# Patient Record
Sex: Female | Born: 2000 | Race: Black or African American | Hispanic: No | Marital: Single | Smoking: Never smoker
Health system: Southern US, Community
[De-identification: ages and names within clinical notes are randomized; demographics above are authoritative.]

## PROBLEM LIST (undated history)

## (undated) DIAGNOSIS — D571 Sickle-cell disease without crisis: Secondary | ICD-10-CM

## (undated) DIAGNOSIS — C801 Malignant (primary) neoplasm, unspecified: Secondary | ICD-10-CM

## (undated) DIAGNOSIS — J9859 Other diseases of mediastinum, not elsewhere classified: Secondary | ICD-10-CM

## (undated) DIAGNOSIS — R162 Hepatomegaly with splenomegaly, not elsewhere classified: Secondary | ICD-10-CM

## (undated) DIAGNOSIS — B54 Unspecified malaria: Secondary | ICD-10-CM

## (undated) HISTORY — DX: Other diseases of mediastinum, not elsewhere classified: J98.59

## (undated) HISTORY — PX: HERNIA REPAIR: SHX51

## (undated) HISTORY — PX: CHOLECYSTECTOMY: SHX55

## (undated) HISTORY — DX: Malignant (primary) neoplasm, unspecified: C80.1

## (undated) HISTORY — DX: Unspecified malaria: B54

## (undated) HISTORY — PX: DG GALL BLADDER: HXRAD326

## (undated) HISTORY — PX: SPLENECTOMY, TOTAL: SHX788

## (undated) HISTORY — PX: SPLENECTOMY: SUR1306

## (undated) HISTORY — DX: Hepatomegaly with splenomegaly, not elsewhere classified: R16.2

---
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
## 2021-02-01 DIAGNOSIS — R109 Unspecified abdominal pain: Secondary | ICD-10-CM | POA: Diagnosis not present

## 2021-02-01 DIAGNOSIS — K5900 Constipation, unspecified: Secondary | ICD-10-CM | POA: Diagnosis not present

## 2021-02-01 DIAGNOSIS — G43909 Migraine, unspecified, not intractable, without status migrainosus: Secondary | ICD-10-CM | POA: Diagnosis not present

## 2021-02-01 DIAGNOSIS — G8929 Other chronic pain: Secondary | ICD-10-CM | POA: Diagnosis not present

## 2021-02-01 DIAGNOSIS — D571 Sickle-cell disease without crisis: Secondary | ICD-10-CM | POA: Diagnosis not present

## 2021-02-01 DIAGNOSIS — E559 Vitamin D deficiency, unspecified: Secondary | ICD-10-CM | POA: Diagnosis not present

## 2021-03-08 ENCOUNTER — Inpatient Hospital Stay (HOSPITAL_COMMUNITY)
Admission: EM | Admit: 2021-03-08 | Discharge: 2021-03-13 | DRG: 812 | Disposition: A | Discharge status: Home / Self Care | Payer: Medicaid Other | Attending: Internal Medicine | Admitting: Internal Medicine

## 2021-03-08 ENCOUNTER — Emergency Department (HOSPITAL_COMMUNITY): Payer: Medicaid Other

## 2021-03-08 ENCOUNTER — Encounter (HOSPITAL_COMMUNITY): Payer: Self-pay

## 2021-03-08 DIAGNOSIS — Z9049 Acquired absence of other specified parts of digestive tract: Secondary | ICD-10-CM

## 2021-03-08 DIAGNOSIS — Z20822 Contact with and (suspected) exposure to covid-19: Secondary | ICD-10-CM | POA: Diagnosis present

## 2021-03-08 DIAGNOSIS — Z9081 Acquired absence of spleen: Secondary | ICD-10-CM

## 2021-03-08 DIAGNOSIS — D5700 Hb-SS disease with crisis, unspecified: Principal | ICD-10-CM | POA: Diagnosis present

## 2021-03-08 DIAGNOSIS — R079 Chest pain, unspecified: Secondary | ICD-10-CM | POA: Diagnosis not present

## 2021-03-08 DIAGNOSIS — B9620 Unspecified Escherichia coli [E. coli] as the cause of diseases classified elsewhere: Secondary | ICD-10-CM

## 2021-03-08 DIAGNOSIS — D638 Anemia in other chronic diseases classified elsewhere: Secondary | ICD-10-CM

## 2021-03-08 DIAGNOSIS — I517 Cardiomegaly: Secondary | ICD-10-CM | POA: Diagnosis not present

## 2021-03-08 DIAGNOSIS — Z79899 Other long term (current) drug therapy: Secondary | ICD-10-CM

## 2021-03-08 DIAGNOSIS — D72829 Elevated white blood cell count, unspecified: Secondary | ICD-10-CM

## 2021-03-08 DIAGNOSIS — R0789 Other chest pain: Secondary | ICD-10-CM | POA: Diagnosis not present

## 2021-03-08 DIAGNOSIS — M549 Dorsalgia, unspecified: Secondary | ICD-10-CM | POA: Diagnosis not present

## 2021-03-08 DIAGNOSIS — R000 Tachycardia, unspecified: Secondary | ICD-10-CM | POA: Diagnosis not present

## 2021-03-08 DIAGNOSIS — D571 Sickle-cell disease without crisis: Secondary | ICD-10-CM | POA: Diagnosis present

## 2021-03-08 DIAGNOSIS — R059 Cough, unspecified: Secondary | ICD-10-CM | POA: Diagnosis not present

## 2021-03-08 DIAGNOSIS — M545 Low back pain, unspecified: Secondary | ICD-10-CM | POA: Diagnosis not present

## 2021-03-08 DIAGNOSIS — N390 Urinary tract infection, site not specified: Secondary | ICD-10-CM

## 2021-03-08 LAB — RETICULOCYTES
Immature Retic Fract: 29.9 % — ABNORMAL HIGH (ref 2.3–15.9)
RBC.: 1.95 MIL/uL — ABNORMAL LOW (ref 3.87–5.11)
Retic Count, Absolute: 276.1 10*3/uL — ABNORMAL HIGH (ref 19.0–186.0)
Retic Ct Pct: 14.2 % — ABNORMAL HIGH (ref 0.4–3.1)

## 2021-03-08 LAB — COMPREHENSIVE METABOLIC PANEL
ALT: 13 U/L (ref 0–44)
AST: 43 U/L — ABNORMAL HIGH (ref 15–41)
Albumin: 4.1 g/dL (ref 3.5–5.0)
Alkaline Phosphatase: 82 U/L (ref 38–126)
Anion gap: 7 (ref 5–15)
BUN: <5 mg/dL — ABNORMAL LOW (ref 6–20)
CO2: 19 mmol/L — ABNORMAL LOW (ref 22–32)
Calcium: 8.8 mg/dL — ABNORMAL LOW (ref 8.9–10.3)
Chloride: 112 mmol/L — ABNORMAL HIGH (ref 98–111)
Creatinine, Ser: 0.41 mg/dL — ABNORMAL LOW (ref 0.44–1.00)
GFR, Estimated: >60 mL/min (ref >60)
Glucose, Bld: 112 mg/dL — ABNORMAL HIGH (ref 70–99)
Potassium: 3.8 mmol/L (ref 3.5–5.1)
Sodium: 138 mmol/L (ref 135–145)
Total Bilirubin: 2.1 mg/dL — ABNORMAL HIGH (ref 0.3–1.2)
Total Protein: 7.6 g/dL (ref 6.5–8.1)

## 2021-03-08 LAB — CBC WITH DIFFERENTIAL/PLATELET
Abs Immature Granulocytes: 0.24 10*3/uL — ABNORMAL HIGH (ref 0.00–0.07)
Basophils Absolute: 0.1 10*3/uL (ref 0.0–0.1)
Basophils Relative: 0 %
Eosinophils Absolute: 0 10*3/uL (ref 0.0–0.5)
Eosinophils Relative: 0 %
HCT: 16.8 % — ABNORMAL LOW (ref 36.0–46.0)
Hemoglobin: 6 g/dL — CL (ref 12.0–15.0)
Immature Granulocytes: 1 %
Lymphocytes Relative: 9 %
Lymphs Abs: 1.7 10*3/uL (ref 0.7–4.0)
MCH: 30.8 pg (ref 26.0–34.0)
MCHC: 35.7 g/dL (ref 30.0–36.0)
MCV: 86.2 fL (ref 80.0–100.0)
Monocytes Absolute: 1.2 10*3/uL — ABNORMAL HIGH (ref 0.1–1.0)
Monocytes Relative: 6 %
Neutro Abs: 15.5 10*3/uL — ABNORMAL HIGH (ref 1.7–7.7)
Neutrophils Relative %: 84 %
Platelets: 419 10*3/uL — ABNORMAL HIGH (ref 150–400)
RBC: 1.95 MIL/uL — ABNORMAL LOW (ref 3.87–5.11)
RDW: 27 % — ABNORMAL HIGH (ref 11.5–15.5)
WBC: 18.7 10*3/uL — ABNORMAL HIGH (ref 4.0–10.5)
nRBC: 3.2 % — ABNORMAL HIGH (ref 0.0–0.2)

## 2021-03-08 LAB — RESP PANEL BY RT-PCR (FLU A&B, COVID) ARPGX2
Influenza A by PCR: NEGATIVE
Influenza B by PCR: NEGATIVE
SARS Coronavirus 2 by RT PCR: NEGATIVE

## 2021-03-08 LAB — I-STAT BETA HCG BLOOD, ED (MC, WL, AP ONLY): I-stat hCG, quantitative: <5 m[IU]/mL (ref <5)

## 2021-03-08 MED ORDER — MORPHINE SULFATE (PF) 4 MG/ML IV SOLN
8.0000 mg | Freq: Once | INTRAVENOUS | Status: AC
Start: 2021-03-08 — End: 2021-03-08
  Administered 2021-03-08: 8 mg via INTRAVENOUS
  Filled 2021-03-08: qty 2

## 2021-03-08 MED ORDER — MORPHINE SULFATE (PF) 4 MG/ML IV SOLN
8.0000 mg | Freq: Once | INTRAVENOUS | Status: AC
  Administered 2021-03-08: 8 mg via INTRAVENOUS
  Filled 2021-03-08: qty 2

## 2021-03-08 MED ORDER — MORPHINE SULFATE (PF) 4 MG/ML IV SOLN
6.0000 mg | INTRAVENOUS | Status: AC
  Administered 2021-03-08: 6 mg via INTRAVENOUS
  Filled 2021-03-08: qty 2

## 2021-03-08 MED ORDER — ONDANSETRON HCL 4 MG/2ML IJ SOLN
4.0000 mg | INTRAMUSCULAR | Status: DC | PRN
  Administered 2021-03-08: 4 mg via INTRAVENOUS
  Filled 2021-03-08: qty 2

## 2021-03-08 MED ORDER — MORPHINE SULFATE (PF) 4 MG/ML IV SOLN
8.0000 mg | INTRAVENOUS | Status: AC
  Administered 2021-03-08: 8 mg via INTRAVENOUS
  Filled 2021-03-08: qty 2

## 2021-03-08 MED ORDER — DEXTROSE-NACL 5-0.45 % IV SOLN: INTRAVENOUS | Status: DC

## 2021-03-08 MED ORDER — SODIUM CHLORIDE 0.9 % IV SOLN
12.5000 mg | Freq: Once | INTRAVENOUS | Status: AC
  Administered 2021-03-08: 12.5 mg via INTRAVENOUS
  Filled 2021-03-08: qty 12.5

## 2021-03-08 NOTE — ED Notes (Signed)
Date and time results received: 03/08/21 2251 (use smartphrase ".now" to insert current time)  Test: hemoglobin Critical Value: 6.0  Name of Provider Notified: Dr. Sherry Ruffing  Orders Received? Or Actions Taken?: no/na

## 2021-03-08 NOTE — ED Provider Notes (Signed)
[facility] DEPT Provider Note   CSN: [id number] Arrival date & time: 03/08/21  1818     History  Chief Complaint  Patient presents with   Sickle Cell Pain Crisis    Alicia Villegas is a 21 y.o. female.  The history is provided by the patient and medical records. No language interpreter was used.  Sickle Cell Pain Crisis Location:  Chest and back Severity:  Severe Onset quality:  Gradual Duration:  2 days Similar to previous crisis episodes: yes   Timing:  Constant Progression:  Worsening Chronicity:  Recurrent Relieved by:  Nothing Worsened by:  Nothing Ineffective treatments:  None tried Associated symptoms: chest pain   Associated symptoms: no congestion, no cough, no fatigue, no fever, no headaches, no nausea, no shortness of breath, no vomiting and no wheezing       Home Medications Prior to Admission medications   Medication Sig Start Date End Date Taking? Authorizing Provider  acetaminophen (TYLENOL) 500 MG tablet Take by mouth. 03/29/20   [provider]  Cholecalciferol 25 MCG (1000 UT) capsule Take by mouth. 03/04/20   [provider]  dicyclomine (BENTYL) 10 MG capsule Take by mouth. 01/11/20   [provider]  ergocalciferol (VITAMIN D2) 1.25 MG (50000 UT) capsule Take by mouth. 03/28/20   [provider]  fluconazole (DIFLUCAN) 150 MG tablet Take 1 tablet (150 mg total) by mouth daily. 05/09/20   Daisy Floro, DO  hydroxyurea (HYDREA) 100 mg/mL SUSP Take 1,000 mg by mouth every evening.    [provider]  hydroxyurea (HYDREA) oral suspension 100 mg/ml mixture Take by mouth. 03/27/20   [provider]  Lactobacillus Rhamnosus, GG, (CULTURELLE KIDS) CHEW Chew by mouth. 10/31/20   [provider]  penicillin v potassium (VEETID) 250 MG tablet Take 250 mg by mouth 2 (two) times daily. 02/08/16   [provider]  propranolol (INDERAL) 10 MG tablet Take 10 mg by mouth 2  (two) times daily. 01/06/20   [provider]  voxelotor (OXBRYTA) 500 MG TABS tablet Take 3 tablets by mouth daily. 10/19/20   [provider]      Allergies    Patient has no known allergies.    Review of Systems   Review of Systems  Constitutional:  Negative for chills, diaphoresis, fatigue and fever.  HENT:  Negative for congestion.   Respiratory:  Negative for cough, choking, chest tightness, shortness of breath and wheezing.   Cardiovascular:  Positive for chest pain. Negative for palpitations.  Gastrointestinal:  Negative for abdominal pain, constipation, diarrhea, nausea and vomiting.  Genitourinary:  Negative for dysuria and flank pain.  Musculoskeletal:  Positive for back pain. Negative for neck pain and neck stiffness.  Skin:  Negative for rash and wound.  Neurological:  Negative for light-headedness and headaches.  Psychiatric/Behavioral:  Negative for agitation and confusion.   All other systems reviewed and are negative.  Physical Exam Updated Vital Signs BP (!) 140/94 (BP Location: Right Arm)    Pulse (!) 111    Temp 99 F (37.2 C) (Oral)    Resp 18    SpO2 100%  Physical Exam Vitals and nursing note reviewed.  Constitutional:      General: She is not in acute distress.    Appearance: She is well-developed. She is not ill-appearing, toxic-appearing or diaphoretic.  HENT:     Head: Normocephalic and atraumatic.     Nose: No congestion.     Mouth/Throat:  Mouth: Mucous membranes are moist.     Pharynx: No oropharyngeal exudate.  Eyes:     Conjunctiva/sclera: Conjunctivae normal.     Pupils: Pupils are equal, round, and reactive to light.  Cardiovascular:     Rate and Rhythm: Regular rhythm. Tachycardia present.     Heart sounds: No murmur heard. Pulmonary:     Effort: Pulmonary effort is normal. No respiratory distress.     Breath sounds: Normal breath sounds. No wheezing, rhonchi or rales.  Chest:     Chest wall: Tenderness present.   Abdominal:     General: Abdomen is flat.     Palpations: Abdomen is soft.     Tenderness: There is no abdominal tenderness. There is no right CVA tenderness, left CVA tenderness, guarding or rebound.  Musculoskeletal:        General: Tenderness present. No swelling.     Cervical back: Neck supple. No tenderness.  Skin:    General: Skin is warm and dry.     Capillary Refill: Capillary refill takes less than 2 seconds.     Findings: No erythema or rash.  Neurological:     General: No focal deficit present.     Mental Status: She is alert.     Sensory: No sensory deficit.     Motor: No weakness.  Psychiatric:        Mood and Affect: Mood normal.    ED Results / Procedures / Treatments   Labs (all labs ordered are listed, but only abnormal results are displayed) Labs Reviewed  CBC WITH DIFFERENTIAL/PLATELET - Abnormal; Notable for the following components:      Result Value   WBC 18.7 (*)    RBC 1.95 (*)    Hemoglobin 6.0 (*)    HCT 16.8 (*)    RDW 27.0 (*)    Platelets 419 (*)    nRBC 3.2 (*)    Neutro Abs 15.5 (*)    Monocytes Absolute 1.2 (*)    Abs Immature Granulocytes 0.24 (*)    All other components within normal limits  RETICULOCYTES - Abnormal; Notable for the following components:   Retic Ct Pct 14.2 (*)    RBC. 1.95 (*)    Retic Count, Absolute 276.1 (*)    Immature Retic Fract 29.9 (*)    All other components within normal limits  COMPREHENSIVE METABOLIC PANEL - Abnormal; Notable for the following components:   Chloride 112 (*)    CO2 19 (*)    Glucose, Bld 112 (*)    BUN <5 (*)    Creatinine, Ser 0.41 (*)    Calcium 8.8 (*)    AST 43 (*)    Total Bilirubin 2.1 (*)    All other components within normal limits  RESP PANEL BY RT-PCR (FLU A&B, COVID) ARPGX2  URINE CULTURE  CBC WITH DIFFERENTIAL/PLATELET  URINALYSIS, ROUTINE W REFLEX MICROSCOPIC  I-STAT BETA HCG BLOOD, ED ([facility], [facility], [facility] ONLY)    EKG EKG Interpretation  Date/Time:  Wednesday March 08 2021 20:32:40 EST Ventricular Rate:  103 PR Interval:  173 QRS Duration: 81 QT Interval:  330 QTC Calculation: 432 R Axis:   76 Text Interpretation: Sinus tachycardia when compared to prior, faster rate. No STEMI Confirmed by Antony Blackbird [phone]) on 03/08/2021 9:38:02 PM  Radiology DG Chest 2 View  Result Date: 03/08/2021 CLINICAL DATA:  Cough, sickle cell. EXAM: CHEST - 2 VIEW COMPARISON:  Chest x-ray 02/21/2018. FINDINGS: Heart is mildly enlarged, unchanged. The lungs are  clear. There is no pleural effusion or pneumothorax identified. The osseous structures are within normal limits. IMPRESSION: 1. No acute cardiopulmonary process. 2. Stable cardiomegaly. Electronically Signed   By: Ronney Asters M.D.   On: 03/08/2021 19:36    Procedures Procedures    Medications Ordered in ED Medications  ondansetron (ZOFRAN) injection 4 mg (4 mg Intravenous Given 03/08/21 2000)  dextrose 5 %-0.45 % sodium chloride infusion ( Intravenous New Bag/Given 03/08/21 2035)  morphine (PF) 4 MG/ML injection 8 mg (has no administration in time range)  morphine (PF) 4 MG/ML injection 6 mg (6 mg Intravenous Given 03/08/21 1959)  morphine (PF) 4 MG/ML injection 8 mg (8 mg Intravenous Given 03/08/21 2035)  diphenhydrAMINE (BENADRYL) 12.5 mg in sodium chloride 0.9 % 50 mL IVPB (0 mg Intravenous Stopped 03/08/21 2027)  morphine (PF) 4 MG/ML injection 8 mg (8 mg Intravenous Given 03/08/21 2134)    ED Course/ Medical Decision Making/ A&P                           Medical Decision Making Amount and/or Complexity of Data Reviewed Labs: ordered.  Risk Prescription drug management. Decision regarding hospitalization.   Alicia Villegas is a 21 y.o. female with a past medical history significant for sickle cell anemia, previous malaria, previous mediastinal mass, and migraines who presents with pain.  According to patient, she has had pain in her low back and her chest for the last few days consistent with previous sickle  cell pain crisis.  She reports it has been several months that she has had knee pain.  She thinks it could be related to the temperature and environmental changes recently.  She says that her medications have not been helping and she is having 10 out of 10 pain in her low back.  She reports the pain in her chest is only 4 out of 10.  She denies any history of DVT, PE, and reports does not feel like a pneumonia.  She denies any congestion, cough.  She denies any fevers or chills.  She was unable to answer about any sick contacts.  She denies any constipation, diarrhea, or urinary changes although she is having pain in the bilateral flanks and low back.  She reports he is on her menstrual cycle currently and does not report any pelvic pains.  No nausea or vomiting reported.  On exam, patient is clearly uncomfortable.  She has tenderness across her low back and some mild tenderness on the chest.  Lungs were clear and no murmur present.  Good pulses in extremities.  Legs are nontender and nonedematous.  She is slightly tachycardic but oxygen saturations are in the 90s.  She is afebrile but temperature was 99 orally on arrival.  Patient reports that this feels similar to previous pain crises and she does not think something else is triggering it or causing at this time.  However, given the pain in the chest, will get chest x-ray and will get urinalysis to look for a UTI as the cause of the back pains.  We will give pain medicine and patient reports that morphine typically helps her.  We will also get the screening sickle cell blood work.  Have a lower suspicion for thromboembolic etiology based on her description of symptoms and history.  Anticipate reassessment after work-up to determine disposition.  11:05 PM Work-up has continued to return.  Patient says that she urinated but it does not appear to  been collected.  We will try to make sure we get a urine sample sent although she denies urinary symptoms.   Patient's temperature is 99 but COVID and flu test were negative.  X-ray does not show pneumonia.  Oxygen saturations have for the most part remained above 90 although there was one value in the 80s which patient was denying shortness of breath.  Oxygen saturations are normal now.  Labs began to return and patient does have decreasing hemoglobin down to 6.0.  Previous admission shows that she has been between 6 and 7 in the past although this is on the lower side normal for her.  Patient is still having uncontrolled pain despite several doses of morphine.  Had a shared decision made conversation with her and she does not feel safe going home at this time.  She has not had to be admitted since last year.  We will call for admission for sickle cell pain crisis while still awaiting urinalysis.        Final Clinical Impression(s) / ED Diagnoses Final diagnoses:  Sickle cell pain crisis ([name])     Clinical Impression: 1. Sickle cell pain crisis ([place])     Disposition: Admit  This note was prepared with assistance of Dragon voice recognition software. Occasional wrong-word or sound-a-like substitutions may have occurred due to the inherent limitations of voice recognition software.      Babygirl Trager, Gwenyth Allegra, MD 03/08/21 2325

## 2021-03-08 NOTE — ED Provider Triage Note (Signed)
Emergency Medicine Provider Triage Evaluation Note  Alicia Villegas , a 21 y.o. female  was evaluated in triage.  Pt complains of lower back pain.  Denies chance of pregnancy.  No dysuria, hematuria.  Located in center of her back.  No recent falls or injuries.  Also does states she has a cough, taking her home sickle cell meds without relief.  Denies any overt chest pain, shortness of breath, lower extremity swelling.  No paresthesias or weakness.  Review of Systems  Positive: Sickle cell crisis, low back pain, cough Negative: fever  Physical Exam  There were no vitals taken for this visit. Gen:   Awake, no distress   Resp:  Normal effort  MSK:   Moves extremities without difficulty Other:    Medical Decision Making  Medically screening exam initiated at 6:25 PM.  Appropriate orders placed.  Alicia Villegas was informed that the remainder of the evaluation will be completed by another provider, this initial triage assessment does not replace that evaluation, and the importance of remaining in the ED until their evaluation is complete.  History of sickle cell, low back pain, cough   Ryen Rhames A, PA-C 03/08/21 1826

## 2021-03-08 NOTE — ED Triage Notes (Signed)
Pt arrived via EMS, from home, [facility]. Lower back pain.

## 2021-03-09 ENCOUNTER — Other Ambulatory Visit: Payer: Self-pay

## 2021-03-09 ENCOUNTER — Encounter (HOSPITAL_COMMUNITY): Payer: Self-pay | Admitting: Internal Medicine

## 2021-03-09 DIAGNOSIS — Z9049 Acquired absence of other specified parts of digestive tract: Secondary | ICD-10-CM | POA: Diagnosis not present

## 2021-03-09 DIAGNOSIS — R059 Cough, unspecified: Secondary | ICD-10-CM | POA: Diagnosis not present

## 2021-03-09 DIAGNOSIS — Z20822 Contact with and (suspected) exposure to covid-19: Secondary | ICD-10-CM | POA: Diagnosis not present

## 2021-03-09 DIAGNOSIS — Z79899 Other long term (current) drug therapy: Secondary | ICD-10-CM | POA: Diagnosis not present

## 2021-03-09 DIAGNOSIS — R000 Tachycardia, unspecified: Secondary | ICD-10-CM | POA: Diagnosis not present

## 2021-03-09 DIAGNOSIS — D5700 Hb-SS disease with crisis, unspecified: Secondary | ICD-10-CM | POA: Diagnosis not present

## 2021-03-09 DIAGNOSIS — N390 Urinary tract infection, site not specified: Secondary | ICD-10-CM | POA: Diagnosis not present

## 2021-03-09 DIAGNOSIS — D571 Sickle-cell disease without crisis: Secondary | ICD-10-CM | POA: Diagnosis present

## 2021-03-09 DIAGNOSIS — I517 Cardiomegaly: Secondary | ICD-10-CM | POA: Diagnosis not present

## 2021-03-09 DIAGNOSIS — Z9081 Acquired absence of spleen: Secondary | ICD-10-CM | POA: Diagnosis not present

## 2021-03-09 DIAGNOSIS — D638 Anemia in other chronic diseases classified elsewhere: Secondary | ICD-10-CM | POA: Diagnosis not present

## 2021-03-09 DIAGNOSIS — B9620 Unspecified Escherichia coli [E. coli] as the cause of diseases classified elsewhere: Secondary | ICD-10-CM | POA: Diagnosis not present

## 2021-03-09 LAB — PREPARE RBC (CROSSMATCH)

## 2021-03-09 LAB — HEMOGLOBIN AND HEMATOCRIT, BLOOD
HCT: 21.9 % — ABNORMAL LOW (ref 36.0–46.0)
Hemoglobin: 7.8 g/dL — ABNORMAL LOW (ref 12.0–15.0)

## 2021-03-09 LAB — URINALYSIS, ROUTINE W REFLEX MICROSCOPIC
Bilirubin Urine: NEGATIVE
Glucose, UA: NEGATIVE mg/dL
Ketones, ur: NEGATIVE mg/dL
Nitrite: NEGATIVE
Protein, ur: NEGATIVE mg/dL
Specific Gravity, Urine: 1.02 (ref 1.005–1.030)
pH: 6 (ref 5.0–8.0)

## 2021-03-09 LAB — URINALYSIS, MICROSCOPIC (REFLEX)

## 2021-03-09 LAB — HIV ANTIBODY (ROUTINE TESTING W REFLEX): HIV Screen 4th Generation wRfx: NONREACTIVE

## 2021-03-09 MED ORDER — HYDROXYUREA 100 MG/ML ORAL SUSPENSION
360.0000 mg | Freq: Every day | ORAL | Status: DC
Start: 2021-03-09 — End: 2021-03-09

## 2021-03-09 MED ORDER — MORPHINE SULFATE (PF) 4 MG/ML IV SOLN: 4.0000 mg | INTRAVENOUS | Status: DC | PRN

## 2021-03-09 MED ORDER — MORPHINE SULFATE (PF) 4 MG/ML IV SOLN
4.0000 mg | INTRAVENOUS | Status: DC | PRN
  Administered 2021-03-09 (×2): 4 mg via INTRAVENOUS
  Filled 2021-03-09 (×2): qty 1

## 2021-03-09 MED ORDER — MORPHINE SULFATE (PF) 4 MG/ML IV SOLN
4.0000 mg | INTRAVENOUS | Status: DC | PRN
  Administered 2021-03-09: 6 mg via INTRAVENOUS
  Administered 2021-03-09: 4 mg via INTRAVENOUS
  Filled 2021-03-09 (×2): qty 2

## 2021-03-09 MED ORDER — SODIUM CHLORIDE 0.9% IV SOLUTION: Freq: Once | INTRAVENOUS | Status: DC

## 2021-03-09 MED ORDER — VOXELOTOR 500 MG PO TABS
3.0000 | ORAL_TABLET | Freq: Every day | ORAL | Status: DC
  Administered 2021-03-09 – 2021-03-13 (×5): 1500 mg via ORAL

## 2021-03-09 MED ORDER — NALOXONE HCL 0.4 MG/ML IJ SOLN: 0.4000 mg | INTRAMUSCULAR | Status: DC | PRN

## 2021-03-09 MED ORDER — MORPHINE SULFATE 1 MG/ML IV SOLN PCA
INTRAVENOUS | Status: DC
  Administered 2021-03-10 (×2): 0 mg via INTRAVENOUS
  Administered 2021-03-11: 2 mg via INTRAVENOUS
  Filled 2021-03-09: qty 30

## 2021-03-09 MED ORDER — ACETAMINOPHEN 325 MG PO TABS
650.0000 mg | ORAL_TABLET | ORAL | Status: DC | PRN
  Administered 2021-03-09 – 2021-03-13 (×9): 650 mg via ORAL
  Filled 2021-03-09 (×9): qty 2

## 2021-03-09 MED ORDER — ENOXAPARIN SODIUM 40 MG/0.4ML IJ SOSY
40.0000 mg | PREFILLED_SYRINGE | INTRAMUSCULAR | Status: DC
  Administered 2021-03-10 – 2021-03-11 (×2): 40 mg via SUBCUTANEOUS
  Filled 2021-03-09 (×4): qty 0.4

## 2021-03-09 MED ORDER — HYDROXYUREA 100 MG/ML ORAL SUSPENSION: 20.0000 mg/kg | Freq: Every day | ORAL | Status: DC

## 2021-03-09 MED ORDER — POLYETHYLENE GLYCOL 3350 17 G PO PACK: 17.0000 g | PACK | Freq: Every day | ORAL | Status: DC | PRN

## 2021-03-09 MED ORDER — ONDANSETRON HCL 4 MG/2ML IJ SOLN: 4.0000 mg | Freq: Four times a day (QID) | INTRAMUSCULAR | Status: DC | PRN

## 2021-03-09 MED ORDER — ENSURE ENLIVE PO LIQD
237.0000 mL | Freq: Two times a day (BID) | ORAL | Status: DC
  Administered 2021-03-10: 237 mL via ORAL

## 2021-03-09 MED ORDER — SENNOSIDES-DOCUSATE SODIUM 8.6-50 MG PO TABS
1.0000 | ORAL_TABLET | Freq: Two times a day (BID) | ORAL | Status: DC
  Administered 2021-03-09 – 2021-03-13 (×6): 1 via ORAL
  Filled 2021-03-09 (×6): qty 1

## 2021-03-09 MED ORDER — SODIUM CHLORIDE 0.9% FLUSH: 9.0000 mL | INTRAVENOUS | Status: DC | PRN

## 2021-03-09 MED ORDER — KETOROLAC TROMETHAMINE 15 MG/ML IJ SOLN
15.0000 mg | Freq: Four times a day (QID) | INTRAMUSCULAR | Status: DC
  Administered 2021-03-09 (×2): 15 mg via INTRAVENOUS
  Filled 2021-03-09 (×4): qty 1

## 2021-03-09 MED ORDER — SODIUM CHLORIDE 0.45 % IV SOLN
INTRAVENOUS | Status: DC
Start: 2021-03-09 — End: 2021-03-09

## 2021-03-09 MED ORDER — HYDROXYUREA 100 MG/ML ORAL SUSPENSION
1200.0000 mg | Freq: Every day | ORAL | Status: DC
  Administered 2021-03-10 – 2021-03-13 (×4): 1200 mg via ORAL

## 2021-03-09 MED ORDER — SODIUM CHLORIDE 0.9 % IV SOLN
25.0000 mg | INTRAVENOUS | Status: DC | PRN
  Filled 2021-03-09: qty 0.5

## 2021-03-09 MED ORDER — DIPHENHYDRAMINE HCL 25 MG PO CAPS: 25.0000 mg | ORAL_CAPSULE | ORAL | Status: DC | PRN

## 2021-03-09 NOTE — ED Notes (Signed)
Pt c/o leg weakness while walking to restroom. Only able to stand on own for few second

## 2021-03-09 NOTE — H&P (Signed)
History and Physical    Patient: Alicia Villegas DOB: 24-Mar-2000 DOA: 03/08/2021 DOS: the patient was seen and examined on 03/09/2021 PCP: Dorena Dew, FNP  Patient coming from: Home  Chief Complaint:  Chief Complaint  Patient presents with   Sickle Cell Pain Crisis    HPI: Alicia Villegas is a 21 y.o. female with medical history significant of HGB SS disease, acute chest syndrome in Sept 2022, HGB at that time was 6.0, transfused at [facility].  Looks like her HGB usually runs > 7.  Pt presents to the ED with c/o chest and back pain.  Symptoms are similar to prior sickle cell crisis symptoms.  Symptoms are severe but not worse or different than prior sickle cell crises.  Denies SOB.  Is satting 90% on RA in the ED.  Review of Systems: As mentioned in the history of present illness. All other systems reviewed and are negative. 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 She has never used smokeless tobacco. She reports that she does not drink alcohol and does not use drugs.  No Known Allergies  Family History  Family history unknown: Yes    Prior to Admission medications   Medication Sig Start Date End Date Taking? Authorizing Provider  hydroxyurea (HYDREA) 100 mg/mL SUSP Take 1,200 mg by mouth daily.   Yes [provider]  voxelotor (OXBRYTA) 500 MG TABS tablet Take 3 tablets by mouth daily. 10/19/20  Yes [provider]    Physical Exam: Vitals:   03/09/21 0000 03/09/21 0022 03/09/21 0030 03/09/21 0100  BP: 114/76  127/61 111/73  Pulse: (!) 119  (!)  124 (!) 118  Resp: (!) 23  (!) 27 (!) 26  Temp:  99.1 F (37.3 C)    TempSrc:  Oral    SpO2: 95%  96% 96%   Constitutional: Pt uncomfortable / in pain Eyes: PERRL, lids and conjunctivae normal ENMT: Mucous membranes are moist. Posterior pharynx clear of any exudate or lesions.Normal dentition.  Neck: normal, supple, no masses, no thyromegaly Respiratory: clear to auscultation bilaterally, no wheezing, no crackles. Normal respiratory effort. No accessory muscle use.  Cardiovascular: Tachycardic Abdomen: no tenderness, no masses palpated. No hepatosplenomegaly. Bowel sounds positive.  Musculoskeletal: no clubbing / cyanosis. No joint deformity upper and lower extremities. Good ROM, no contractures. Normal muscle tone.  Skin: no rashes, lesions, ulcers. No induration Neurologic: CN 2-12 grossly intact. Sensation intact, DTR normal. Strength 5/5 in all 4.  Psychiatric: Normal judgment and insight. Alert and oriented x 3. Normal mood.    Data Reviewed:  WBC 18k, HGB 6.0, CXR neg for opacities / acute chest syndrome findings.  Assessment and Plan: * Sickle cell pain crisis ([place])- (present on admission) Pt tachy to 130s, anemia to 6.0, this is significantly lower than baseline.  Pt says symptoms are consistent with sickle cell crisis. Looks like they transfused her at [facility] in Sept 2022 when she was 6.0 although she did have acute chest at that time.  Will transfuse 2u PRBC. Sickle cell pathway Scheduled toradol PRN morphine until we can get her on morphine PCA Tele monitor given severe tachycardia. No evidence of infiltrate on CXR today IVF: will DC the D5half as I dont really want to dilute her any further (hoping they can find compatible blood shortly). Continue Oxbryta Hold hydrea given anemia H/H post transfusion.       Advance Care Planning:   Code Status: Full Code  Consults: None  Family Communication: No family in room  Severity of Illness: The appropriate patient  status for this patient is INPATIENT. Inpatient status is judged to be reasonable and necessary in order to provide the required intensity of service to ensure the patient's safety. The patient's presenting symptoms, physical exam findings, and initial radiographic and laboratory data in the context of their chronic comorbidities is felt to place them at high risk for further clinical deterioration. Furthermore, it is not anticipated that the patient will be medically stable for discharge from the hospital within 2 midnights of admission.   * I certify that at the point of admission it is my clinical judgment that the patient will require inpatient hospital care spanning beyond 2 midnights from the point of admission due to high intensity of service, high risk for further deterioration and high frequency of surveillance required.*  Author: Etta Quill., DO 03/09/2021 3:13 AM  For on call review [email or web address].

## 2021-03-09 NOTE — Assessment & Plan Note (Addendum)
Pt tachy to 130s, anemia to 6.0, this is significantly lower than baseline.  Pt says symptoms are consistent with sickle cell crisis. Looks like they transfused her at [facility] in Sept 2022 when she was 6.0 although she did have acute chest at that time. Will transfuse 2u PRBC. Sickle cell pathway Scheduled toradol PRN morphine until we can get her on morphine PCA Tele monitor given severe tachycardia. No evidence of infiltrate on CXR today IVF: will DC the D5half as I dont really want to dilute her any further (hoping they can find compatible blood shortly). Continue Oxbryta Hold hydrea given anemia H/H post transfusion.

## 2021-03-09 NOTE — Progress Notes (Signed)
Alicia Villegas is a 21 year old female with a medical history significant for sickle cell disease that was admitted overnight for sickle cell crisis.  Patient states that pain intensity increased suddenly and she was unable to control her with over-the-counter medications.  Currently, her pain intensity is 6/10.  She denies any headache, chest pain, urinary symptoms, nausea, vomiting, or diarrhea.  Care plan: Decrease morphine to 4 mg every 2 hours as needed while in the emergency department, will transition to PCA upon arrival to the unit. Will only transfuse 1 unit PRBCs and recheck CBC in AM.  Will reassess on 03/10/2021  [place], MSN, FNP-C Patient [place] [address] [place], [place] [address] [phone]

## 2021-03-09 NOTE — ED Notes (Signed)
Took home medications to pharmacy for dispensing. 87 tablets oxbryta and 364ml hydrea suspension. Pharmacy to confirm if hydrea stays in pharmacy or at bedside.

## 2021-03-09 NOTE — ED Notes (Signed)
Spoke with blood bank regarding pt's ordered units. Per blood bank, this is still in process.

## 2021-03-09 NOTE — ED Notes (Signed)
RN called Blood bank to check on units. She has many antibodies and sickler so may have to get it from [place].

## 2021-03-09 NOTE — ED Notes (Signed)
Pt received dinner tray.

## 2021-03-09 NOTE — ED Notes (Signed)
Per lab, pt has numerous antibodies and blood had to come from [place]. Recommended 1 unit of PRBCs and then check an H&H 2 hours post-transfusion. Will message MD.

## 2021-03-09 NOTE — ED Notes (Signed)
Pt ambulatory to bathroom w/ assist for safety

## 2021-03-09 NOTE — Plan of Care (Signed)
°  Problem: Education: Goal: Knowledge of General Education information will improve Description: Including pain rating scale, medication(s)/side effects and non-pharmacologic comfort measures Outcome: Progressing   Problem: Health Behavior/Discharge Planning: Goal: Ability to manage health-related needs will improve Outcome: Progressing   Problem: Clinical Measurements: Goal: Ability to maintain clinical measurements within normal limits will improve Outcome: Progressing Goal: Will remain free from infection Outcome: Progressing Goal: Diagnostic test results will improve Outcome: Progressing Goal: Respiratory complications will improve Outcome: Progressing   Problem: Pain Managment: Goal: General experience of comfort will improve Outcome: Progressing

## 2021-03-10 DIAGNOSIS — D638 Anemia in other chronic diseases classified elsewhere: Secondary | ICD-10-CM

## 2021-03-10 DIAGNOSIS — D72829 Elevated white blood cell count, unspecified: Secondary | ICD-10-CM

## 2021-03-10 DIAGNOSIS — N390 Urinary tract infection, site not specified: Secondary | ICD-10-CM

## 2021-03-10 DIAGNOSIS — B9620 Unspecified Escherichia coli [E. coli] as the cause of diseases classified elsewhere: Secondary | ICD-10-CM

## 2021-03-10 DIAGNOSIS — D5700 Hb-SS disease with crisis, unspecified: Secondary | ICD-10-CM | POA: Diagnosis not present

## 2021-03-10 LAB — BPAM RBC
Blood Product Expiration Date: 202303042359
ISSUE DATE / TIME: 202302021648
Unit Type and Rh: 9500

## 2021-03-10 LAB — TYPE AND SCREEN
ABO/RH(D): AB POS
Antibody Screen: POSITIVE
Unit division: 0

## 2021-03-10 MED ORDER — SODIUM CHLORIDE 0.9 % IV SOLN
1.0000 g | INTRAVENOUS | Status: DC
  Administered 2021-03-10 – 2021-03-11 (×2): 1 g via INTRAVENOUS
  Filled 2021-03-10 (×2): qty 10

## 2021-03-10 MED ORDER — ENSURE ENLIVE PO LIQD: 237.0000 mL | Freq: Two times a day (BID) | ORAL | Status: DC

## 2021-03-10 MED ORDER — BOOST / RESOURCE BREEZE PO LIQD CUSTOM
1.0000 | Freq: Two times a day (BID) | ORAL | Status: DC
  Administered 2021-03-10: 1 via ORAL

## 2021-03-10 NOTE — Progress Notes (Signed)
Initial Nutrition Assessment  DOCUMENTATION CODES:   Not applicable  INTERVENTION:  - continue Ensure Enlive BID, each supplement provides 350 kcal and 20 grams of protein. - will order Boost Breeze BID, each supplement provides 250 kcal and 9 grams of protein. - will order 1 tablet multivitamin with minerals/day. - complete NFPE when feasible.    NUTRITION DIAGNOSIS:   Inadequate oral intake related to acute illness as evidenced by per patient/family report.  GOAL:   Patient will meet greater than or equal to 90% of their needs  MONITOR:   PO intake, Supplement acceptance, Labs, Weight trends  REASON FOR ASSESSMENT:   Malnutrition Screening Tool  ASSESSMENT:   21 y.o. female with medical history sickle cell disease. She presented to the ED with chest and back pain and dx with sickle cell pain crisis.  Able to talk with RN prior to visit to patient's room. She shares that patient had not been up to having breakfast this AM.   Patient laying in bed with no visitors present at the time of RD visit.   Patient very soft spoken. She is not feeling up to eating or drinking at the time of RD visit and unable to recall the last time she ate. She has Ensure ordered BID and is agreeable to maintaining this order.  Weight yesterday was 109 lb and it appears that this weight was copied forward from 11/08/20. No information documented in the edema section of flow sheet.    Labs reviewed; Cl: 112 mmol/l, BUN: <5 mg/dl, creatinine: 0.41 mg/dl.  Medications reviewed; 1 tablet senokot BID.     NUTRITION - FOCUSED PHYSICAL EXAM:  Deferred d/t sickle cell pain crisis.   Diet Order:   Diet Order             Diet regular Room service appropriate? Yes; Fluid consistency: Thin  Diet effective now                   EDUCATION NEEDS:   No education needs have been identified at this time  Skin:  Skin Assessment: Reviewed RN Assessment  Last BM:  PTA/unknown  Height:    Ht Readings from Last 1 Encounters:  03/09/21 5\' 1"  (1.549 m) (10 %, Z= -1.29)*   * Growth percentiles are based on CDC (Girls, 2-20 Years) data.    Weight:   Wt Readings from Last 1 Encounters:  03/09/21 49.3 kg (14 %, Z= -1.08)*   * Growth percentiles are based on CDC (Girls, 2-20 Years) data.     BMI:  Body mass index is 20.54 kg/m.   Estimated Nutritional Needs:  Kcal:  1700-1950 kcal Protein:  85-100 grams Fluid:  >/= 2 L/day      Jarome Matin, MS, RD, LDN Inpatient Clinical Dietitian RD pager # available in [place]  After hours/weekend pager # available in [facility]

## 2021-03-10 NOTE — Progress Notes (Signed)
Subjective: Alicia Villegas is a 21 year old female with a medical history significant for sickle cell disease that was admitted for sickle cell crisis.  Patient continues to complain of low back pain.  She rates her pain as 6/10.  Urine culture yielded E. coli greater than 100,000 colonies.  Patient denies any urinary frequency, urgency, or hesitancy.  Patient febrile, maximum temperature 101 F. She denies any headache, dizziness, chest pain, shortness of breath, nausea, vomiting, or diarrhea.  Objective:  Vital signs in last 24 hours:  Vitals:   03/10/21 1211 03/10/21 1244 03/10/21 1318 03/10/21 1605  BP:   110/66   Pulse:   (!) 108   Resp: (!) 22  20 (!) 22  Temp:   98.8 F (37.1 C)   TempSrc:   Oral   SpO2: 95%  97% 97%  Weight:  48.9 kg    Height:        Intake/Output from previous day:   Intake/Output Summary (Last 24 hours) at 03/10/2021 1741 Last data filed at 03/09/2021 1932 Gross per 24 hour  Intake 355 ml  Output --  Net 355 ml    Physical Exam: General: Alert, awake, oriented x3, in no acute distress.  HEENT: /AT PEERL, EOMI Neck: Trachea midline,  no masses, no thyromegal,y no JVD, no carotid bruit OROPHARYNX:  Moist, No exudate/ erythema/lesions.  Heart: Regular rate and rhythm, without murmurs, rubs, gallops, PMI non-displaced, no heaves or thrills on palpation.  Lungs: Clear to auscultation, no wheezing or rhonchi noted. No increased vocal fremitus resonant to percussion  Abdomen: Soft, nontender, nondistended, positive bowel sounds, no masses no hepatosplenomegaly noted..  Neuro: No focal neurological deficits noted cranial nerves II through XII grossly intact. DTRs 2+ bilaterally upper and lower extremities. Strength 5 out of 5 in bilateral upper and lower extremities. Musculoskeletal: No warm swelling or erythema around joints, no spinal tenderness noted. Psychiatric: Patient alert and oriented x3, good insight and cognition, good recent to remote  recall. Lymph node survey: No cervical axillary or inguinal lymphadenopathy noted.  Lab Results:  Basic Metabolic Panel:    Component Value Date/Time   NA 138 03/08/2021 2205   NA 139 11/08/2020 1100   K 3.8 03/08/2021 2205   CL 112 (H) 03/08/2021 2205   CO2 19 (L) 03/08/2021 2205   BUN <5 (L) 03/08/2021 2205   BUN 5 (L) 11/08/2020 1100   CREATININE 0.41 (L) 03/08/2021 2205   CREATININE 0.30 (L) 05/02/2015 1442   GLUCOSE 112 (H) 03/08/2021 2205   CALCIUM 8.8 (L) 03/08/2021 2205   CBC:    Component Value Date/Time   WBC 18.7 (H) 03/08/2021 2205   HGB 7.8 (L) 03/09/2021 2205   HGB 7.7 (L) 11/08/2020 1100   HCT 21.9 (L) 03/09/2021 2205   HCT 23.2 (L) 11/08/2020 1100   PLT 419 (H) 03/08/2021 2205   PLT 665 (H) 11/08/2020 1100   MCV 86.2 03/08/2021 2205   MCV 91 11/08/2020 1100   NEUTROABS 15.5 (H) 03/08/2021 2205   NEUTROABS 4.6 11/08/2020 1100   LYMPHSABS 1.7 03/08/2021 2205   LYMPHSABS 3.0 11/08/2020 1100   MONOABS 1.2 (H) 03/08/2021 2205   EOSABS 0.0 03/08/2021 2205   EOSABS 0.1 11/08/2020 1100   BASOSABS 0.1 03/08/2021 2205   BASOSABS 0.1 11/08/2020 1100    Recent Results (from the past 240 hour(s))  Resp Panel by RT-PCR (Flu A&B, Covid) Nasopharyngeal Swab     Status: None   Collection Time: 03/08/21  9:37 PM   Specimen: Nasopharyngeal  Swab; Nasopharyngeal(NP) swabs in vial transport medium  Result Value Ref Range Status   SARS Coronavirus 2 by RT PCR NEGATIVE NEGATIVE Final    Comment: (NOTE) SARS-CoV-2 target nucleic acids are NOT DETECTED.  The SARS-CoV-2 RNA is generally detectable in upper respiratory specimens during the acute phase of infection. The lowest concentration of SARS-CoV-2 viral copies this assay can detect is 138 copies/mL. A negative result does not preclude SARS-Cov-2 infection and should not be used as the sole basis for treatment or other patient management decisions. A negative result may occur with  improper specimen  collection/handling, submission of specimen other than nasopharyngeal swab, presence of viral mutation(s) within the areas targeted by this assay, and inadequate number of viral copies(<138 copies/mL). A negative result must be combined with clinical observations, patient history, and epidemiological information. The expected result is Negative.  Fact Sheet for Patients:  EntrepreneurPulse.com.au  Fact Sheet for Healthcare Providers:  IncredibleEmployment.be  This test is no t yet approved or cleared by the [place] FDA and  has been authorized for detection and/or diagnosis of SARS-CoV-2 by FDA under an Emergency Use Authorization (EUA). This EUA will remain  in effect (meaning this test can be used) for the duration of the COVID-19 declaration under Section 564(b)(1) of the Act, 21 U.S.C.section 360bbb-3(b)(1), unless the authorization is terminated  or revoked sooner.       Influenza A by PCR NEGATIVE NEGATIVE Final   Influenza B by PCR NEGATIVE NEGATIVE Final    Comment: (NOTE) The Xpert Xpress SARS-CoV-2/FLU/RSV plus assay is intended as an aid in the diagnosis of influenza from Nasopharyngeal swab specimens and should not be used as a sole basis for treatment. Nasal washings and aspirates are unacceptable for Xpert Xpress SARS-CoV-2/FLU/RSV testing.  Fact Sheet for Patients: EntrepreneurPulse.com.au  Fact Sheet for Healthcare Providers: IncredibleEmployment.be  This test is not yet approved or cleared by the [place] FDA and has been authorized for detection and/or diagnosis of SARS-CoV-2 by FDA under an Emergency Use Authorization (EUA). This EUA will remain in effect (meaning this test can be used) for the duration of the COVID-19 declaration under Section 564(b)(1) of the Act, 21 U.S.C. section 360bbb-3(b)(1), unless the authorization is terminated or revoked.  Performed at [facility], [place] [address], [place], [place] [address]   Urine Culture     Status: Abnormal (Preliminary result)   Collection Time: 03/09/21 12:00 AM   Specimen: Urine, Clean Catch  Result Value Ref Range Status   Specimen Description   Final    URINE, CLEAN CATCH Performed at [facility], [place] [address], [place], [place] [address]    Special Requests   Final    NONE Performed at [facility], [place] [address] [place], [place] [address]    Culture (A)  Final    >=100,000 COLONIES/mL ESCHERICHIA COLI SUSCEPTIBILITIES TO FOLLOW Performed at [facility], [place] [address], [place], [place] [address]    Report Status PENDING  Incomplete    Studies/Results: DG Chest 2 View  Result Date: 03/08/2021 CLINICAL DATA:  Cough, sickle cell. EXAM: CHEST - 2 VIEW COMPARISON:  Chest x-ray 02/21/2018. FINDINGS: Heart is mildly enlarged, unchanged. The lungs are clear. There is no pleural effusion or pneumothorax identified. The osseous structures are within normal limits. IMPRESSION: 1. No acute cardiopulmonary process. 2. Stable cardiomegaly. Electronically Signed   By: Ronney Asters M.D.   On: 03/08/2021 19:36    Medications: Scheduled Meds:  sodium chloride   Intravenous Once   enoxaparin (LOVENOX) injection  40 mg Subcutaneous Q24H   feeding supplement  1 Container Oral BID BM   feeding supplement  237 mL Oral BID BM   hydroxyurea  1,200 mg Oral Daily   ketorolac  15 mg Intravenous Q6H   morphine   Intravenous Q4H   senna-docusate  1 tablet Oral BID   voxelotor  3 tablet Oral Daily   Continuous Infusions:  cefTRIAXone (ROCEPHIN)  IV 1 g (03/10/21 1220)   diphenhydrAMINE     PRN Meds:.acetaminophen, diphenhydrAMINE **OR** diphenhydrAMINE, naloxone **AND** sodium chloride flush, ondansetron (ZOFRAN) IV, polyethylene glycol  Consultants: none   Procedures: none  Antibiotics: IV ceftriaxone  Assessment/Plan: Principal  Problem:   Sickle cell pain crisis ([place]) Active Problems:   E. coli UTI (urinary tract infection)   Leukocytosis   Anemia of chronic disease  Sickle cell disease with pain crisis: IV fluid, 0.45% saline at 50 mL/h.  Continue IV morphine PCA, full dose.  Toradol 15 mg IV every 6 hours.  Monitor vital signs very closely, reevaluate pain scale regularly, and supplemental oxygen as needed  Anemia of chronic disease: Today, patient's hemoglobin is 7.8 g/dL, which is consistent with her baseline.  She is status post 1 unit PRBCs.  We will continue to follow closely.  Labs in AM.  Urinary tract infection, E. coli: Urine culture yielded E. coli 100,000 colonies.  IV ceftriaxone initiated.  We will continue to follow sensitivities.  Leukocytosis: WBCs 18.7.  Current CBC pending.  Patient febrile overnight, maximum temperature 101 F.  IV ceftriaxone initiated.  We will continue to follow closely.   Code Status: Full Code Family Communication: N/A Disposition Plan: Not yet ready for discharge  [place], MSN, FNP-C Patient [place] [address] [place], [place] [address] [phone]  If 7PM-7AM, please contact night-coverage.  03/10/2021, 5:41 PM  LOS: 1 day

## 2021-03-10 NOTE — Progress Notes (Signed)
Pt was feeling warm and was showing a temperature of 101 F. This put her at yellow MEWS. MD notified.

## 2021-03-11 DIAGNOSIS — D5700 Hb-SS disease with crisis, unspecified: Secondary | ICD-10-CM | POA: Diagnosis not present

## 2021-03-11 LAB — URINE CULTURE: Culture: >=100000 — AB

## 2021-03-11 LAB — CBC
HCT: 24.4 % — ABNORMAL LOW (ref 36.0–46.0)
Hemoglobin: 8.1 g/dL — ABNORMAL LOW (ref 12.0–15.0)
MCH: 29.5 pg (ref 26.0–34.0)
MCHC: 33.2 g/dL (ref 30.0–36.0)
MCV: 88.7 fL (ref 80.0–100.0)
Platelets: 439 10*3/uL — ABNORMAL HIGH (ref 150–400)
RBC: 2.75 MIL/uL — ABNORMAL LOW (ref 3.87–5.11)
RDW: 23.7 % — ABNORMAL HIGH (ref 11.5–15.5)
WBC: 12.7 10*3/uL — ABNORMAL HIGH (ref 4.0–10.5)
nRBC: 1.6 % — ABNORMAL HIGH (ref 0.0–0.2)

## 2021-03-11 MED ORDER — MORPHINE SULFATE (PF) 2 MG/ML IV SOLN: 2.0000 mg | INTRAVENOUS | Status: DC | PRN

## 2021-03-11 MED ORDER — CEFADROXIL 500 MG PO CAPS
1000.0000 mg | ORAL_CAPSULE | Freq: Two times a day (BID) | ORAL | Status: DC
  Administered 2021-03-12 – 2021-03-13 (×3): 1000 mg via ORAL
  Filled 2021-03-11 (×3): qty 2

## 2021-03-11 MED ORDER — OXYCODONE HCL 5 MG PO TABS: 5.0000 mg | ORAL_TABLET | ORAL | Status: DC | PRN

## 2021-03-11 NOTE — Progress Notes (Signed)
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 coli UTI (urinary tract infection)   Leukocytosis   Anemia of chronic disease  Sickle cell pain crisis: Patient is currently on morphine PCA but not using that much.  She is also refusing some current care.  At this point discussed with the patient.  She only uses 8 mg of the PCA in 24 hours.  I will discontinue the PCA.  I will start her on as needed pain medications with oral.  Continue to treat UTI. E. coli UTI: Continue empiric antibiotics.  May transition to oral agents today and hopefully discharge tomorrow if stable. Anemia of chronic disease: H&H appears stable.  Continue to monitor Leukocytosis: Secondary to UTI and sickle cell crisis.  Continue to monitor  Code Status: Full code Family Communication: Mother Disposition Plan: [place]  Pager [phone]. If 7PM-7AM, please contact night-coverage.  03/11/2021, 10:12 AM  LOS: 2 days   Brief narrative: Alicia Villegas is a 21 year old female with a medical history significant for sickle cell disease that was admitted for sickle cell crisis.   Patient continues to complain of low back pain.  She rates her pain as 6/10.  Urine culture yielded E. coli greater than 100,000 colonies.  Patient denies any urinary frequency, urgency, or hesitancy.  Patient febrile, maximum temperature 101 F. She denies any headache, dizziness, chest pain, shortness of breath, nausea, vomiting, or diarrhea.  Consultants: None  Procedures: None  Antibiotics: Rocephin  HPI/Subjective: Patient is refusing care occasionally.  Overall stable no complaint.  She is afebrile today.  Lightly depressed.  Oxygenation is good.  Pain is at 7 out of 10.  Objective: Vitals:   03/11/21 0521 03/11/21 0808 03/11/21 0854 03/11/21 1011  BP:  108/65  108/64 105/65  Pulse: (!) 106  84 86  Resp: 16 20 19    Temp: 100.2 F (37.9 C)  97.7 F (36.5 C) 97.7 F (36.5 C)  TempSrc: Oral  Oral Oral  SpO2: 97% (!) 7% 98%   Weight:      Height:       Weight change: -1.905 kg  Intake/Output Summary (Last 24 hours) at 03/11/2021 1012 Last data filed at 03/11/2021 0300 Gross per 24 hour  Intake 360 ml  Output --  Net 360 ml    General: Alert, awake, oriented x3, in no acute distress.  HEENT: [place]/AT PEERL, EOMI Neck: Trachea midline,  no masses, no thyromegal,y no JVD, no carotid bruit OROPHARYNX:  Moist, No exudate/ erythema/lesions.  Heart: Regular rate and rhythm, without murmurs, rubs, gallops, PMI non-displaced, no heaves or thrills on palpation.  Lungs: Clear to auscultation, no wheezing or rhonchi noted. No increased vocal fremitus resonant to percussion  Abdomen: Soft, nontender, nondistended, positive bowel sounds, no masses no hepatosplenomegaly noted..  Neuro: No focal neurological deficits noted cranial nerves II through XII grossly intact. DTRs 2+ bilaterally upper and lower extremities. Strength 5 out of 5 in bilateral upper and lower extremities. Musculoskeletal: No warm swelling or erythema around joints, no spinal tenderness noted. Psychiatric: Patient alert and oriented x3, good insight and cognition, good recent to remote recall. Lymph node survey: No cervical axillary or inguinal lymphadenopathy noted.   Data Reviewed: Basic Metabolic Panel: Recent Labs  Lab 03/08/21 2205  NA 138  K 3.8  CL 112*  CO2 19*  GLUCOSE  112*  BUN <5*  CREATININE 0.41*  CALCIUM 8.8*   Liver Function Tests: Recent Labs  Lab 03/08/21 2205  AST 43*  ALT 13  ALKPHOS 82  BILITOT 2.1*  PROT 7.6  ALBUMIN 4.1   No results for input(s): LIPASE, AMYLASE in the last 168 hours. No results for input(s): AMMONIA in the last 168 hours. CBC: Recent Labs  Lab 03/08/21 2205 03/09/21 2205 03/11/21 0612  WBC 18.7*  --  12.7*   NEUTROABS 15.5*  --   --   HGB 6.0* 7.8* 8.1*  HCT 16.8* 21.9* 24.4*  MCV 86.2  --  88.7  PLT 419*  --  439*   Cardiac Enzymes: No results for input(s): CKTOTAL, CKMB, CKMBINDEX, TROPONINI in the last 168 hours. BNP (last 3 results) No results for input(s): BNP in the last 8760 hours.  ProBNP (last 3 results) No results for input(s): PROBNP in the last 8760 hours.  CBG: No results for input(s): GLUCAP in the last 168 hours.  Recent Results (from the past 240 hour(s))  Resp Panel by RT-PCR (Flu A&B, Covid) Nasopharyngeal Swab     Status: None   Collection Time: 03/08/21  9:37 PM   Specimen: Nasopharyngeal Swab; Nasopharyngeal(NP) swabs in vial transport medium  Result Value Ref Range Status   SARS Coronavirus 2 by RT PCR NEGATIVE NEGATIVE Final    Comment: (NOTE) SARS-CoV-2 target nucleic acids are NOT DETECTED.  The SARS-CoV-2 RNA is generally detectable in upper respiratory specimens during the acute phase of infection. The lowest concentration of SARS-CoV-2 viral copies this assay can detect is 138 copies/mL. A negative result does not preclude SARS-Cov-2 infection and should not be used as the sole basis for treatment or other patient management decisions. A negative result may occur with  improper specimen collection/handling, submission of specimen other than nasopharyngeal swab, presence of viral mutation(s) within the areas targeted by this assay, and inadequate number of viral copies(<138 copies/mL). A negative result must be combined with clinical observations, patient history, and epidemiological information. The expected result is Negative.  Fact Sheet for Patients:  EntrepreneurPulse.com.au  Fact Sheet for Healthcare Providers:  IncredibleEmployment.be  This test is no t yet approved or cleared by the [place] FDA and  has been authorized for detection and/or diagnosis of SARS-CoV-2 by FDA under an Emergency Use  Authorization (EUA). This EUA will remain  in effect (meaning this test can be used) for the duration of the COVID-19 declaration under Section 564(b)(1) of the Act, 21 U.S.C.section 360bbb-3(b)(1), unless the authorization is terminated  or revoked sooner.       Influenza A by PCR NEGATIVE NEGATIVE Final   Influenza B by PCR NEGATIVE NEGATIVE Final    Comment: (NOTE) The Xpert Xpress SARS-CoV-2/FLU/RSV plus assay is intended as an aid in the diagnosis of influenza from Nasopharyngeal swab specimens and should not be used as a sole basis for treatment. Nasal washings and aspirates are unacceptable for Xpert Xpress SARS-CoV-2/FLU/RSV testing.  Fact Sheet for Patients: EntrepreneurPulse.com.au  Fact Sheet for Healthcare Providers: IncredibleEmployment.be  This test is not yet approved or cleared by the [place] FDA and has been authorized for detection and/or diagnosis of SARS-CoV-2 by FDA under an Emergency Use Authorization (EUA). This EUA will remain in effect (meaning this test can be used) for the duration of the COVID-19 declaration under Section 564(b)(1) of the Act, 21 U.S.C. section 360bbb-3(b)(1), unless the authorization is terminated or revoked.  Performed at [facility], [place]  [address] [place], [place] [address]   Urine Culture     Status: Abnormal   Collection Time: 03/09/21 12:00 AM   Specimen: Urine, Clean Catch  Result Value Ref Range Status   Specimen Description   Final    URINE, CLEAN CATCH Performed at [facility], [place] [address], [place], [place] [address]    Special Requests   Final    NONE Performed at [facility], [place] [address], [place],  [address]    Culture >=100,000 COLONIES/mL ESCHERICHIA COLI (A)  Final   Report Status 03/11/2021 FINAL  Final   Organism ID, Bacteria ESCHERICHIA COLI (A)  Final      Susceptibility   Escherichia coli  - MIC*    AMPICILLIN <=2 SENSITIVE Sensitive     CEFAZOLIN <=4 SENSITIVE Sensitive     CEFEPIME <=0.12 SENSITIVE Sensitive     CEFTRIAXONE <=0.25 SENSITIVE Sensitive     CIPROFLOXACIN <=0.25 SENSITIVE Sensitive     GENTAMICIN <=1 SENSITIVE Sensitive     IMIPENEM <=0.25 SENSITIVE Sensitive     NITROFURANTOIN <=16 SENSITIVE Sensitive     TRIMETH/SULFA <=20 SENSITIVE Sensitive     AMPICILLIN/SULBACTAM <=2 SENSITIVE Sensitive     PIP/TAZO <=4 SENSITIVE Sensitive     * >=100,000 COLONIES/mL ESCHERICHIA COLI     Studies: DG Chest 2 View  Result Date: 03/08/2021 CLINICAL DATA:  Cough, sickle cell. EXAM: CHEST - 2 VIEW COMPARISON:  Chest x-ray 02/21/2018. FINDINGS: Heart is mildly enlarged, unchanged. The lungs are clear. There is no pleural effusion or pneumothorax identified. The osseous structures are within normal limits. IMPRESSION: 1. No acute cardiopulmonary process. 2. Stable cardiomegaly. Electronically Signed   By: Ronney Asters M.D.   On: 03/08/2021 19:36    Scheduled Meds:  sodium chloride   Intravenous Once   enoxaparin (LOVENOX) injection  40 mg Subcutaneous Q24H   feeding supplement  1 Container Oral BID BM   feeding supplement  237 mL Oral BID BM   hydroxyurea  1,200 mg Oral Daily   ketorolac  15 mg Intravenous Q6H   morphine   Intravenous Q4H   senna-docusate  1 tablet Oral BID   voxelotor  3 tablet Oral Daily   Continuous Infusions:  cefTRIAXone (ROCEPHIN)  IV 1 g (03/10/21 1220)   diphenhydrAMINE

## 2021-03-11 NOTE — Plan of Care (Signed)
  Problem: Education: Goal: Knowledge of General Education information will improve Description Including pain rating scale, medication(s)/side effects and non-pharmacologic comfort measures Outcome: Progressing   Problem: Health Behavior/Discharge Planning: Goal: Ability to manage health-related needs will improve Outcome: Progressing   

## 2021-03-11 NOTE — Progress Notes (Signed)
PCA discontinued, wasted 47ml of Morphine remaining in PCA tube with Loleta Chance, RN.

## 2021-03-12 LAB — COMPREHENSIVE METABOLIC PANEL
ALT: 16 U/L (ref 0–44)
AST: 20 U/L (ref 15–41)
Albumin: 3.9 g/dL (ref 3.5–5.0)
Alkaline Phosphatase: 88 U/L (ref 38–126)
Anion gap: 9 (ref 5–15)
BUN: 6 mg/dL (ref 6–20)
CO2: 21 mmol/L — ABNORMAL LOW (ref 22–32)
Calcium: 8.8 mg/dL — ABNORMAL LOW (ref 8.9–10.3)
Chloride: 107 mmol/L (ref 98–111)
Creatinine, Ser: 0.34 mg/dL — ABNORMAL LOW (ref 0.44–1.00)
GFR, Estimated: >60 mL/min (ref >60)
Glucose, Bld: 91 mg/dL (ref 70–99)
Potassium: 3.2 mmol/L — ABNORMAL LOW (ref 3.5–5.1)
Sodium: 137 mmol/L (ref 135–145)
Total Bilirubin: 1.1 mg/dL (ref 0.3–1.2)
Total Protein: 7.7 g/dL (ref 6.5–8.1)

## 2021-03-12 LAB — CBC WITH DIFFERENTIAL/PLATELET
Abs Immature Granulocytes: 0.02 10*3/uL (ref 0.00–0.07)
Basophils Absolute: 0 10*3/uL (ref 0.0–0.1)
Basophils Relative: 0 %
Eosinophils Absolute: 0.2 10*3/uL (ref 0.0–0.5)
Eosinophils Relative: 3 %
HCT: 24.8 % — ABNORMAL LOW (ref 36.0–46.0)
Hemoglobin: 8.6 g/dL — ABNORMAL LOW (ref 12.0–15.0)
Immature Granulocytes: 0 %
Lymphocytes Relative: 27 %
Lymphs Abs: 2.1 10*3/uL (ref 0.7–4.0)
MCH: 29.8 pg (ref 26.0–34.0)
MCHC: 34.7 g/dL (ref 30.0–36.0)
MCV: 85.8 fL (ref 80.0–100.0)
Monocytes Absolute: 0.6 10*3/uL (ref 0.1–1.0)
Monocytes Relative: 8 %
Neutro Abs: 4.9 10*3/uL (ref 1.7–7.7)
Neutrophils Relative %: 62 %
Platelets: 494 10*3/uL — ABNORMAL HIGH (ref 150–400)
RBC: 2.89 MIL/uL — ABNORMAL LOW (ref 3.87–5.11)
RDW: 22.4 % — ABNORMAL HIGH (ref 11.5–15.5)
WBC: 7.9 10*3/uL (ref 4.0–10.5)
nRBC: 1.6 % — ABNORMAL HIGH (ref 0.0–0.2)

## 2021-03-12 NOTE — Progress Notes (Signed)
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 coli UTI (urinary tract infection)   Leukocytosis   Anemia of chronic disease  Sickle cell pain crisis: Patient is on oral medications as well as PRN Morphine. Pain is down to 4/5.  Continue to treat UTI.May DC in am E. coli UTI: Continue empiric antibiotics.  May transition to oral agents today and hopefully discharge tomorrow if stable. Anemia of chronic disease: H&H appears stable.  Continue to monitor Leukocytosis: Secondary to UTI and sickle cell crisis.  Continue to monitor  Code Status: Full code Family Communication: Mother Disposition Plan: [place]  Pager [phone]. If 7PM-7AM, please contact night-coverage.  03/12/2021, 9:28 AM  LOS: 3 days   Brief narrative: Alicia Villegas is a 21 year old female with a medical history significant for sickle cell disease that was admitted for sickle cell crisis.   Patient continues to complain of low back pain.  She rates her pain as 6/10.  Urine culture yielded E. coli greater than 100,000 colonies.  Patient denies any urinary frequency, urgency, or hesitancy.  Patient febrile, maximum temperature 101 F. She denies any headache, dizziness, chest pain, shortness of breath, nausea, vomiting, or diarrhea.  Consultants: None  Procedures: None  Antibiotics: Rocephin  HPI/Subjective: Patient is much better. Has been on Abx. Patient not accepting some forms of treatments.  Objective: Vitals:   03/11/21 1011 03/11/21 1246 03/11/21 2051 03/12/21 0434  BP: 105/65 99/60 110/75 103/61  Pulse: 86 96 93 82  Resp:  18 17 17   Temp: 97.7 F (36.5 C) 98.2 F (36.8 C) 98.5 F (36.9 C) 98.8 F (37.1 C)  TempSrc: Oral Oral Oral Oral  SpO2:  98% 100% 99%  Weight:      Height:       Weight change:   Intake/Output  Summary (Last 24 hours) at 03/12/2021 9381 Last data filed at 03/11/2021 2048 Gross per 24 hour  Intake 360 ml  Output --  Net 360 ml     General: Alert, awake, oriented x3, in no acute distress.  HEENT: [place]/AT PEERL, EOMI Neck: Trachea midline,  no masses, no thyromegal,y no JVD, no carotid bruit OROPHARYNX:  Moist, No exudate/ erythema/lesions.  Heart: Regular rate and rhythm, without murmurs, rubs, gallops, PMI non-displaced, no heaves or thrills on palpation.  Lungs: Clear to auscultation, no wheezing or rhonchi noted. No increased vocal fremitus resonant to percussion  Abdomen: Soft, nontender, nondistended, positive bowel sounds, no masses no hepatosplenomegaly noted..  Neuro: No focal neurological deficits noted cranial nerves II through XII grossly intact. DTRs 2+ bilaterally upper and lower extremities. Strength 5 out of 5 in bilateral upper and lower extremities. Musculoskeletal: No warm swelling or erythema around joints, no spinal tenderness noted. Psychiatric: Patient alert and oriented x3, good insight and cognition, good recent to remote recall. Lymph node survey: No cervical axillary or inguinal lymphadenopathy noted.   Data Reviewed: Basic Metabolic Panel: Recent Labs  Lab 03/08/21 2205  NA 138  K 3.8  CL 112*  CO2 19*  GLUCOSE 112*  BUN <5*  CREATININE 0.41*  CALCIUM 8.8*    Liver Function Tests: Recent Labs  Lab 03/08/21 2205  AST 43*  ALT 13  ALKPHOS 82  BILITOT 2.1*  PROT 7.6  ALBUMIN 4.1    No results for input(s): LIPASE, AMYLASE in the last 168  hours. No results for input(s): AMMONIA in the last 168 hours. CBC: Recent Labs  Lab 03/08/21 2205 03/09/21 2205 03/11/21 0612  WBC 18.7*  --  12.7*  NEUTROABS 15.5*  --   --   HGB 6.0* 7.8* 8.1*  HCT 16.8* 21.9* 24.4*  MCV 86.2  --  88.7  PLT 419*  --  439*    Cardiac Enzymes: No results for input(s): CKTOTAL, CKMB, CKMBINDEX, TROPONINI in the last 168 hours. BNP (last 3 results) No  results for input(s): BNP in the last 8760 hours.  ProBNP (last 3 results) No results for input(s): PROBNP in the last 8760 hours.  CBG: No results for input(s): GLUCAP in the last 168 hours.  Recent Results (from the past 240 hour(s))  Resp Panel by RT-PCR (Flu A&B, Covid) Nasopharyngeal Swab     Status: None   Collection Time: 03/08/21  9:37 PM   Specimen: Nasopharyngeal Swab; Nasopharyngeal(NP) swabs in vial transport medium  Result Value Ref Range Status   SARS Coronavirus 2 by RT PCR NEGATIVE NEGATIVE Final    Comment: (NOTE) SARS-CoV-2 target nucleic acids are NOT DETECTED.  The SARS-CoV-2 RNA is generally detectable in upper respiratory specimens during the acute phase of infection. The lowest concentration of SARS-CoV-2 viral copies this assay can detect is 138 copies/mL. A negative result does not preclude SARS-Cov-2 infection and should not be used as the sole basis for treatment or other patient management decisions. A negative result may occur with  improper specimen collection/handling, submission of specimen other than nasopharyngeal swab, presence of viral mutation(s) within the areas targeted by this assay, and inadequate number of viral copies(<138 copies/mL). A negative result must be combined with clinical observations, patient history, and epidemiological information. The expected result is Negative.  Fact Sheet for Patients:  EntrepreneurPulse.com.au  Fact Sheet for Healthcare Providers:  IncredibleEmployment.be  This test is no t yet approved or cleared by the [place] FDA and  has been authorized for detection and/or diagnosis of SARS-CoV-2 by FDA under an Emergency Use Authorization (EUA). This EUA will remain  in effect (meaning this test can be used) for the duration of the COVID-19 declaration under Section 564(b)(1) of the Act, 21 U.S.C.section 360bbb-3(b)(1), unless the authorization is terminated  or  revoked sooner.       Influenza A by PCR NEGATIVE NEGATIVE Final   Influenza B by PCR NEGATIVE NEGATIVE Final    Comment: (NOTE) The Xpert Xpress SARS-CoV-2/FLU/RSV plus assay is intended as an aid in the diagnosis of influenza from Nasopharyngeal swab specimens and should not be used as a sole basis for treatment. Nasal washings and aspirates are unacceptable for Xpert Xpress SARS-CoV-2/FLU/RSV testing.  Fact Sheet for Patients: EntrepreneurPulse.com.au  Fact Sheet for Healthcare Providers: IncredibleEmployment.be  This test is not yet approved or cleared by the [place] FDA and has been authorized for detection and/or diagnosis of SARS-CoV-2 by FDA under an Emergency Use Authorization (EUA). This EUA will remain in effect (meaning this test can be used) for the duration of the COVID-19 declaration under Section 564(b)(1) of the Act, 21 U.S.C. section 360bbb-3(b)(1), unless the authorization is terminated or revoked.  Performed at [facility], [place] [address] [place], [place] [address]   Urine Culture     Status: Abnormal   Collection Time: 03/09/21 12:00 AM   Specimen: Urine, Clean Catch  Result Value Ref Range Status   Specimen Description   Final    URINE, CLEAN CATCH Performed at [facility]  [place] [address], [place], [place] [address]    Special Requests   Final    NONE Performed at [facility], [place] [address] [place], [place] [address]    Culture >=100,000 COLONIES/mL ESCHERICHIA COLI (A)  Final   Report Status 03/11/2021 FINAL  Final   Organism ID, Bacteria ESCHERICHIA COLI (A)  Final      Susceptibility   Escherichia coli - MIC*    AMPICILLIN <=2 SENSITIVE Sensitive     CEFAZOLIN <=4 SENSITIVE Sensitive     CEFEPIME <=0.12 SENSITIVE Sensitive     CEFTRIAXONE <=0.25 SENSITIVE Sensitive     CIPROFLOXACIN <=0.25 SENSITIVE Sensitive     GENTAMICIN <=1 SENSITIVE  Sensitive     IMIPENEM <=0.25 SENSITIVE Sensitive     NITROFURANTOIN <=16 SENSITIVE Sensitive     TRIMETH/SULFA <=20 SENSITIVE Sensitive     AMPICILLIN/SULBACTAM <=2 SENSITIVE Sensitive     PIP/TAZO <=4 SENSITIVE Sensitive     * >=100,000 COLONIES/mL ESCHERICHIA COLI      Studies: DG Chest 2 View  Result Date: 03/08/2021 CLINICAL DATA:  Cough, sickle cell. EXAM: CHEST - 2 VIEW COMPARISON:  Chest x-ray 02/21/2018. FINDINGS: Heart is mildly enlarged, unchanged. The lungs are clear. There is no pleural effusion or pneumothorax identified. The osseous structures are within normal limits. IMPRESSION: 1. No acute cardiopulmonary process. 2. Stable cardiomegaly. Electronically Signed   By: Ronney Asters M.D.   On: 03/08/2021 19:36    Scheduled Meds:  sodium chloride   Intravenous Once   cefadroxil  1,000 mg Oral BID   enoxaparin (LOVENOX) injection  40 mg Subcutaneous Q24H   feeding supplement  1 Container Oral BID BM   feeding supplement  237 mL Oral BID BM   hydroxyurea  1,200 mg Oral Daily   ketorolac  15 mg Intravenous Q6H   senna-docusate  1 tablet Oral BID   voxelotor  3 tablet Oral Daily   Continuous Infusions:

## 2021-03-13 ENCOUNTER — Encounter: Payer: Self-pay | Admitting: Family Medicine

## 2021-03-13 DIAGNOSIS — D5700 Hb-SS disease with crisis, unspecified: Secondary | ICD-10-CM | POA: Diagnosis not present

## 2021-03-13 NOTE — TOC Transition Note (Signed)
Transition of Care [facility]) - CM/SW Discharge Note   Patient Details  Name: Alicia Villegas MRN: [id number] Date of Birth: [birth year]  Transition of Care [facility]) CM/SW Contact:  Dessa Phi, RN Phone Number: 03/13/2021, 12:28 PM   Clinical Narrative: d/c home no CM needs.      Final next level of care: Home/Self Care Barriers to Discharge: No Barriers Identified   Patient Goals and CMS Choice        Discharge Placement                       Discharge Plan and Services                                     Social Determinants of Health (SDOH) Interventions     Readmission Risk Interventions No flowsheet data found.

## 2021-03-13 NOTE — Significant Event (Signed)
CBC ordered for AM labs.  Pt refused Will let provider know

## 2021-03-13 NOTE — Discharge Summary (Signed)
Physician Discharge Summary  Alicia Villegas NWG:[id number] DOB: 07/15/2000 DOA: 03/08/2021  PCP: Dorena Dew, FNP  Admit date: 03/08/2021  Discharge date: 03/13/2021  Discharge Diagnoses:  Principal Problem:   Sickle cell pain crisis ([place]) Active Problems:   E. coli UTI (urinary tract infection)   Leukocytosis   Anemia of chronic disease   Discharge Condition: Stable  Disposition:   Follow-up Information     Dorena Dew, FNP Follow up in 1 week(s).   Specialty: Family Medicine Why: Day hospital-[id number] Contact information: [address] [address] [phone]                Pt is discharged home in good condition and is to follow up with Dorena Dew, FNP this week to have labs evaluated. Alicia Villegas is instructed to increase activity slowly and balance with rest for the next few days, and use prescribed medication to complete treatment of pain  Diet: Regular Wt Readings from Last 3 Encounters:  03/10/21 48.9 kg (13 %, Z= -1.15)*  11/08/20 49.3 kg (15 %, Z= -1.05)*  05/09/20 49.3 kg (16 %, Z= -0.99)*   * Growth percentiles are based on CDC (Girls, 2-20 Years) data.    History of present illness:  Alicia Villegas is a 21 year old female with a medical history significant for sickle cell disease type SS, history of acute chest syndrome, and history of anemia of chronic disease presented to the emergency department with complaints of chest and back pain.  Symptoms were similar to prior sickle cell pain crisis.  Symptoms are severe, but not worse or different than prior sickle cell crisis.  Patient denies shortness of breath.  Oxygen saturation 90% on room air in the emergency department. Emergency Department course While in the emergency department patient had tachycardic heart rate 130s.  Hemoglobin 6.0 g/dL, significantly lower than her baseline.  2 units packed red blood cells were ordered at that time.  Patient was previously  transfused at [facility] in September 2022. Patient thereby admitted to [facility] for further management of sickle cell pain crisis.  Hospital Course:  Sickle cell disease with pain crisis Patient was admitted for sickle cell pain crisis and managed appropriately with IVF, IV Dilaudid via PCA and IV Toradol, as well as other adjunct therapies per sickle cell pain management protocols.  IV Dilaudid PCA weaned appropriately.  Patient will discharge home and resume ibuprofen and Tylenol.  Pain intensity is decreased throughout admission and patient feels that she can manage at home on current medication regimen.  Symptomatic anemia: On admission, hemoglobin 6.0 g/dL.  Patient was transfused 2 units PRBCs without complication.  Patient will follow-up with PCP in 2 weeks, at that time we will repeat CBC with differential and CMP.  Also, patient will notify sickle cell day infusion center if pain persists.  Urinary tract infection with E. coli: Urine culture yielded E. coli greater than 100,000 colonies.  Patient was treated with ceftriaxone 1 g IV for total of 3 days.  No further antibiotics warranted at this time.  Patient is alert, oriented, and ambulating without assistance.  She was discharged home in a hemodynamically stable condition.   Patient was therefore discharged home today in a hemodynamically stable condition.   Alicia Villegas will follow-up with PCP within 1 week of this discharge. Alicia Villegas was counseled extensively about nonpharmacologic means of pain management, patient verbalized understanding and was appreciative of  the care received during this admission.   We discussed  the need for good hydration, monitoring of hydration status, avoidance of heat, cold, stress, and infection triggers. We discussed the need to be adherent with taking Hydrea and other home medications. Patient was reminded of the need to seek medical attention immediately if any symptom of bleeding, anemia, or infection  occurs.  Discharge Exam: Vitals:   03/12/21 1222 03/12/21 2100  BP: 111/64 106/66  Pulse: 89 86  Resp: 18 20  Temp: 98.8 F (37.1 C) 99.3 F (37.4 C)  SpO2: 100% 98%   Vitals:   03/11/21 2051 03/12/21 0434 03/12/21 1222 03/12/21 2100  BP: 110/75 103/61 111/64 106/66  Pulse: 93 82 89 86  Resp: 17 17 18 20   Temp: 98.5 F (36.9 C) 98.8 F (37.1 C) 98.8 F (37.1 C) 99.3 F (37.4 C)  TempSrc: Oral Oral Oral Oral  SpO2: 100% 99% 100% 98%  Weight:      Height:        General appearance : Awake, alert, not in any distress. Speech Clear. Not toxic looking HEENT: Atraumatic and Normocephalic, pupils equally reactive to light and accomodation Neck: Supple, no JVD. No cervical lymphadenopathy.  Chest: Good air entry bilaterally, no added sounds  CVS: S1 S2 regular, no murmurs.  Abdomen: Bowel sounds present, Non tender and not distended with no gaurding, rigidity or rebound. Extremities: B/L Lower Ext shows no edema, both legs are warm to touch Neurology: Awake alert, and oriented X 3, CN II-XII intact, Non focal Skin: No Rash  Discharge Instructions  Discharge Instructions     Discharge patient   Complete by: As directed    Discharge disposition: 01-Home or Self Care   Discharge patient date: 03/13/2021      Allergies as of 03/13/2021   No Known Allergies      Medication List     TAKE these medications    acetaminophen 325 MG tablet Commonly known as: TYLENOL Take 650 mg by mouth every 6 (six) hours as needed for moderate pain or mild pain.   hydroxyurea 100 mg/mL Susp Commonly known as: HYDREA Take 1,200 mg by mouth daily.   ibuprofen 200 MG tablet Commonly known as: ADVIL Take 200 mg by mouth every 6 (six) hours as needed for moderate pain or mild pain.   voxelotor 500 MG Tabs tablet Commonly known as: OXBRYTA Take 1,500 mg by mouth daily.        The results of significant diagnostics from this hospitalization (including imaging, microbiology,  ancillary and laboratory) are listed below for reference.    Significant Diagnostic Studies: DG Chest 2 View  Result Date: 03/08/2021 CLINICAL DATA:  Cough, sickle cell. EXAM: CHEST - 2 VIEW COMPARISON:  Chest x-ray 02/21/2018. FINDINGS: Heart is mildly enlarged, unchanged. The lungs are clear. There is no pleural effusion or pneumothorax identified. The osseous structures are within normal limits. IMPRESSION: 1. No acute cardiopulmonary process. 2. Stable cardiomegaly. Electronically Signed   By: Ronney Asters M.D.   On: 03/08/2021 19:36    Microbiology: Recent Results (from the past 240 hour(s))  Resp Panel by RT-PCR (Flu A&B, Covid) Nasopharyngeal Swab     Status: None   Collection Time: 03/08/21  9:37 PM   Specimen: Nasopharyngeal Swab; Nasopharyngeal(NP) swabs in vial transport medium  Result Value Ref Range Status   SARS Coronavirus 2 by RT PCR NEGATIVE NEGATIVE Final    Comment: (NOTE) SARS-CoV-2 target nucleic acids are NOT DETECTED.  The SARS-CoV-2 RNA is generally detectable in upper respiratory specimens during the acute phase  of infection. The lowest concentration of SARS-CoV-2 viral copies this assay can detect is 138 copies/mL. A negative result does not preclude SARS-Cov-2 infection and should not be used as the sole basis for treatment or other patient management decisions. A negative result may occur with  improper specimen collection/handling, submission of specimen other than nasopharyngeal swab, presence of viral mutation(s) within the areas targeted by this assay, and inadequate number of viral copies(<138 copies/mL). A negative result must be combined with clinical observations, patient history, and epidemiological information. The expected result is Negative.  Fact Sheet for Patients:  EntrepreneurPulse.com.au  Fact Sheet for Healthcare Providers:  IncredibleEmployment.be  This test is no t yet approved or cleared by the  [place] FDA and  has been authorized for detection and/or diagnosis of SARS-CoV-2 by FDA under an Emergency Use Authorization (EUA). This EUA will remain  in effect (meaning this test can be used) for the duration of the COVID-19 declaration under Section 564(b)(1) of the Act, 21 U.S.C.section 360bbb-3(b)(1), unless the authorization is terminated  or revoked sooner.       Influenza A by PCR NEGATIVE NEGATIVE Final   Influenza B by PCR NEGATIVE NEGATIVE Final    Comment: (NOTE) The Xpert Xpress SARS-CoV-2/FLU/RSV plus assay is intended as an aid in the diagnosis of influenza from Nasopharyngeal swab specimens and should not be used as a sole basis for treatment. Nasal washings and aspirates are unacceptable for Xpert Xpress SARS-CoV-2/FLU/RSV testing.  Fact Sheet for Patients: EntrepreneurPulse.com.au  Fact Sheet for Healthcare Providers: IncredibleEmployment.be  This test is not yet approved or cleared by the [place] FDA and has been authorized for detection and/or diagnosis of SARS-CoV-2 by FDA under an Emergency Use Authorization (EUA). This EUA will remain in effect (meaning this test can be used) for the duration of the COVID-19 declaration under Section 564(b)(1) of the Act, 21 U.S.C. section 360bbb-3(b)(1), unless the authorization is terminated or revoked.  Performed at [facility], [place] [address] [place], [place] [address]   Urine Culture     Status: Abnormal   Collection Time: 03/09/21 12:00 AM   Specimen: Urine, Clean Catch  Result Value Ref Range Status   Specimen Description   Final    URINE, CLEAN CATCH Performed at [facility], [place] [address], [place], [place] [address]    Special Requests   Final    NONE Performed at [facility], [place] [address], [place],  [address]    Culture >=100,000 COLONIES/mL ESCHERICHIA COLI (A)  Final   Report  Status 03/11/2021 FINAL  Final   Organism ID, Bacteria ESCHERICHIA COLI (A)  Final      Susceptibility   Escherichia coli - MIC*    AMPICILLIN <=2 SENSITIVE Sensitive     CEFAZOLIN <=4 SENSITIVE Sensitive     CEFEPIME <=0.12 SENSITIVE Sensitive     CEFTRIAXONE <=0.25 SENSITIVE Sensitive     CIPROFLOXACIN <=0.25 SENSITIVE Sensitive     GENTAMICIN <=1 SENSITIVE Sensitive     IMIPENEM <=0.25 SENSITIVE Sensitive     NITROFURANTOIN <=16 SENSITIVE Sensitive     TRIMETH/SULFA <=20 SENSITIVE Sensitive     AMPICILLIN/SULBACTAM <=2 SENSITIVE Sensitive     PIP/TAZO <=4 SENSITIVE Sensitive     * >=100,000 COLONIES/mL ESCHERICHIA COLI     Labs: Basic Metabolic Panel: Recent Labs  Lab 03/08/21 2205 03/12/21 0925  NA 138 137  K 3.8 3.2*  CL 112* 107  CO2 19* 21*  GLUCOSE 112* 91  BUN <5* 6  CREATININE 0.41* 0.34*  CALCIUM 8.8* 8.8*   Liver Function Tests: Recent Labs  Lab 03/08/21 2205 03/12/21 0925  AST 43* 20  ALT 13 16  ALKPHOS 82 88  BILITOT 2.1* 1.1  PROT 7.6 7.7  ALBUMIN 4.1 3.9   No results for input(s): LIPASE, AMYLASE in the last 168 hours. No results for input(s): AMMONIA in the last 168 hours. CBC: Recent Labs  Lab 03/08/21 2205 03/09/21 2205 03/11/21 0612 03/12/21 0925  WBC 18.7*  --  12.7* 7.9  NEUTROABS 15.5*  --   --  4.9  HGB 6.0* 7.8* 8.1* 8.6*  HCT 16.8* 21.9* 24.4* 24.8*  MCV 86.2  --  88.7 85.8  PLT 419*  --  439* 494*   Cardiac Enzymes: No results for input(s): CKTOTAL, CKMB, CKMBINDEX, TROPONINI in the last 168 hours. BNP: Invalid input(s): POCBNP CBG: No results for input(s): GLUCAP in the last 168 hours.  Time coordinating discharge: 30 minutes  Signed:  Donia Pounds  APRN, MSN, FNP-C [facility] [address] [place], [place] [address] [phone]  [facility] 03/13/2021, 12:23 PM

## 2021-03-14 ENCOUNTER — Telehealth: Payer: Self-pay

## 2021-03-14 NOTE — Telephone Encounter (Signed)
Transition Care Management Unsuccessful Follow-up Telephone Call  Date of discharge and from where:  03/13/2021 from [facility]  Attempts:  1st Attempt  Reason for unsuccessful TCM follow-up call:  Left voice message

## 2021-03-15 NOTE — Telephone Encounter (Signed)
Transition Care Management Unsuccessful Follow-up Telephone Call  Date of discharge and from where:  03/13/2021 from [facility]  Attempts:  2nd Attempt  Reason for unsuccessful TCM follow-up call:  Left voice message

## 2021-03-16 NOTE — Telephone Encounter (Signed)
Transition Care Management Unsuccessful Follow-up Telephone Call  Date of discharge and from where:  03/13/2021 from [facility]  Attempts:  3rd Attempt  Reason for unsuccessful TCM follow-up call:  Unable to reach patient

## 2021-03-23 ENCOUNTER — Inpatient Hospital Stay (HOSPITAL_COMMUNITY)
Admission: EM | Admit: 2021-03-23 | Discharge: 2021-03-28 | DRG: 812 | Disposition: A | Discharge status: Home / Self Care | Payer: Medicaid Other | Attending: Internal Medicine | Admitting: Internal Medicine

## 2021-03-23 ENCOUNTER — Other Ambulatory Visit: Payer: Self-pay | Admitting: Family Medicine

## 2021-03-23 DIAGNOSIS — D5700 Hb-SS disease with crisis, unspecified: Principal | ICD-10-CM | POA: Diagnosis present

## 2021-03-23 DIAGNOSIS — Z8613 Personal history of malaria: Secondary | ICD-10-CM

## 2021-03-23 DIAGNOSIS — E876 Hypokalemia: Secondary | ICD-10-CM | POA: Diagnosis present

## 2021-03-23 DIAGNOSIS — Z79899 Other long term (current) drug therapy: Secondary | ICD-10-CM

## 2021-03-23 DIAGNOSIS — D75838 Other thrombocytosis: Secondary | ICD-10-CM | POA: Diagnosis present

## 2021-03-23 DIAGNOSIS — D72829 Elevated white blood cell count, unspecified: Secondary | ICD-10-CM | POA: Diagnosis present

## 2021-03-23 DIAGNOSIS — D75839 Thrombocytosis, unspecified: Secondary | ICD-10-CM

## 2021-03-23 DIAGNOSIS — Z9081 Acquired absence of spleen: Secondary | ICD-10-CM

## 2021-03-23 MED ORDER — OXYCODONE-ACETAMINOPHEN 5-325 MG PO TABS: 1.0000 | ORAL_TABLET | ORAL | 0 refills | Status: DC | PRN

## 2021-03-23 NOTE — Progress Notes (Signed)
Alicia Villegas is a 21 year old female with a medical history significant for sickle cell disease notify sickle cell day infusion center with complaints of upper and lower extremity pain over the past several days.  Patient has been taking Tylenol and ibuprofen without any relief.  Patient is unable to report to the emergency department at this time and is requesting advice.  Patient advised to hydrate with 64 ounces of fluid, ibuprofen 800 mg every 8 hours as needed with food.  Also, Percocet 5-3 25 every 4 hours as needed for severe pain was sent to patient's pharmacy.  PDMP reviewed prior to prescribing opiate medications, no inconsistencies noted. The patient was given clear instructions to go to ER or return to medical center if symptoms do not improve, worsen or new problems develop. The patient verbalized understanding.    Donia Pounds  APRN, MSN, FNP-C [name] [address] [place], [place] [address] [phone]

## 2021-03-24 ENCOUNTER — Encounter (HOSPITAL_COMMUNITY): Payer: Self-pay | Admitting: Emergency Medicine

## 2021-03-24 ENCOUNTER — Other Ambulatory Visit: Payer: Self-pay

## 2021-03-24 DIAGNOSIS — D75839 Thrombocytosis, unspecified: Secondary | ICD-10-CM | POA: Diagnosis not present

## 2021-03-24 DIAGNOSIS — D5700 Hb-SS disease with crisis, unspecified: Secondary | ICD-10-CM | POA: Diagnosis not present

## 2021-03-24 DIAGNOSIS — D72829 Elevated white blood cell count, unspecified: Secondary | ICD-10-CM | POA: Diagnosis not present

## 2021-03-24 DIAGNOSIS — D75838 Other thrombocytosis: Secondary | ICD-10-CM | POA: Diagnosis not present

## 2021-03-24 DIAGNOSIS — Z8613 Personal history of malaria: Secondary | ICD-10-CM | POA: Diagnosis not present

## 2021-03-24 DIAGNOSIS — Z9081 Acquired absence of spleen: Secondary | ICD-10-CM | POA: Diagnosis not present

## 2021-03-24 DIAGNOSIS — E876 Hypokalemia: Secondary | ICD-10-CM | POA: Diagnosis not present

## 2021-03-24 DIAGNOSIS — Z79899 Other long term (current) drug therapy: Secondary | ICD-10-CM | POA: Diagnosis not present

## 2021-03-24 LAB — COMPREHENSIVE METABOLIC PANEL
ALT: 16 U/L (ref 0–44)
AST: 44 U/L — ABNORMAL HIGH (ref 15–41)
Albumin: 4.5 g/dL (ref 3.5–5.0)
Alkaline Phosphatase: 85 U/L (ref 38–126)
Anion gap: 8 (ref 5–15)
BUN: 5 mg/dL — ABNORMAL LOW (ref 6–20)
CO2: 21 mmol/L — ABNORMAL LOW (ref 22–32)
Calcium: 9.2 mg/dL (ref 8.9–10.3)
Chloride: 105 mmol/L (ref 98–111)
Creatinine, Ser: 0.34 mg/dL — ABNORMAL LOW (ref 0.44–1.00)
GFR, Estimated: >60 mL/min (ref >60)
Glucose, Bld: 106 mg/dL — ABNORMAL HIGH (ref 70–99)
Potassium: 3.3 mmol/L — ABNORMAL LOW (ref 3.5–5.1)
Sodium: 134 mmol/L — ABNORMAL LOW (ref 135–145)
Total Bilirubin: 2 mg/dL — ABNORMAL HIGH (ref 0.3–1.2)
Total Protein: 8.4 g/dL — ABNORMAL HIGH (ref 6.5–8.1)

## 2021-03-24 LAB — RETICULOCYTES
Immature Retic Fract: 39.4 % — ABNORMAL HIGH (ref 2.3–15.9)
RBC.: 2.25 MIL/uL — ABNORMAL LOW (ref 3.87–5.11)
Retic Count, Absolute: 211.1 10*3/uL — ABNORMAL HIGH (ref 19.0–186.0)
Retic Ct Pct: 9.4 % — ABNORMAL HIGH (ref 0.4–3.1)

## 2021-03-24 LAB — CBC WITH DIFFERENTIAL/PLATELET
Abs Immature Granulocytes: 0.21 10*3/uL — ABNORMAL HIGH (ref 0.00–0.07)
Basophils Absolute: 0.1 10*3/uL (ref 0.0–0.1)
Basophils Relative: 0 %
Eosinophils Absolute: 0 10*3/uL (ref 0.0–0.5)
Eosinophils Relative: 0 %
HCT: 19.9 % — ABNORMAL LOW (ref 36.0–46.0)
Hemoglobin: 7 g/dL — ABNORMAL LOW (ref 12.0–15.0)
Immature Granulocytes: 1 %
Lymphocytes Relative: 18 %
Lymphs Abs: 2.9 10*3/uL (ref 0.7–4.0)
MCH: 31.1 pg (ref 26.0–34.0)
MCHC: 35.2 g/dL (ref 30.0–36.0)
MCV: 88.4 fL (ref 80.0–100.0)
Monocytes Absolute: 1.6 10*3/uL — ABNORMAL HIGH (ref 0.1–1.0)
Monocytes Relative: 10 %
Neutro Abs: 11.1 10*3/uL — ABNORMAL HIGH (ref 1.7–7.7)
Neutrophils Relative %: 71 %
Platelets: 598 10*3/uL — ABNORMAL HIGH (ref 150–400)
RBC: 2.25 MIL/uL — ABNORMAL LOW (ref 3.87–5.11)
RDW: 23.5 % — ABNORMAL HIGH (ref 11.5–15.5)
WBC: 15.9 10*3/uL — ABNORMAL HIGH (ref 4.0–10.5)
nRBC: 1.1 % — ABNORMAL HIGH (ref 0.0–0.2)

## 2021-03-24 LAB — LACTATE DEHYDROGENASE: LDH: 627 U/L — ABNORMAL HIGH (ref 98–192)

## 2021-03-24 LAB — I-STAT BETA HCG BLOOD, ED (MC, WL, AP ONLY): I-stat hCG, quantitative: <5 m[IU]/mL (ref <5)

## 2021-03-24 MED ORDER — HYDROMORPHONE HCL 1 MG/ML IJ SOLN
1.0000 mg | Freq: Once | INTRAMUSCULAR | Status: AC
  Administered 2021-03-24: 1 mg via SUBCUTANEOUS
  Filled 2021-03-24: qty 1

## 2021-03-24 MED ORDER — SODIUM CHLORIDE 0.45 % IV SOLN: INTRAVENOUS | Status: DC

## 2021-03-24 MED ORDER — HYDROMORPHONE HCL 1 MG/ML IJ SOLN
0.5000 mg | INTRAMUSCULAR | Status: AC
  Administered 2021-03-24: 0.5 mg via SUBCUTANEOUS
  Filled 2021-03-24: qty 1

## 2021-03-24 MED ORDER — MORPHINE SULFATE 15 MG PO TABS
15.0000 mg | ORAL_TABLET | ORAL | Status: DC | PRN
  Administered 2021-03-24: 15 mg via ORAL
  Filled 2021-03-24: qty 1

## 2021-03-24 MED ORDER — MORPHINE SULFATE 15 MG PO TABS
15.0000 mg | ORAL_TABLET | Freq: Once | ORAL | Status: DC
  Filled 2021-03-24: qty 1

## 2021-03-24 MED ORDER — ONDANSETRON HCL 4 MG/2ML IJ SOLN: 4.0000 mg | Freq: Four times a day (QID) | INTRAMUSCULAR | Status: DC | PRN

## 2021-03-24 MED ORDER — KETOROLAC TROMETHAMINE 15 MG/ML IJ SOLN
15.0000 mg | Freq: Four times a day (QID) | INTRAMUSCULAR | Status: DC
Start: 2021-03-24 — End: 2021-03-28
  Administered 2021-03-24 – 2021-03-28 (×13): 15 mg via INTRAVENOUS
  Filled 2021-03-24 (×14): qty 1

## 2021-03-24 MED ORDER — KETOROLAC TROMETHAMINE 15 MG/ML IJ SOLN
15.0000 mg | Freq: Once | INTRAMUSCULAR | Status: AC
  Administered 2021-03-24: 15 mg via INTRAMUSCULAR
  Filled 2021-03-24: qty 1

## 2021-03-24 MED ORDER — SODIUM CHLORIDE 0.9% FLUSH: 9.0000 mL | INTRAVENOUS | Status: DC | PRN

## 2021-03-24 MED ORDER — HYDROXYUREA 100 MG/ML ORAL SUSPENSION
1200.0000 mg | Freq: Every day | ORAL | Status: DC
  Administered 2021-03-25 – 2021-03-28 (×4): 1200 mg via ORAL

## 2021-03-24 MED ORDER — POTASSIUM CHLORIDE CRYS ER 20 MEQ PO TBCR
40.0000 meq | EXTENDED_RELEASE_TABLET | Freq: Once | ORAL | Status: AC
  Administered 2021-03-24: 40 meq via ORAL
  Filled 2021-03-24 (×2): qty 2

## 2021-03-24 MED ORDER — NALOXONE HCL 0.4 MG/ML IJ SOLN
0.4000 mg | INTRAMUSCULAR | Status: DC | PRN
Start: 2021-03-24 — End: 2021-03-25

## 2021-03-24 MED ORDER — SENNOSIDES-DOCUSATE SODIUM 8.6-50 MG PO TABS
1.0000 | ORAL_TABLET | Freq: Two times a day (BID) | ORAL | Status: DC
  Administered 2021-03-24 – 2021-03-28 (×9): 1 via ORAL
  Filled 2021-03-24 (×9): qty 1

## 2021-03-24 MED ORDER — VOXELOTOR 500 MG PO TABS
1500.0000 mg | ORAL_TABLET | Freq: Every day | ORAL | Status: DC
  Administered 2021-03-26 – 2021-03-28 (×3): 1500 mg via ORAL

## 2021-03-24 MED ORDER — POLYETHYLENE GLYCOL 3350 17 G PO PACK: 17.0000 g | PACK | Freq: Every day | ORAL | Status: DC | PRN

## 2021-03-24 MED ORDER — SODIUM CHLORIDE 0.45 % IV SOLN: INTRAVENOUS | Status: AC

## 2021-03-24 MED ORDER — KETOROLAC TROMETHAMINE 15 MG/ML IJ SOLN: 15.0000 mg | Freq: Once | INTRAMUSCULAR | Status: DC

## 2021-03-24 MED ORDER — ENOXAPARIN SODIUM 30 MG/0.3ML IJ SOSY
30.0000 mg | PREFILLED_SYRINGE | INTRAMUSCULAR | Status: DC
  Filled 2021-03-24 (×3): qty 0.3

## 2021-03-24 MED ORDER — DIPHENHYDRAMINE HCL 12.5 MG/5ML PO ELIX: 12.5000 mg | ORAL_SOLUTION | Freq: Four times a day (QID) | ORAL | Status: DC | PRN

## 2021-03-24 MED ORDER — SODIUM CHLORIDE 0.45 % IV SOLN
INTRAVENOUS | Status: DC
Start: 2021-03-24 — End: 2021-03-24

## 2021-03-24 MED ORDER — HYDROMORPHONE HCL 1 MG/ML IJ SOLN
1.0000 mg | INTRAMUSCULAR | Status: DC | PRN
  Administered 2021-03-24 (×2): 1 mg via INTRAVENOUS
  Filled 2021-03-24 (×2): qty 1

## 2021-03-24 MED ORDER — HYDROMORPHONE HCL 1 MG/ML IJ SOLN
1.0000 mg | Freq: Once | INTRAMUSCULAR | Status: DC
Start: 2021-03-24 — End: 2021-03-24

## 2021-03-24 MED ORDER — ACETAMINOPHEN 325 MG PO TABS
650.0000 mg | ORAL_TABLET | Freq: Once | ORAL | Status: DC
Start: 2021-03-24 — End: 2021-03-24
  Filled 2021-03-24: qty 2

## 2021-03-24 MED ORDER — HYDROMORPHONE 1 MG/ML IV SOLN
INTRAVENOUS | Status: DC
  Administered 2021-03-24: 0.8 mg via INTRAVENOUS
  Administered 2021-03-25: 0.6 mg via INTRAVENOUS
  Administered 2021-03-25: 0 mg via INTRAVENOUS
  Filled 2021-03-24: qty 30

## 2021-03-24 MED ORDER — ACETAMINOPHEN 325 MG PO TABS
650.0000 mg | ORAL_TABLET | Freq: Once | ORAL | Status: AC
  Administered 2021-03-24: 650 mg via ORAL
  Filled 2021-03-24: qty 2

## 2021-03-24 MED ORDER — ONDANSETRON 4 MG PO TBDP
4.0000 mg | ORAL_TABLET | Freq: Once | ORAL | Status: AC
  Administered 2021-03-24: 4 mg via ORAL
  Filled 2021-03-24: qty 1

## 2021-03-24 NOTE — ED Provider Notes (Signed)
This is a 21 year old female with significant history of sickle cell disease presenting with complaints of arm pain that felt similar to prior sickle cell crisis.  Patient was initially seen evaluate by previous provider and patient was giving multiple doses of Dilaudid for pain control.  On reassessment, patient requesting for MS Contin 15 mg as this has helped her in the past.  Will order.  Lab was independent review interpreted by me.  Labs mostly at baseline.  She does have recurrent leukocytosis with a current WBC of 15.9 but no infectious symptoms.  Hemoglobin is 7.0, it was 8.6 approximately 12 days ago.  9:41 AM Due to pain not adequate control and changing lab values, I have discussed care with nurse petitioner, [place], who agrees to admit patient for sickle cell pain management  BP 119/73    Pulse 96    Temp 97.7 F (36.5 C) (Oral)    Resp 18    Ht 5\' 1"  (1.549 m)    Wt 48.5 kg    SpO2 96%    BMI 20.22 kg/m   Results for orders placed or performed during the hospital encounter of 03/23/21  Comprehensive metabolic panel  Result Value Ref Range   Sodium 134 (L) 135 - 145 mmol/L   Potassium 3.3 (L) 3.5 - 5.1 mmol/L   Chloride 105 98 - 111 mmol/L   CO2 21 (L) 22 - 32 mmol/L   Glucose, Bld 106 (H) 70 - 99 mg/dL   BUN 5 (L) 6 - 20 mg/dL   Creatinine, Ser 0.34 (L) 0.44 - 1.00 mg/dL   Calcium 9.2 8.9 - 10.3 mg/dL   Total Protein 8.4 (H) 6.5 - 8.1 g/dL   Albumin 4.5 3.5 - 5.0 g/dL   AST 44 (H) 15 - 41 U/L   ALT 16 0 - 44 U/L   Alkaline Phosphatase 85 38 - 126 U/L   Total Bilirubin 2.0 (H) 0.3 - 1.2 mg/dL   GFR, Estimated >60 >60 mL/min   Anion gap 8 5 - 15  CBC with Differential  Result Value Ref Range   WBC 15.9 (H) 4.0 - 10.5 K/uL   RBC 2.25 (L) 3.87 - 5.11 MIL/uL   Hemoglobin 7.0 (L) 12.0 - 15.0 g/dL   HCT 19.9 (L) 36.0 - 46.0 %   MCV 88.4 80.0 - 100.0 fL   MCH 31.1 26.0 - 34.0 pg   MCHC 35.2 30.0 - 36.0 g/dL   RDW 23.5 (H) 11.5 - 15.5 %   Platelets 598 (H) 150 - 400  K/uL   nRBC 1.1 (H) 0.0 - 0.2 %   Neutrophils Relative % 71 %   Neutro Abs 11.1 (H) 1.7 - 7.7 K/uL   Lymphocytes Relative 18 %   Lymphs Abs 2.9 0.7 - 4.0 K/uL   Monocytes Relative 10 %   Monocytes Absolute 1.6 (H) 0.1 - 1.0 K/uL   Eosinophils Relative 0 %   Eosinophils Absolute 0.0 0.0 - 0.5 K/uL   Basophils Relative 0 %   Basophils Absolute 0.1 0.0 - 0.1 K/uL   Immature Granulocytes 1 %   Abs Immature Granulocytes 0.21 (H) 0.00 - 0.07 K/uL   Polychromasia PRESENT    Sickle Cells PRESENT    Target Cells PRESENT   Reticulocytes  Result Value Ref Range   Retic Ct Pct 9.4 (H) 0.4 - 3.1 %   RBC. 2.25 (L) 3.87 - 5.11 MIL/uL   Retic Count, Absolute 211.1 (H) 19.0 - 186.0 K/uL   Immature  Retic Fract 39.4 (H) 2.3 - 15.9 %  I-Stat beta hCG blood, ED  Result Value Ref Range   I-stat hCG, quantitative <5.0 <5 mIU/mL   Comment 3           DG Chest 2 View  Result Date: 03/08/2021 CLINICAL DATA:  Cough, sickle cell. EXAM: CHEST - 2 VIEW COMPARISON:  Chest x-ray 02/21/2018. FINDINGS: Heart is mildly enlarged, unchanged. The lungs are clear. There is no pleural effusion or pneumothorax identified. The osseous structures are within normal limits. IMPRESSION: 1. No acute cardiopulmonary process. 2. Stable cardiomegaly. Electronically Signed   By: Ronney Asters M.D.   On: 03/08/2021 19:36      Domenic Moras, PA-C 03/24/21 [id number]    Tegeler, Gwenyth Allegra, MD 03/24/21 1116

## 2021-03-24 NOTE — H&P (Signed)
H&P  Patient Demographics:  Alicia Villegas, is a 21 y.o. female  MRN: [id number]   DOB - Dec 27, 2000  Admit Date - 03/23/2021  Outpatient Primary MD for the patient is Dorena Dew, FNP  Chief Complaint  Patient presents with   Sickle Cell Pain Crisis      HPI:   Alicia Villegas  is a 21 y.o. female with a medical history significant for sickle cell disease presented to the emergency department with complaints of bilateral arm pain that has been present over the past 24 hours.  Arm pain is consistent with her previous sickle cell pain crisis.  Patient attributes this crisis to changes in weather.  She notified her sickle cell provider on yesterday and Percocet 5-325 mg every 4 hours was sent to her pharmacy.  Patient states that she was taking medication consistently without any relief.  She currently rates her pain as 10/10 and is very tearful.  She denies any headache, chest pain, urinary symptoms.  No fever, or chills.  No urinary symptoms, nausea, vomiting, or diarrhea.  ER course: Patient's vital signs are recorded as:BP 112/67 (BP Location: Left Arm)    Pulse 87    Temp 97.7 F (36.5 C) (Oral)    Resp 16    Ht 5\' 1"  (1.549 m)    Wt 48.5 kg    SpO2 98%    BMI 20.22 kg/m  CBC shows that patient's hemoglobin is 7.0 g/dL, decreased from previous.  WBCs mildly elevated at 15.9.  Platelets 598,000.  Beta-hCG negative.  Reticulocytes elevated at 211.  Respiratory panel pending.  Patient's pain persists despite IV Dilaudid, IV Toradol, and IV fluids.  Patient will be admitted to [place] for further management of her sickle cell pain crisis.   Review of systems:  In addition to the HPI above, patient reports No fever or chills No Headache, No changes with vision or hearing No problems swallowing food or liquids No chest pain, cough or shortness of breath No abdominal pain, No nausea or vomiting, Bowel movements are regular No blood in stool or urine No dysuria No new skin rashes or  bruises No new joints pains-aches No new weakness, tingling, numbness in any extremity No recent weight gain or loss No polyuria, polydypsia or polyphagia No significant Mental Stressors  A full 10 point Review of Systems was done, except as stated above, all other Review of Systems were negative.  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 Authorizing Provider  acetaminophen (TYLENOL) 325 MG tablet Take 650 mg by  mouth every 6 (six) hours as needed for moderate pain or mild pain.    [provider]  hydroxyurea (HYDREA) 100 mg/mL SUSP Take 1,200 mg by mouth daily.    [provider]  ibuprofen (ADVIL) 200 MG tablet Take 200 mg by mouth every 6 (six) hours as needed for moderate pain or mild pain.    [provider]  oxyCODONE-acetaminophen (PERCOCET) 5-325 MG tablet Take 1 tablet by mouth every 4 (four) hours as needed for severe pain. 03/23/21 03/23/22  Dorena Dew, FNP  voxelotor (OXBRYTA) 500 MG TABS tablet Take 1,500 mg by mouth daily. 10/19/20   [provider]     Allergies:   No Known Allergies   Physical Exam:   Vitals:   Vitals:   03/24/21 0730 03/24/21 1029  BP: 119/73 112/67  Pulse: 96 87  Resp: 18 16   Temp:    SpO2: 96% 98%    Physical Exam: Constitutional: Patient appears well-developed and well-nourished. Not in obvious distress. HENT: Normocephalic, atraumatic, External right and left ear normal. Oropharynx is clear and moist.  Eyes: Conjunctivae and EOM are normal. PERRLA, no scleral icterus. Neck: Normal ROM. Neck supple. No JVD. No tracheal deviation. No thyromegaly. CVS: RRR, S1/S2 +, no murmurs, no gallops, no carotid bruit.  Pulmonary: Effort and breath sounds normal, no stridor, rhonchi, wheezes, rales.  Abdominal: Soft. BS +, no distension, tenderness, rebound or guarding.  Musculoskeletal: Normal range of motion. No edema and no tenderness.  Lymphadenopathy: No lymphadenopathy noted, cervical, inguinal or axillary Neuro: Alert. Normal reflexes, muscle tone coordination. No cranial nerve deficit. Skin: Skin is warm and dry. No rash noted. Not diaphoretic. No erythema. No pallor. Psychiatric: Normal mood and affect. Behavior, judgment, thought content normal.   Data Review:   CBC Recent Labs  Lab 03/24/21 0054  WBC 15.9*  HGB 7.0*  HCT 19.9*  PLT 598*  MCV 88.4  MCH 31.1  MCHC 35.2  RDW 23.5*  LYMPHSABS 2.9  MONOABS 1.6*  EOSABS 0.0  BASOSABS 0.1   ------------------------------------------------------------------------------------------------------------------  Chemistries  Recent Labs  Lab 03/24/21 0054  NA 134*  K 3.3*  CL 105  CO2 21*  GLUCOSE 106*  BUN 5*  CREATININE 0.34*  CALCIUM 9.2  AST 44*  ALT 16  ALKPHOS 85  BILITOT 2.0*   ------------------------------------------------------------------------------------------------------------------ estimated creatinine clearance is 85.4 mL/min (A) (by C-G formula based on SCr of 0.34 mg/dL (L)). ------------------------------------------------------------------------------------------------------------------ No results for input(s): TSH, T4TOTAL, T3FREE, THYROIDAB in the last 72  hours.  Invalid input(s): FREET3  Coagulation profile No results for input(s): INR, PROTIME in the last 168 hours. ------------------------------------------------------------------------------------------------------------------- No results for input(s): DDIMER in the last 72 hours. -------------------------------------------------------------------------------------------------------------------  Cardiac Enzymes No results for input(s): CKMB, TROPONINI, MYOGLOBIN in the last 168 hours.  Invalid input(s): CK ------------------------------------------------------------------------------------------------------------------ No results found for: BNP  ---------------------------------------------------------------------------------------------------------------  Urinalysis    Component Value Date/Time   COLORURINE AMBER (A) 03/09/2021 0000   APPEARANCEUR HAZY (A) 03/09/2021 0000   LABSPEC 1.020 03/09/2021 0000   PHURINE 6.0 03/09/2021 0000   GLUCOSEU NEGATIVE 03/09/2021 0000   HGBUR LARGE (A) 03/09/2021 0000   BILIRUBINUR NEGATIVE 03/09/2021 0000   BILIRUBINUR negative 11/08/2020 1025   BILIRUBINUR 2+ 05/09/2020 1537   KETONESUR NEGATIVE 03/09/2021 0000   PROTEINUR NEGATIVE 03/09/2021 0000   UROBILINOGEN 1.0 11/08/2020 1025   UROBILINOGEN 1.0 01/13/2014 1612   NITRITE NEGATIVE 03/09/2021 0000   LEUKOCYTESUR SMALL (A) 03/09/2021 0000    ----------------------------------------------------------------------------------------------------------------   Imaging Results:  No results found.   Assessment & Plan:  Active Problems:   Sickle cell pain crisis ([name])   Sickle cell disease with pain crisis: Admit to MedSurg, initiate morphine PCA, full dose being that patient is opiate nave. While in the ER, Dilaudid 1 mg every 2 hours as needed MS IR 15 mg every 4 hours as needed for severe breakthrough pain Toradol 15 mg IV every 6 hours IV fluids, 0.45% saline at 150  mL/h Monitor vital signs very closely, reevaluate pain scale regularly, and supplemental oxygen as needed. Patient will be reevaluated for pain in the context of function and relationship to baseline as her care progresses.  Anemia of chronic disease: Today, patient's hemoglobin is decreased from previous at 7.0 g/dL.  It appears that patient's baseline hemoglobin is 7-8 g/dL.  We will repeat CBC in AM.  If hemoglobin is less than 7, transfuse 1 unit PRBCs. Labs in AM.  Leukocytosis: WBCs mildly elevated at 15.9.  More than likely secondary to sickle cell crisis.  Urinalysis pending.  Respiratory panel pending.  Patient is currently afebrile.  No apparent signs of infection.  Continue to monitor closely.  Thrombocytosis: Platelets 598,000.  Secondary to vaso-occlusive crisis.  Monitor closely.    DVT Prophylaxis: Subcut Lovenox and SCDs  AM Labs Ordered, also please review Full Orders  Family Communication: Admission, patient's condition and plan of care including tests being ordered have been discussed with the patient who indicate understanding and agree with the plan and Code Status.  Code Status: Full Code  Consults called: None    Admission status: Inpatient    Time spent in minutes : 30 minutes  [place], MSN, FNP-C [facility] [address] [place], [place] [address] [phone]  03/24/2021 at 11:02 AM

## 2021-03-24 NOTE — ED Notes (Signed)
IV team nurse at bedside. 

## 2021-03-24 NOTE — ED Provider Notes (Signed)
[facility] DEPT Provider Note   CSN: [id number] Arrival date & time: 03/23/21  2306     History  Chief Complaint  Patient presents with   Sickle Cell Pain Crisis    Alicia Villegas is a 21 y.o. female.  HPI   21 y/o female with a h/o sickle cell anemia, hepatosplenomegaly, tb, subdural hematoma, who presents to the emergency department today for evaluation of sickle cell crisis.  She states yesterday she started having right arm pain.  Is consistent with her sickle cell crisis.  She is not sure what triggered her symptoms.  She tried her home medications without relief.  She denies any fevers, chills, chest pain, shortness of breath abdominal pain nausea vomiting diarrhea.  Home Medications Prior to Admission medications   Medication Sig Start Date End Date Taking? Authorizing Provider  acetaminophen (TYLENOL) 325 MG tablet Take 650 mg by mouth every 6 (six) hours as needed for moderate pain or mild pain.    [provider]  hydroxyurea (HYDREA) 100 mg/mL SUSP Take 1,200 mg by mouth daily.    [provider]  ibuprofen (ADVIL) 200 MG tablet Take 200 mg by mouth every 6 (six) hours as needed for moderate pain or mild pain.    [provider]  oxyCODONE-acetaminophen (PERCOCET) 5-325 MG tablet Take 1 tablet by mouth every 4 (four) hours as needed for severe pain. 03/23/21 03/23/22  Dorena Dew, FNP  voxelotor (OXBRYTA) 500 MG TABS tablet Take 1,500 mg by mouth daily. 10/19/20   [provider]      Allergies    Patient has no known allergies.    Review of Systems   Review of Systems See HPI for pertinent positives or negatives.   Physical Exam Updated Vital Signs BP 117/79 (BP Location: Left Arm)    Pulse 96    Temp 97.7 F (36.5 C) (Oral)    Resp 16    Ht 5\' 1"  (1.549 m)    Wt 48.5 kg    SpO2 95%    BMI 20.22 kg/m  Physical Exam Vitals and nursing note reviewed.  Constitutional:      General: She is not  in acute distress.    Appearance: She is well-developed.  HENT:     Head: Normocephalic and atraumatic.  Eyes:     Conjunctiva/sclera: Conjunctivae normal.  Cardiovascular:     Rate and Rhythm: Normal rate and regular rhythm.     Pulses:          Radial pulses are 2+ on the right side.     Heart sounds: Normal heart sounds. No murmur heard. Pulmonary:     Effort: Pulmonary effort is normal. No respiratory distress.     Breath sounds: Normal breath sounds. No wheezing, rhonchi or rales.  Abdominal:     General: Bowel sounds are normal.     Palpations: Abdomen is soft.     Tenderness: There is no abdominal tenderness. There is no guarding or rebound.  Musculoskeletal:        General: No swelling.     Cervical back: Neck supple.  Skin:    General: Skin is warm and dry.     Capillary Refill: Capillary refill takes less than 2 seconds.  Neurological:     Mental Status: She is alert.  Psychiatric:        Mood and Affect: Mood normal.     ED Results / Procedures / Treatments   Labs (all labs ordered  are listed, but only abnormal results are displayed) Labs Reviewed  COMPREHENSIVE METABOLIC PANEL - Abnormal; Notable for the following components:      Result Value   Sodium 134 (*)    Potassium 3.3 (*)    CO2 21 (*)    Glucose, Bld 106 (*)    BUN 5 (*)    Creatinine, Ser 0.34 (*)    Total Protein 8.4 (*)    AST 44 (*)    Total Bilirubin 2.0 (*)    All other components within normal limits  CBC WITH DIFFERENTIAL/PLATELET - Abnormal; Notable for the following components:   WBC 15.9 (*)    RBC 2.25 (*)    Hemoglobin 7.0 (*)    HCT 19.9 (*)    RDW 23.5 (*)    Platelets 598 (*)    nRBC 1.1 (*)    Neutro Abs 11.1 (*)    Monocytes Absolute 1.6 (*)    Abs Immature Granulocytes 0.21 (*)    All other components within normal limits  RETICULOCYTES - Abnormal; Notable for the following components:   Retic Ct Pct 9.4 (*)    RBC. 2.25 (*)    Retic Count, Absolute 211.1 (*)     Immature Retic Fract 39.4 (*)    All other components within normal limits  I-STAT BETA HCG BLOOD, ED ([facility], [facility], [facility] ONLY)    EKG None  Radiology No results found.  Procedures Procedures    Medications Ordered in ED Medications  morphine (MSIR) tablet 15 mg (15 mg Oral Patient Refused/Not Given 03/24/21 0422)  potassium chloride SA (KLOR-CON M) CR tablet 40 mEq ( Oral Canceled Entry 03/24/21 0422)  0.45 % sodium chloride infusion (has no administration in time range)  HYDROmorphone (DILAUDID) injection 1 mg (has no administration in time range)  ketorolac (TORADOL) 15 MG/ML injection 15 mg (has no administration in time range)  HYDROmorphone (DILAUDID) injection 0.5 mg (0.5 mg Subcutaneous Given 03/24/21 0122)  HYDROmorphone (DILAUDID) injection 0.5 mg (0.5 mg Subcutaneous Given 03/24/21 0220)  ondansetron (ZOFRAN-ODT) disintegrating tablet 4 mg (4 mg Oral Given 03/24/21 0123)    ED Course/ Medical Decision Making/ A&P                           Medical Decision Making Amount and/or Complexity of Data Reviewed Labs: ordered.  Risk Prescription drug management.   This patient presents to the ED for concern of sickle cell crisis, this involves an extensive number of treatment options, and is a complaint that carries with it a high risk of complications and morbidity.  The differential diagnosis includes but is not limited to sickle cell crisis, chronic pain, limb ischemic, vte   Comorbidities that complicate the patient evaluation: Patients presentation is complicated by their history of sickle cell anemia, multiple comorbidities  Additional history obtained: Records reviewed previous admission documents and Care Everywhere/External Records  Lab Tests: I Ordered, and personally interpreted labs.  The pertinent results include:   CBC with leukocytosis, anemia present which is slightly worse from prior (does not receive transfusions until 6.0) CMP with mild hyponatremia, mild  hypokalemia, cr wnl, lfts nonconcerning, bili elevated Retic count with elevated retic counts Beta hcg wnl  Cardiac Monitoring: The patient was maintained on a cardiac monitor.  I personally viewed and interpreted the cardiac monitor which showed an underlying rhythm of:  sinus rhythm  Medicines ordered and prescription drug management: I ordered medication including dilaudid  for pain  Reevaluation of the patient after these medicines showed that the patient    stayed the same  There was a significant delay in care as pt was placed in a room with inappropriate staffing and therefore her port was not able to be accessed and nursing was not able to place an IV for several hours.  Complexity of problems addressed: Patients presentation is most consistent with  acute complicated illness/injury requiring diagnostic workup  At shift change, care transitioned to Domenic Moras [facility] with plan to recheck pt after pain meds and determine disposition.    Final Clinical Impression(s) / ED Diagnoses Final diagnoses:  Sickle cell pain crisis [facility])    Rx / DC Orders ED Discharge Orders     None         Bishop Dublin 03/24/21 0228    Quintella Reichert, MD 03/27/21 [phone]

## 2021-03-25 DIAGNOSIS — D5700 Hb-SS disease with crisis, unspecified: Secondary | ICD-10-CM | POA: Diagnosis not present

## 2021-03-25 MED ORDER — ACETAMINOPHEN 325 MG PO TABS
650.0000 mg | ORAL_TABLET | Freq: Once | ORAL | Status: AC
  Administered 2021-03-25: 650 mg via ORAL
  Filled 2021-03-25: qty 2

## 2021-03-25 MED ORDER — MORPHINE SULFATE (PF) 2 MG/ML IV SOLN
2.0000 mg | INTRAVENOUS | Status: DC | PRN
  Administered 2021-03-25 – 2021-03-27 (×13): 2 mg via INTRAVENOUS
  Filled 2021-03-25 (×13): qty 1

## 2021-03-25 MED ORDER — SODIUM CHLORIDE 0.45 % IV SOLN: INTRAVENOUS | Status: AC

## 2021-03-25 NOTE — Plan of Care (Signed)
Yellow MEWS, see Flowsheets Temp 100.5, HR 114  Dr. Jonelle Sidle, MD notified No new orders at this time, will continue to monitor.  Ethlyn Daniels, RN, BSN

## 2021-03-25 NOTE — Progress Notes (Signed)
SICKLE CELL SERVICE PROGRESS NOTE  [name] CNO:[id number] DOB: [birth year] DOA: 03/23/2021 PCP: Dorena Dew, FNP  Assessment/Plan: Principal Problem:   Sickle cell pain crisis ([place]) Active Problems:   Leukocytosis   Thrombocytosis  Sickle cell pain crisis: Patient has not done well with Dilaudid PCA.  Apparently it never works for her.  Discontinue Dilaudid PCA and start her on IV morphine.  That seems to have helped her in the past. Leukocytosis: Secondary to sickle cell vaso-occlusive crisis.  Continue to monitor. 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  Continue to monitor. Hypokalemia: Replete potassium  Code Status: Full Family Communication: Care advocate at bedside Disposition Plan: [place]  Pager [phone]. If 7PM-7AM, please contact night-coverage.  03/25/2021, 1:27 PM  LOS: 1 day   Brief narrative:  Alicia Villegas  is a 21 y.o. female with a medical history significant for sickle cell disease presented to the emergency department with complaints of bilateral arm pain that has been present over the past 24 hours.  Arm pain is consistent with her previous sickle cell pain crisis.  Patient attributes this crisis to changes in weather.  She notified her sickle cell provider on yesterday and Percocet 5-325 mg every 4 hours was sent to her pharmacy.  Patient states that she was taking medication consistently without any relief.  She currently rates her pain as 10/10 and is very tearful.  She denies any headache, chest pain, urinary symptoms.  No fever, or chills.  No urinary symptoms, nausea, vomiting, or diarrhea.  Consultants: None  Procedures: None  Antibiotics: None  HPI/Subjective: Patient having 7/10 pain.  Patient has not done well with Dilaudid PCA.  Morphine works better for her.  She is also on oral oxycodone.  She is slightly tachycardic.  Objective: Vitals:   03/25/21 0646 03/25/21 0649 03/25/21 0738 03/25/21 0951  BP:  110/66   104/61  Pulse:  (!) 110  (!) 106  Resp: 18 18 18    Temp:  99.2 F (37.3 C)  98.2 F (36.8 C)  TempSrc:  Oral  Oral  SpO2: 95% 97% 96% 92%  Weight:      Height:       Weight change: 0.265 kg  Intake/Output Summary (Last 24 hours) at 03/25/2021 1327 Last data filed at 03/25/2021 0449 Gross per 24 hour  Intake 1574.98 ml  Output --  Net 1574.98 ml    General: Alert, awake, oriented x3, in no acute distress.  Small frame HEENT: [place]/AT PEERL, EOMI Neck: Trachea midline,  no masses, no thyromegal,y no JVD, no carotid bruit OROPHARYNX:  Moist, No exudate/ erythema/lesions.  Heart: Sinus tachycardia, without murmurs, rubs, gallops, PMI non-displaced, no heaves or thrills on palpation.  Lungs: Clear to auscultation, no wheezing or rhonchi noted. No increased vocal fremitus resonant to percussion  Abdomen: Soft, nontender, nondistended, positive bowel sounds, no masses no hepatosplenomegaly noted..  Neuro: No focal neurological deficits noted cranial nerves II through XII grossly intact. DTRs 2+ bilaterally upper and lower extremities. Strength 5 out of 5 in bilateral upper and lower extremities. Musculoskeletal: No warm swelling or erythema around joints, no spinal tenderness noted. Psychiatric: Patient alert and oriented x3, good insight and cognition, good recent to remote recall. Lymph node survey: No cervical axillary or inguinal lymphadenopathy noted.   Data Reviewed: Basic Metabolic Panel: Recent Labs  Lab 03/24/21 0054  NA 134*  K 3.3*  CL 105  CO2 21*  GLUCOSE 106*  BUN 5*  CREATININE 0.34*  CALCIUM 9.2  Liver Function Tests: Recent Labs  Lab 03/24/21 0054  AST 44*  ALT 16  ALKPHOS 85  BILITOT 2.0*  PROT 8.4*  ALBUMIN 4.5   No results for input(s): LIPASE, AMYLASE in the last 168 hours. No results for input(s): AMMONIA in the last 168 hours. CBC: Recent Labs  Lab 03/24/21 0054  WBC 15.9*  NEUTROABS 11.1*  HGB 7.0*  HCT 19.9*  MCV 88.4  PLT 598*    Cardiac Enzymes: No results for input(s): CKTOTAL, CKMB, CKMBINDEX, TROPONINI in the last 168 hours. BNP (last 3 results) No results for input(s): BNP in the last 8760 hours.  ProBNP (last 3 results) No results for input(s): PROBNP in the last 8760 hours.  CBG: No results for input(s): GLUCAP in the last 168 hours.  No results found for this or any previous visit (from the past 240 hour(s)).   Studies: DG Chest 2 View  Result Date: 03/08/2021 CLINICAL DATA:  Cough, sickle cell. EXAM: CHEST - 2 VIEW COMPARISON:  Chest x-ray 02/21/2018. FINDINGS: Heart is mildly enlarged, unchanged. The lungs are clear. There is no pleural effusion or pneumothorax identified. The osseous structures are within normal limits. IMPRESSION: 1. No acute cardiopulmonary process. 2. Stable cardiomegaly. Electronically Signed   By: Ronney Asters M.D.   On: 03/08/2021 19:36    Scheduled Meds:  enoxaparin (LOVENOX) injection  30 mg Subcutaneous Q24H   hydroxyurea  1,200 mg Oral Daily   ketorolac  15 mg Intravenous Q6H   senna-docusate  1 tablet Oral BID   voxelotor  1,500 mg Oral Daily   Continuous Infusions:

## 2021-03-25 NOTE — Progress Notes (Addendum)
At 1904 report got from day shift nurse, noted at 1710 patient had temp. 100.5 with P 114, rechecked VS at 1917 Temp. 99.8 with P 110. On call provided Jeannette Corpus and charged nurse notified. Will continue to monitor.

## 2021-03-26 ENCOUNTER — Inpatient Hospital Stay: Payer: Self-pay

## 2021-03-26 DIAGNOSIS — D5700 Hb-SS disease with crisis, unspecified: Secondary | ICD-10-CM | POA: Diagnosis not present

## 2021-03-26 DIAGNOSIS — E876 Hypokalemia: Secondary | ICD-10-CM | POA: Diagnosis present

## 2021-03-26 MED ORDER — ACETAMINOPHEN 325 MG PO TABS
650.0000 mg | ORAL_TABLET | Freq: Once | ORAL | Status: AC
  Administered 2021-03-26: 650 mg via ORAL
  Filled 2021-03-26: qty 2

## 2021-03-26 MED ORDER — OXYCODONE-ACETAMINOPHEN 5-325 MG PO TABS
1.0000 | ORAL_TABLET | ORAL | Status: DC | PRN
  Administered 2021-03-26: 1 via ORAL
  Filled 2021-03-26 (×3): qty 1

## 2021-03-26 MED ORDER — ACETAMINOPHEN 325 MG PO TABS
650.0000 mg | ORAL_TABLET | Freq: Once | ORAL | Status: AC
  Administered 2021-03-27: 650 mg via ORAL
  Filled 2021-03-26: qty 2

## 2021-03-26 NOTE — Plan of Care (Signed)
Jonelle Sidle, MD notified of PICC refusal , Percocet refusal, and request of Tylenol. No new orders at this time Will continue to monitor patient.  Ethlyn Daniels, RN, BSN

## 2021-03-26 NOTE — Progress Notes (Addendum)
At bedside for PICC placement per request. Pt refusing PICC placement at  this time.  RN notified, states pt was agreeable 10 minutes Prior to PICC team arrival.  Pt states they have no problem with her lab draws: RN states pt has been refusing labs.  Pt states she will be leaving the hospital soon and does not need the IV fluids or medications and does not want the PICC. Pt does not offer a specific reason for refusal. Did agree to assess for PIV.  No suitable veins BUE for PIV placement. Pt in fetal position without offering extension of extremities for assessment. Pt verbalizes understanding of no IV access for any IV medications, fluids, blood products or lab specimens.  Anderson Malta RN to notify MD.

## 2021-03-26 NOTE — Care Plan (Signed)
Patient c/o pain in IV site, appears infiltrated. Fluids stopped, IV removed IV team attempted to place midline w/o success. Dr. Jonelle Sidle contacted requesting PICC line placement, no new orders at this time. Patient given PO pain medication. Will continue to monitor.  Ethlyn Daniels, RN, BSN

## 2021-03-26 NOTE — Progress Notes (Signed)
SICKLE CELL SERVICE PROGRESS NOTE  Alicia Villegas WGY:[id number] DOB: Feb 04, 2001 DOA: 03/23/2021 PCP: Dorena Dew, FNP  Assessment/Plan: Principal Problem:   Sickle cell pain crisis ([place]) Active Problems:   Leukocytosis   Thrombocytosis   Hypokalemia  Sickle cell pain crisis: Patient slowly improving with IV morphine.  I will restart her oral Roxicodone today.  Continue close monitoring.  Probably MS IR will be better.  Continue IV fluids.  Continue supportive care leukocytosis: Secondary to sickle cell vaso-occlusive crisis.  Continue to monitor. 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  Continue to monitor. Hypokalemia: Replete potassium  Code Status: Full Family Communication: Care advocate at bedside Disposition Plan: [place]  Pager [phone]. If 7PM-7AM, please contact night-coverage.  03/26/2021, 11:28 AM  LOS: 2 days   Brief narrative:  Alicia Villegas  is a 21 y.o. female with a medical history significant for sickle cell disease presented to the emergency department with complaints of bilateral arm pain that has been present over the past 24 hours.  Arm pain is consistent with her previous sickle cell pain crisis.  Patient attributes this crisis to changes in weather.  She notified her sickle cell provider on yesterday and Percocet 5-325 mg every 4 hours was sent to her pharmacy.  Patient states that she was taking medication consistently without any relief.  She currently rates her pain as 10/10 and is very tearful.  She denies any headache, chest pain, urinary symptoms.  No fever, or chills.  No urinary symptoms, nausea, vomiting, or diarrhea.  Consultants: None  Procedures: None  Antibiotics: None  HPI/Subjective: Patient still having 7/10 pain.  She believes it is better than yesterday.  Not back to baseline however.  Objective: Vitals:   03/25/21 2159 03/26/21 0055 03/26/21 0646 03/26/21 0946  BP: 115/73 126/80 117/77 104/60  Pulse: (!)  106 100 95 (!) 103  Resp: 18 16 16 15   Temp: 99.4 F (37.4 C) 99.1 F (37.3 C) 98.5 F (36.9 C) 98.4 F (36.9 C)  TempSrc: Oral Oral Oral Oral  SpO2: 100% 94% 98% 96%  Weight:      Height:       Weight change:   Intake/Output Summary (Last 24 hours) at 03/26/2021 1128 Last data filed at 03/25/2021 2120 Gross per 24 hour  Intake 240 ml  Output --  Net 240 ml    General: Alert, awake, oriented x3, in no acute distress.  Small frame HEENT: [place]/AT PEERL, EOMI Neck: Trachea midline,  no masses, no thyromegal,y no JVD, no carotid bruit OROPHARYNX:  Moist, No exudate/ erythema/lesions.  Heart: Sinus tachycardia, without murmurs, rubs, gallops, PMI non-displaced, no heaves or thrills on palpation.  Lungs: Clear to auscultation, no wheezing or rhonchi noted. No increased vocal fremitus resonant to percussion  Abdomen: Soft, nontender, nondistended, positive bowel sounds, no masses no hepatosplenomegaly noted..  Neuro: No focal neurological deficits noted cranial nerves II through XII grossly intact. DTRs 2+ bilaterally upper and lower extremities. Strength 5 out of 5 in bilateral upper and lower extremities. Musculoskeletal: No warm swelling or erythema around joints, no spinal tenderness noted. Psychiatric: Patient alert and oriented x3, good insight and cognition, good recent to remote recall. Lymph node survey: No cervical axillary or inguinal lymphadenopathy noted.   Data Reviewed: Basic Metabolic Panel: Recent Labs  Lab 03/24/21 0054  NA 134*  K 3.3*  CL 105  CO2 21*  GLUCOSE 106*  BUN 5*  CREATININE 0.34*  CALCIUM 9.2   Liver Function Tests: Recent Labs  Lab 03/24/21 0054  AST 44*  ALT 16  ALKPHOS 85  BILITOT 2.0*  PROT 8.4*  ALBUMIN 4.5   No results for input(s): LIPASE, AMYLASE in the last 168 hours. No results for input(s): AMMONIA in the last 168 hours. CBC: Recent Labs  Lab 03/24/21 0054  WBC 15.9*  NEUTROABS 11.1*  HGB 7.0*  HCT 19.9*  MCV 88.4   PLT 598*   Cardiac Enzymes: No results for input(s): CKTOTAL, CKMB, CKMBINDEX, TROPONINI in the last 168 hours. BNP (last 3 results) No results for input(s): BNP in the last 8760 hours.  ProBNP (last 3 results) No results for input(s): PROBNP in the last 8760 hours.  CBG: No results for input(s): GLUCAP in the last 168 hours.  No results found for this or any previous visit (from the past 240 hour(s)).   Studies: DG Chest 2 View  Result Date: 03/08/2021 CLINICAL DATA:  Cough, sickle cell. EXAM: CHEST - 2 VIEW COMPARISON:  Chest x-ray 02/21/2018. FINDINGS: Heart is mildly enlarged, unchanged. The lungs are clear. There is no pleural effusion or pneumothorax identified. The osseous structures are within normal limits. IMPRESSION: 1. No acute cardiopulmonary process. 2. Stable cardiomegaly. Electronically Signed   By: Ronney Asters M.D.   On: 03/08/2021 19:36    Scheduled Meds:  enoxaparin (LOVENOX) injection  30 mg Subcutaneous Q24H   hydroxyurea  1,200 mg Oral Daily   ketorolac  15 mg Intravenous Q6H   senna-docusate  1 tablet Oral BID   voxelotor  1,500 mg Oral Daily   Continuous Infusions:  sodium chloride 150 mL/hr at 03/26/21 1005

## 2021-03-26 NOTE — Progress Notes (Signed)
Patient refused percocet and asking tylenol, last time tylenol administered at [place], on call provider notified for next dose, order received. Patient is sleeping at this time, will continue to monitor.

## 2021-03-27 DIAGNOSIS — D5700 Hb-SS disease with crisis, unspecified: Secondary | ICD-10-CM | POA: Diagnosis not present

## 2021-03-27 LAB — TYPE AND SCREEN
ABO/RH(D): AB POS
Antibody Screen: POSITIVE
Unit division: 0

## 2021-03-27 LAB — BPAM RBC
Blood Product Expiration Date: 202303272359
Unit Type and Rh: 5100

## 2021-03-27 MED ORDER — NALOXONE HCL 0.4 MG/ML IJ SOLN: 0.4000 mg | INTRAMUSCULAR | Status: DC | PRN

## 2021-03-27 MED ORDER — SODIUM CHLORIDE 0.9% FLUSH: 10.0000 mL | INTRAVENOUS | Status: DC | PRN

## 2021-03-27 MED ORDER — MORPHINE SULFATE 1 MG/ML IV SOLN PCA
INTRAVENOUS | Status: DC
  Administered 2021-03-27: 10.5 mg via INTRAVENOUS
  Administered 2021-03-28: 4.5 mg via INTRAVENOUS
  Administered 2021-03-28: 1.5 mg via INTRAVENOUS
  Administered 2021-03-28: 3 mg via INTRAVENOUS
  Filled 2021-03-27: qty 30

## 2021-03-27 MED ORDER — DIPHENHYDRAMINE HCL 12.5 MG/5ML PO ELIX: 12.5000 mg | ORAL_SOLUTION | Freq: Four times a day (QID) | ORAL | Status: DC | PRN

## 2021-03-27 MED ORDER — ONDANSETRON HCL 4 MG/2ML IJ SOLN: 4.0000 mg | Freq: Four times a day (QID) | INTRAMUSCULAR | Status: DC | PRN

## 2021-03-27 MED ORDER — SODIUM CHLORIDE 0.9% FLUSH: 9.0000 mL | INTRAVENOUS | Status: DC | PRN

## 2021-03-27 NOTE — Progress Notes (Signed)
Subjective: Alicia Villegas is a 21 year old female with a medical history significant for sickle cell disease that was admitted and sickle cell pain crisis. Patient states that pain intensity has not improved.  She rates right shoulder pain as 6/10.  She denies any headache, chest pain, urinary symptoms, nausea, vomiting, or diarrhea.  Objective:  Vital signs in last 24 hours:  Vitals:   03/27/21 0331 03/27/21 0937 03/27/21 1329 03/27/21 1419  BP: 111/60 112/72 110/66   Pulse: (!) 103 (!) 103 (!) 111   Resp:  16 16 15   Temp: 98.4 F (36.9 C) 98.5 F (36.9 C) 99.2 F (37.3 C)   TempSrc: Oral Oral Oral   SpO2: 96% 96% 98% 98%  Weight:      Height:        Intake/Output from previous day:   Intake/Output Summary (Last 24 hours) at 03/27/2021 1731 Last data filed at 03/27/2021 0018 Gross per 24 hour  Intake 880 ml  Output --  Net 880 ml    Physical Exam: General: Alert, awake, oriented x3, in no acute distress.  HEENT: [place]/AT PEERL, EOMI Neck: Trachea midline,  no masses, no thyromegal,y no JVD, no carotid bruit OROPHARYNX:  Moist, No exudate/ erythema/lesions.  Heart: Regular rate and rhythm, without murmurs, rubs, gallops, PMI non-displaced, no heaves or thrills on palpation.  Lungs: Clear to auscultation, no wheezing or rhonchi noted. No increased vocal fremitus resonant to percussion  Abdomen: Soft, nontender, nondistended, positive bowel sounds, no masses no hepatosplenomegaly noted..  Neuro: No focal neurological deficits noted cranial nerves II through XII grossly intact. DTRs 2+ bilaterally upper and lower extremities. Strength 5 out of 5 in bilateral upper and lower extremities. Musculoskeletal: No warm swelling or erythema around joints, no spinal tenderness noted. Psychiatric: Patient alert and oriented x3, good insight and cognition, good recent to remote recall. Lymph node survey: No cervical axillary or inguinal lymphadenopathy noted.  Lab Results:  Basic Metabolic  Panel:    Component Value Date/Time   NA 134 (L) 03/24/2021 0054   NA 139 11/08/2020 1100   K 3.3 (L) 03/24/2021 0054   CL 105 03/24/2021 0054   CO2 21 (L) 03/24/2021 0054   BUN 5 (L) 03/24/2021 0054   BUN 5 (L) 11/08/2020 1100   CREATININE 0.34 (L) 03/24/2021 0054   CREATININE 0.30 (L) 05/02/2015 1442   GLUCOSE 106 (H) 03/24/2021 0054   CALCIUM 9.2 03/24/2021 0054   CBC:    Component Value Date/Time   WBC 15.9 (H) 03/24/2021 0054   HGB 7.0 (L) 03/24/2021 0054   HGB 7.7 (L) 11/08/2020 1100   HCT 19.9 (L) 03/24/2021 0054   HCT 23.2 (L) 11/08/2020 1100   PLT 598 (H) 03/24/2021 0054   PLT 665 (H) 11/08/2020 1100   MCV 88.4 03/24/2021 0054   MCV 91 11/08/2020 1100   NEUTROABS 11.1 (H) 03/24/2021 0054   NEUTROABS 4.6 11/08/2020 1100   LYMPHSABS 2.9 03/24/2021 0054   LYMPHSABS 3.0 11/08/2020 1100   MONOABS 1.6 (H) 03/24/2021 0054   EOSABS 0.0 03/24/2021 0054   EOSABS 0.1 11/08/2020 1100   BASOSABS 0.1 03/24/2021 0054   BASOSABS 0.1 11/08/2020 1100    No results found for this or any previous visit (from the past 240 hour(s)).  Studies/Results: [place] EKG SITE RITE  Result Date: 03/26/2021 If Site Rite image not attached, placement could not be confirmed due to current cardiac rhythm.   Medications: Scheduled Meds:  enoxaparin (LOVENOX) injection  30 mg Subcutaneous Q24H  hydroxyurea  1,200 mg Oral Daily   ketorolac  15 mg Intravenous Q6H   morphine   Intravenous Q4H   senna-docusate  1 tablet Oral BID   voxelotor  1,500 mg Oral Daily   Continuous Infusions: PRN Meds:.diphenhydrAMINE, naloxone **AND** sodium chloride flush, ondansetron (ZOFRAN) IV, oxyCODONE-acetaminophen, polyethylene glycol  Consultants: none  Procedures: none  Antibiotics: none  Assessment/Plan: Principal Problem:   Sickle cell pain crisis ([name]) Active Problems:   Leukocytosis   Thrombocytosis   Hypokalemia  Sickle cell disease with pain crisis: Pain intensity is not improving with  IV morphine.  Restart morphine PCA, full dose IV fluids at [facility] Toradol 15 mg IV every 6 hours Monitor vital signs very closely, reevaluate pain scale regularly, and supplemental oxygen as needed.  Leukocytosis: Mild leukocytosis, more than likely secondary to vaso-occlusive crisis.  Monitor closely.  Thrombocytosis: Stable.  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  Continue to monitor.  Hypokalemia: Replete potassium.  Follow labs.  Code Status: Full Code Family Communication: N/A Disposition Plan: Not yet ready for discharge  [place], MSN, FNP-C Patient [place] [address] [place],  [address] [phone]  If 5PM-8AM, please contact night-coverage.  03/27/2021, 5:31 PM  LOS: 3 days

## 2021-03-27 NOTE — Progress Notes (Signed)
°   03/26/21 1912  Assess: MEWS Score  Temp (!) 100.7 F (38.2 C)  Level of Consciousness Alert  Assess: if the MEWS score is Yellow or Red  Were vital signs taken at a resting state? Yes  Focused Assessment Change from prior assessment (see assessment flowsheet)  Does the patient meet 2 or more of the SIRS criteria? No  MEWS guidelines implemented *See Row Information* Yes (patient was shaking in bed, noted temp 100.7, tylenol administred per oncall provider order, PO fluid encouraged, Patient accepted, recheck temp. 99.3)  Treat  MEWS Interventions Escalated (See documentation below)  Pain Scale 0-10  Pain Score 2  Faces Pain Scale 2  Pain Type Acute pain  Pain Location Head (c/o headache, increase temp.)  Pain Frequency Intermittent  Pain Onset Gradual  Patients Stated Pain Goal 0  Pain Intervention(s) Medication (See eMAR)  Multiple Pain Sites No  Complains of Fever  Patients response to intervention Effective  Take Vital Signs  Increase Vital Sign Frequency  Yellow: Q 2hr X 2 then Q 4hr X 2, if remains yellow, continue Q 4hrs  Escalate  MEWS: Escalate Yellow: discuss with charge nurse/RN and consider discussing with provider and RRT  Notify: Charge Nurse/RN  Name of Charge Nurse/RN Notified Meredith RN  Date Charge Nurse/RN Notified 03/26/21  Time Charge Nurse/RN Notified 1913  Notify: Provider  Provider Name/Title X.Blount, NP (oncall)  Date Provider Notified 03/26/21  Time Provider Notified 1959  Notification Type Page  Notification Reason Change in status  Provider response See new orders  Date of Provider Response 03/26/21  Time of Provider Response 1214  Assess: SIRS CRITERIA  SIRS Temperature  0  SIRS Pulse 1  SIRS Respirations  0  SIRS WBC 0  SIRS Score Sum  1

## 2021-03-27 NOTE — Progress Notes (Signed)
°   03/27/21 1329  Assess: MEWS Score  Temp 99.2 F (37.3 C)  BP 110/66  Pulse Rate (!) 111  Resp 16  SpO2 98 %  O2 Device Room Air  Assess: MEWS Score  MEWS Temp 0  MEWS Systolic 0  MEWS Pulse 2  MEWS RR 0  MEWS LOC 0  MEWS Score 2  MEWS Score Color Yellow  Assess: if the MEWS score is Yellow or Red  Were vital signs taken at a resting state? Yes  Focused Assessment Change from prior assessment (see assessment flowsheet)  Does the patient meet 2 or more of the SIRS criteria? No  MEWS guidelines implemented *See Row Information* Yes  Treat  MEWS Interventions Administered scheduled meds/treatments  Take Vital Signs  Increase Vital Sign Frequency  Yellow: Q 2hr X 2 then Q 4hr X 2, if remains yellow, continue Q 4hrs  Escalate  MEWS: Escalate Yellow: discuss with charge nurse/RN and consider discussing with provider and RRT  Notify: Charge Nurse/RN  Name of Charge Nurse/RN Notified Lottie Dawson ,RN  Date Charge Nurse/RN Notified 03/27/21  Time Charge Nurse/RN Notified 1330  Notify: Provider  Provider Name/Title [place] Hollis,NP  Date Provider Notified 03/27/21  Time Provider Notified 1330  Notification Type Face-to-face  Notification Reason Change in status  Provider response At bedside  Time of Provider Response 1330  Document  Progress note created (see row info) Yes  Assess: SIRS CRITERIA  SIRS Temperature  0  SIRS Pulse 1  SIRS Respirations  0  SIRS WBC 0  SIRS Score Sum  1

## 2021-03-28 DIAGNOSIS — D72829 Elevated white blood cell count, unspecified: Secondary | ICD-10-CM

## 2021-03-28 DIAGNOSIS — D75839 Thrombocytosis, unspecified: Secondary | ICD-10-CM

## 2021-03-28 DIAGNOSIS — E876 Hypokalemia: Secondary | ICD-10-CM | POA: Diagnosis not present

## 2021-03-28 DIAGNOSIS — D5700 Hb-SS disease with crisis, unspecified: Secondary | ICD-10-CM | POA: Diagnosis not present

## 2021-03-28 NOTE — Discharge Summary (Signed)
Physician Discharge Summary  Alicia Villegas HQR:[id number] DOB: 27-Aug-2000 DOA: 03/23/2021  PCP: Dorena Dew, FNP  Admit date: 03/23/2021  Discharge date: 03/28/2021  Discharge Diagnoses:  Principal Problem:   Sickle cell pain crisis ([place]) Active Problems:   Leukocytosis   Thrombocytosis   Hypokalemia   Discharge Condition: Stable  Disposition:   Follow-up Information     Dorena Dew, FNP. Schedule an appointment as soon as possible for a visit in 1 week(s).   Specialty: Family Medicine Contact information: [place]. [place] [address] [phone]                Pt is discharged home in good condition and is to follow up with Dorena Dew, FNP this week to have labs evaluated. [name] is instructed to increase activity slowly and balance with rest for the next few days, and use prescribed medication to complete treatment of pain  Diet: Regular Wt Readings from Last 3 Encounters:  03/24/21 48.8 kg (12 %, Z= -1.17)*  03/10/21 48.9 kg (13 %, Z= -1.15)*  11/08/20 49.3 kg (15 %, Z= -1.05)*   * Growth percentiles are based on CDC (Girls, 2-20 Years) data.    History of present illness:  Alicia Villegas  is a 21 y.o. female with a medical history significant for sickle cell disease presented to the emergency department with complaints of bilateral arm pain that has been present over the past 24 hours.  Arm pain is consistent with her previous sickle cell pain crisis. Patient attributes this crisis to changes in weather.  She notified her sickle cell provider on yesterday and Percocet 5-325 mg every 4 hours was sent to her pharmacy.  Patient states that she was taking medication consistently without any relief.  She currently rates her pain as 10/10 and is very tearful.  She denies any headache, chest pain, urinary symptoms.  No fever, or chills.  No urinary symptoms, nausea, vomiting, or diarrhea.  ER course: Patient's vital signs are  recorded as:BP 112/67 (BP Location: Left Arm)    Pulse 87    Temp 97.7 F (36.5 C) (Oral)    Resp 16    Ht 5\' 1"  (1.549 m)    Wt 48.5 kg    SpO2 98%    BMI 20.22 kg/m  CBC shows that patient's hemoglobin is 7.0 g/dL, decreased from previous.  WBCs mildly elevated at 15.9.  Platelets 598,000.  Beta-hCG negative.  Reticulocytes elevated at 211.  Respiratory panel pending.  Patient's pain persists despite IV Dilaudid, IV Toradol, and IV fluids.  Patient will be admitted to [facility] for further management of her sickle cell pain crisis.    Hospital Course:  Patient was admitted for sickle cell pain crisis and managed appropriately with IVF, IV Dilaudid via PCA and IV Toradol, as well as other adjunct therapies per sickle cell pain management protocols.  IV Dilaudid PCA was slowly weaned off and patient was successfully transitioned to her home pain medications.  Her hemoglobin dropped to another of 7 and she did not require blood transfusion at this time.  Of note patient was transfused 2 units of packed red blood cells in her previous admission 2 weeks ago.  Patient did well on above regimen, as of today her vital signs within normal limits, she is ambulating well with no significant pain, she is tolerating p.o. intake with no restrictions.  Pain intensity has returned to baseline.  She has no new development.  Patient is oriented to time place and person. Patient was therefore discharged home today in a hemodynamically stable condition.   Shron will follow-up with PCP within 1 week of this discharge. Treena was counseled extensively about nonpharmacologic means of pain management, patient verbalized understanding and was appreciative of  the care received during this admission.   We discussed the need for good hydration, monitoring of hydration status, avoidance of heat, cold, stress, and infection triggers. We discussed the need to be adherent with taking Hydrea and other home medications. Patient was  reminded of the need to seek medical attention immediately if any symptom of bleeding, anemia, or infection occurs.  Discharge Exam: Vitals:   03/28/21 0933 03/28/21 1011  BP: 105/64   Pulse: 92   Resp: 16 16  Temp: 98.6 F (37 C)   SpO2: 98% 98%   Vitals:   03/28/21 0405 03/28/21 0600 03/28/21 0933 03/28/21 1011  BP:  (!) 98/59 105/64   Pulse:  98 92   Resp: 16  16 16   Temp:  98.8 F (37.1 C) 98.6 F (37 C)   TempSrc:  Oral Oral   SpO2: 98% 98% 98% 98%  Weight:      Height:        General appearance : Awake, alert, not in any distress. Speech Clear. Not toxic looking HEENT: Atraumatic and Normocephalic, pupils equally reactive to light and accomodation Neck: Supple, no JVD. No cervical lymphadenopathy.  Chest: Good air entry bilaterally, no added sounds  CVS: S1 S2 regular, no murmurs.  Abdomen: Bowel sounds present, Non tender and not distended with no gaurding, rigidity or rebound. Extremities: B/L Lower Ext shows no edema, both legs are warm to touch Neurology: Awake alert, and oriented X 3, CN II-XII intact, Non focal Skin: No Rash  Discharge Instructions  Discharge Instructions     Diet - low sodium heart healthy   Complete by: As directed    Increase activity slowly   Complete by: As directed       Allergies as of 03/28/2021   No Known Allergies      Medication List     TAKE these medications    acetaminophen 325 MG tablet Commonly known as: TYLENOL Take 650 mg by mouth every 6 (six) hours as needed for moderate pain or mild pain.   hydroxyurea 100 mg/mL Susp Commonly known as: HYDREA Take 1,200 mg by mouth daily.   ibuprofen 200 MG tablet Commonly known as: ADVIL Take 400 mg by mouth every 6 (six) hours as needed for moderate pain or mild pain.   oxyCODONE-acetaminophen 5-325 MG tablet Commonly known as: Percocet Take 1 tablet by mouth every 4 (four) hours as needed for severe pain.   voxelotor 500 MG Tabs tablet Commonly known as:  OXBRYTA Take 1,500 mg by mouth daily.        The results of significant diagnostics from this hospitalization (including imaging, microbiology, ancillary and laboratory) are listed below for reference.    Significant Diagnostic Studies: DG Chest 2 View  Result Date: 03/08/2021 CLINICAL DATA:  Cough, sickle cell. EXAM: CHEST - 2 VIEW COMPARISON:  Chest x-ray 02/21/2018. FINDINGS: Heart is mildly enlarged, unchanged. The lungs are clear. There is no pleural effusion or pneumothorax identified. The osseous structures are within normal limits. IMPRESSION: 1. No acute cardiopulmonary process. 2. Stable cardiomegaly. Electronically Signed   By: Ronney Asters M.D.   On: 03/08/2021 19:36   [place] EKG SITE RITE  Result Date: 03/26/2021 If Site  Rite image not attached, placement could not be confirmed due to current cardiac rhythm.   Microbiology: No results found for this or any previous visit (from the past 240 hour(s)).   Labs: Basic Metabolic Panel: Recent Labs  Lab 03/24/21 0054  NA 134*  K 3.3*  CL 105  CO2 21*  GLUCOSE 106*  BUN 5*  CREATININE 0.34*  CALCIUM 9.2   Liver Function Tests: Recent Labs  Lab 03/24/21 0054  AST 44*  ALT 16  ALKPHOS 85  BILITOT 2.0*  PROT 8.4*  ALBUMIN 4.5   No results for input(s): LIPASE, AMYLASE in the last 168 hours. No results for input(s): AMMONIA in the last 168 hours. CBC: Recent Labs  Lab 03/24/21 0054  WBC 15.9*  NEUTROABS 11.1*  HGB 7.0*  HCT 19.9*  MCV 88.4  PLT 598*   Cardiac Enzymes: No results for input(s): CKTOTAL, CKMB, CKMBINDEX, TROPONINI in the last 168 hours. BNP: Invalid input(s): POCBNP CBG: No results for input(s): GLUCAP in the last 168 hours.  Time coordinating discharge: 50 minutes  Signed:  [facility] 03/28/2021, 10:33 AM

## 2021-03-28 NOTE — Progress Notes (Signed)
°  Transition of Care (TOC) Screening Note   Patient Details  Name: Alicia Villegas Date of Birth: Jan 16, 2001   Transition of Care [facility]) CM/SW Contact:    Gahel Safley, Marjie Skiff, RN Phone Number: 03/28/2021, 10:07 AM    Transition of Care Department [facility]) has reviewed patient and no TOC needs have been identified at this time. We will continue to monitor patient advancement through interdisciplinary progression rounds. If new patient transition needs arise, please place a TOC consult.

## 2021-03-28 NOTE — Progress Notes (Signed)
Morphine PCA 1mg /mL wasted into stericycle with Lannie Fields, RN.

## 2021-03-29 ENCOUNTER — Telehealth: Payer: Self-pay

## 2021-03-29 NOTE — Telephone Encounter (Signed)
Transition Care Management Unsuccessful Follow-up Telephone Call  Date of discharge and from where:  03/28/2021-[facility]   Attempts:  1st Attempt  Reason for unsuccessful TCM follow-up call:  Left voice message

## 2021-03-30 NOTE — Telephone Encounter (Signed)
Transition Care Management Unsuccessful Follow-up Telephone Call  Date of discharge and from where:  03/28/2021 from [facility]  Attempts:  2nd Attempt  Reason for unsuccessful TCM follow-up call:  Left voice message

## 2021-03-31 NOTE — Telephone Encounter (Signed)
Transition Care Management Unsuccessful Follow-up Telephone Call  Date of discharge and from where:  03/28/2021-[facility]  Attempts:  3rd Attempt  Reason for unsuccessful TCM follow-up call:  Left voice message

## 2021-04-11 ENCOUNTER — Encounter: Payer: Self-pay | Admitting: Nurse Practitioner

## 2021-04-11 ENCOUNTER — Other Ambulatory Visit: Payer: Self-pay

## 2021-04-11 ENCOUNTER — Ambulatory Visit (INDEPENDENT_AMBULATORY_CARE_PROVIDER_SITE_OTHER): Payer: Medicaid Other | Admitting: Nurse Practitioner

## 2021-04-11 VITALS — BP 108/63 | HR 100 | Temp 98.0°F | Ht 61.0 in | Wt 107.4 lb

## 2021-04-11 DIAGNOSIS — R8290 Unspecified abnormal findings in urine: Secondary | ICD-10-CM

## 2021-04-11 DIAGNOSIS — N898 Other specified noninflammatory disorders of vagina: Secondary | ICD-10-CM | POA: Diagnosis not present

## 2021-04-11 LAB — OB RESULTS CONSOLE GC/CHLAMYDIA: Chlamydia: NEGATIVE

## 2021-04-11 LAB — POCT URINALYSIS DIP (CLINITEK)
Glucose, UA: NEGATIVE mg/dL
Ketones, POC UA: NEGATIVE mg/dL
Nitrite, UA: NEGATIVE
POC PROTEIN,UA: 30 — AB
Spec Grav, UA: 1.02 (ref 1.010–1.025)
Urobilinogen, UA: 2 E.U./dL — AB
pH, UA: 5.5 (ref 5.0–8.0)

## 2021-04-11 MED ORDER — FLUCONAZOLE 150 MG PO TABS: 150.0000 mg | ORAL_TABLET | Freq: Once | ORAL | 1 refills | Status: AC

## 2021-04-11 NOTE — Patient Instructions (Signed)
You were seen today in the [facility] for evaluation for possible yeats infection. Labs were collected, results will be available via MyChart or, if abnormal, you will be contacted by clinic staff. You were prescribed medications, please take as directed. Please follow up as needed ?

## 2021-04-11 NOTE — Progress Notes (Signed)
[facility] Patient [facility] [address] Anastasia Pall [place], [place]  [address] Phone:  [phone]   Fax:  [phone] Subjective:   Patient ID: Alicia Villegas, female    DOB: [birth year], 21 y.o.   MRN: [id number]  Chief Complaint  Patient presents with   Follow-up    Pt is here for vaginal itching, yellow discharge pt has been feeling like this for a week   HPI Alicia Villegas 21 y.o. 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 To the [facility] for possible yeats infection.  Patient states that she has had vaginal discharge, yellowish- green x 3-4 days. Endorses some itching and burning. Denies any burning with urination. Has had one partner in the past 6 mths, protected intercourse, heterosexual preference. Denies any pelvic and/ low back pain. Symptoms mimic past episodes of yeast infection. Has been using vaginal cream with only temporary relief in itching. Denies any other complaints today.  Denies any fever. Denies any fatigue, chest pain, shortness of breath, HA or dizziness. Denies any blurred vision, numbness or tingling.   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   Sickle cell anemia ([name])    Sickle cell anemia ([name])    type SS    Past Surgical History:  Procedure Laterality Date   CHOLECYSTECTOMY     DG GALL BLADDER     SPLENECTOMY     SPLENECTOMY, TOTAL      Family History  Family history unknown: Yes    Social History   Socioeconomic History   Marital status: Single    Spouse name: Not on file   Number of children: Not on file   Years of education: Not on file   Highest education level: Not on file  Occupational History   Not on file   Tobacco Use   Smoking status: Never   Smokeless tobacco: Never  Vaping Use   Vaping Use: Never used  Substance and Sexual Activity   Alcohol use: No   Drug use: No   Sexual activity: Never    Birth control/protection: Abstinence  Other Topics Concern   Not on file  Social History Narrative   ** Merged History Encounter **       ** Data from: 01/02/16 Enc Dept: [facility]   Family moved from [place] to [place] around 2008 (age 71).  Lives with parents & siblings age 70, 29, 24, 82, 104, 2.  No smokers in the house; No pets in the house.       ** Data from: 03/16/16 Enc Dept: [facility]   From [place], many siblings at home.   Social Determinants of Health   Financial Resource Strain: Not on file  Food Insecurity: Not on file  Transportation Needs: Not on file  Physical Activity: Not on file  Stress: Not on file  Social Connections: Not on file  Intimate Partner Violence: Not on file    Outpatient Medications Prior to Visit  Medication Sig Dispense Refill   acetaminophen (TYLENOL) 325 MG tablet Take 650 mg by mouth every 6 (six) hours as needed for moderate pain or mild pain.     hydroxyurea (HYDREA) 100 mg/mL SUSP Take 1,200 mg by  mouth daily.     ibuprofen (ADVIL) 200 MG tablet Take 400 mg by mouth every 6 (six) hours as needed for moderate pain or mild pain.     oxyCODONE-acetaminophen (PERCOCET) 5-325 MG tablet Take 1 tablet by mouth every 4 (four) hours as needed for severe pain. 30 tablet 0   voxelotor (OXBRYTA) 500 MG TABS tablet Take 1,500 mg by mouth daily.     propranolol (INDERAL) 20 MG tablet Take 20 mg by mouth daily.     Water For Irrigation, Sterile (STERILE WATER FOR IRRIGATION)      No facility-administered medications prior to visit.    No Known Allergies  Review of Systems  Constitutional:  Negative for chills, fever and malaise/fatigue.  Respiratory:  Negative for cough and shortness of breath.   Cardiovascular:  Negative for chest  pain, palpitations and leg swelling.  Gastrointestinal:  Negative for abdominal pain, blood in stool, constipation, diarrhea, nausea and vomiting.  Genitourinary:  Negative for dysuria, flank pain, frequency, hematuria and urgency.       See HPI  Skin: Negative.   Neurological: Negative.   Psychiatric/Behavioral:  Negative for depression. The patient is not nervous/anxious.   All other systems reviewed and are negative.     Objective:    Physical Exam Vitals reviewed.  Constitutional:      General: She is not in acute distress.    Appearance: Normal appearance.  HENT:     Head: Normocephalic.  Cardiovascular:     Rate and Rhythm: Normal rate and regular rhythm.     Pulses: Normal pulses.     Heart sounds: Normal heart sounds.     Comments: No obvious peripheral edema Pulmonary:     Effort: Pulmonary effort is normal.     Breath sounds: Normal breath sounds.  Genitourinary:    Comments: Exam deferred  Skin:    General: Skin is warm and dry.     Capillary Refill: Capillary refill takes less than 2 seconds.  Neurological:     General: No focal deficit present.     Mental Status: She is alert and oriented to person, place, and time.  Psychiatric:        Mood and Affect: Mood normal.        Behavior: Behavior normal.        Thought Content: Thought content normal.        Judgment: Judgment normal.    BP 108/63    Pulse 100    Temp 98 F (36.7 C)    Ht $R'5\' 1"'Ew$  (1.549 m)    Wt 107 lb 6 oz (48.7 kg)    SpO2 99%    BMI 20.29 kg/m  Wt Readings from Last 3 Encounters:  04/11/21 107 lb 6 oz (48.7 kg) (12 %, Z= -1.19)*  03/24/21 107 lb 9.4 oz (48.8 kg) (12 %, Z= -1.17)*  03/10/21 107 lb 12.8 oz (48.9 kg) (13 %, Z= -1.15)*   * Growth percentiles are based on CDC (Girls, 2-20 Years) data.    Immunization History  Administered Date(s) Administered   HPV 9-valent 12/01/2014, 10/09/2016   Hepatitis A 02/14/2011, 04/15/2012   Hepatitis A, Ped/Adol-2 Dose 01/17/2009, 09/05/2009,  02/14/2011, 04/15/2012   Hepatitis B 02/14/2011, 06/21/2011, 04/15/2012   Hepatitis B, ped/adol 01/17/2009, 02/25/2009, 09/05/2009, 02/14/2011, 06/21/2011, 04/15/2012   IPV 01/17/2009, 02/25/2009, 04/25/2009, 02/14/2011, 06/21/2011, 04/15/2012   Influenza Split 01/17/2009, 02/25/2009, 12/26/2009, 12/02/2012   Influenza, Seasonal, Injecte, Preservative Fre 12/01/2014   Influenza,inj,Quad PF,6+ Mos 12/01/2014, 11/18/2015, 01/19/2017,  01/09/2018, 11/08/2020   Influenza,inj,quad, With Preservative 11/12/2013   Influenza-Unspecified 02/14/2011, 04/15/2012, 11/12/2013   MMR 01/17/2009, 02/25/2009, 02/14/2011, 06/21/2011   Meningococcal Conjugate 09/06/2009, 01/17/2010, 12/01/2014, 09/06/2020   Pneumococcal Conjugate-13 04/27/2009, 05/20/2009, 08/24/2013   Pneumococcal Polysaccharide-23 01/17/2009, 06/17/2014   Td 02/14/2011, 06/21/2011, 04/15/2012   Tdap 01/17/2009, 02/25/2009, 01/17/2010, 01/17/2010, 02/14/2011, 06/21/2011, 04/15/2012   Varicella 01/17/2009, 02/25/2009, 02/14/2011, 06/21/2011    Diabetic Foot Exam - Simple   No data filed     No results found for: TSH Lab Results  Component Value Date   WBC 15.9 (H) 03/24/2021   HGB 7.0 (L) 03/24/2021   HCT 19.9 (L) 03/24/2021   MCV 88.4 03/24/2021   PLT 598 (H) 03/24/2021   Lab Results  Component Value Date   NA 134 (L) 03/24/2021   K 3.3 (L) 03/24/2021   CO2 21 (L) 03/24/2021   GLUCOSE 106 (H) 03/24/2021   BUN 5 (L) 03/24/2021   CREATININE 0.34 (L) 03/24/2021   BILITOT 2.0 (H) 03/24/2021   ALKPHOS 85 03/24/2021   AST 44 (H) 03/24/2021   ALT 16 03/24/2021   PROT 8.4 (H) 03/24/2021   ALBUMIN 4.5 03/24/2021   CALCIUM 9.2 03/24/2021   ANIONGAP 8 03/24/2021   EGFR 144 11/08/2020   No results found for: CHOL No results found for: HDL No results found for: LDLCALC No results found for: TRIG No results found for: CHOLHDL No results found for: HGBA1C     Assessment & Plan:   Problem List Items Addressed This Visit        Musculoskeletal and Integument   Vaginal itching - Primary   Relevant Orders   NuSwab Vaginitis Plus (VG+)   POCT URINALYSIS DIP (CLINITEK) (Completed)   Other Visit Diagnoses     Vaginal discharge       Relevant Orders   NuSwab Vaginitis Plus (VG+)   POCT URINALYSIS DIP (CLINITEK) (Completed) Informed to take OTC medications as needed for symptoms   Abnormal finding on urinalysis       Relevant Orders   Urine Culture   Return to clinic in 1-2 wks as needed if symptoms worsen or do not improve     I am having Rosenda Landrigan "[name]" start on fluconazole. I am also having her maintain her hydroxyurea, voxelotor, ibuprofen, acetaminophen, oxyCODONE-acetaminophen, propranolol, and sterile water for irrigation.  Meds ordered this encounter  Medications   fluconazole (DIFLUCAN) 150 MG tablet    Sig: Take 1 tablet (150 mg total) by mouth once for 1 dose.    Dispense:  1 tablet    Refill:  1     Teena Dunk, NP

## 2021-04-12 DIAGNOSIS — R8290 Unspecified abnormal findings in urine: Secondary | ICD-10-CM | POA: Diagnosis not present

## 2021-04-14 LAB — NUSWAB VAGINITIS PLUS (VG+)
Candida albicans, NAA: POSITIVE — AB
Candida glabrata, NAA: NEGATIVE
Chlamydia trachomatis, NAA: NEGATIVE
Neisseria gonorrhoeae, NAA: NEGATIVE
Trich vag by NAA: NEGATIVE

## 2021-04-14 LAB — URINE CULTURE

## 2021-04-20 DIAGNOSIS — D571 Sickle-cell disease without crisis: Secondary | ICD-10-CM | POA: Diagnosis not present

## 2021-04-20 DIAGNOSIS — G43019 Migraine without aura, intractable, without status migrainosus: Secondary | ICD-10-CM | POA: Diagnosis not present

## 2021-04-26 ENCOUNTER — Telehealth: Payer: Self-pay | Admitting: Family Medicine

## 2021-04-26 ENCOUNTER — Other Ambulatory Visit: Payer: Self-pay | Admitting: Family Medicine

## 2021-04-26 NOTE — Telephone Encounter (Signed)
The RN at [facility] called with concerns about this patient. EMS was onsite at the time of the call due to the patient having bad nose bleeds, but patient refused to go to ED as recommended.  ?Patient requests to speak to provider about her health concerns: nose bleeds, fatigue, weakness, lack of mental clarity.  ?RN concerned with patient having 2 significant nose bleeds within 24 hours with clots in sink. ? ?Requesting callback to patient by provider ASAP. ?RN =Debra Amedeo Plenty [phone] ? ?

## 2021-04-26 NOTE — Telephone Encounter (Signed)
Alicia Villegas is a 21 year old female with a medical history significant for sickle cell disease.  I was notified by patient's school nurse from [employer] who states that patient has had 2 episodes of epistaxis, bright red with clotting.  She also states that patient is fatigued and has altered mental status.  At that time, EMS was contacted and patient's vital signs were stable.  Patient refused to go with EMS, however; her condition is worsening. ? ?Contacted [facility] sickle-cell agency case manager, [name] who will contact patient's health advocate.  Advised to call EMS and get patient to [facility] immediately for further work-up and evaluation.  Patient may be having acute vaso-occlusive crises, and that will definitely require further attention. ? ? ?Donia Pounds  APRN, MSN, FNP-C ?Patient Alicia Villegas ?[facility] ?[address]  ?[place], [place] [address] ?[phone] ? ?

## 2021-04-27 ENCOUNTER — Other Ambulatory Visit: Payer: Self-pay

## 2021-04-27 ENCOUNTER — Observation Stay (HOSPITAL_COMMUNITY)
Admission: AD | Admit: 2021-04-27 | Discharge: 2021-04-28 | Disposition: A | Discharge status: Home / Self Care | Payer: Medicaid Other | Source: Ambulatory Visit | Attending: Internal Medicine | Admitting: Internal Medicine

## 2021-04-27 ENCOUNTER — Encounter (HOSPITAL_COMMUNITY): Payer: Self-pay | Admitting: Family Medicine

## 2021-04-27 DIAGNOSIS — D571 Sickle-cell disease without crisis: Principal | ICD-10-CM

## 2021-04-27 DIAGNOSIS — D649 Anemia, unspecified: Secondary | ICD-10-CM | POA: Diagnosis present

## 2021-04-27 DIAGNOSIS — R0602 Shortness of breath: Secondary | ICD-10-CM | POA: Diagnosis present

## 2021-04-27 DIAGNOSIS — Z859 Personal history of malignant neoplasm, unspecified: Secondary | ICD-10-CM | POA: Insufficient documentation

## 2021-04-27 DIAGNOSIS — Z20822 Contact with and (suspected) exposure to covid-19: Secondary | ICD-10-CM | POA: Diagnosis not present

## 2021-04-27 DIAGNOSIS — D638 Anemia in other chronic diseases classified elsewhere: Secondary | ICD-10-CM | POA: Diagnosis present

## 2021-04-27 LAB — RETICULOCYTES
Immature Retic Fract: 25.8 % — ABNORMAL HIGH (ref 2.3–15.9)
RBC.: 2.05 MIL/uL — ABNORMAL LOW (ref 3.87–5.11)
Retic Count, Absolute: 218 10*3/uL — ABNORMAL HIGH (ref 19.0–186.0)
Retic Ct Pct: 10.6 % — ABNORMAL HIGH (ref 0.4–3.1)

## 2021-04-27 LAB — COMPREHENSIVE METABOLIC PANEL
ALT: 11 U/L (ref 0–44)
AST: 28 U/L (ref 15–41)
Albumin: 4.7 g/dL (ref 3.5–5.0)
Alkaline Phosphatase: 95 U/L (ref 38–126)
Anion gap: 8 (ref 5–15)
BUN: 6 mg/dL (ref 6–20)
CO2: 19 mmol/L — ABNORMAL LOW (ref 22–32)
Calcium: 9 mg/dL (ref 8.9–10.3)
Chloride: 108 mmol/L (ref 98–111)
Creatinine, Ser: 0.33 mg/dL — ABNORMAL LOW (ref 0.44–1.00)
GFR, Estimated: >60 mL/min (ref >60)
Glucose, Bld: 95 mg/dL (ref 70–99)
Potassium: 3.5 mmol/L (ref 3.5–5.1)
Sodium: 135 mmol/L (ref 135–145)
Total Bilirubin: 2.5 mg/dL — ABNORMAL HIGH (ref 0.3–1.2)
Total Protein: 8.5 g/dL — ABNORMAL HIGH (ref 6.5–8.1)

## 2021-04-27 LAB — CBC WITH DIFFERENTIAL/PLATELET
Abs Immature Granulocytes: 0.05 10*3/uL (ref 0.00–0.07)
Basophils Absolute: 0 10*3/uL (ref 0.0–0.1)
Basophils Relative: 0 %
Eosinophils Absolute: 0.1 10*3/uL (ref 0.0–0.5)
Eosinophils Relative: 1 %
HCT: 19.2 % — ABNORMAL LOW (ref 36.0–46.0)
Hemoglobin: 6.9 g/dL — CL (ref 12.0–15.0)
Immature Granulocytes: 1 %
Lymphocytes Relative: 35 %
Lymphs Abs: 3.3 10*3/uL (ref 0.7–4.0)
MCH: 33.5 pg (ref 26.0–34.0)
MCHC: 35.9 g/dL (ref 30.0–36.0)
MCV: 93.2 fL (ref 80.0–100.0)
Monocytes Absolute: 1.1 10*3/uL — ABNORMAL HIGH (ref 0.1–1.0)
Monocytes Relative: 12 %
Neutro Abs: 5 10*3/uL (ref 1.7–7.7)
Neutrophils Relative %: 51 %
Platelets: 300 10*3/uL (ref 150–400)
RBC: 2.06 MIL/uL — ABNORMAL LOW (ref 3.87–5.11)
RDW: 23.9 % — ABNORMAL HIGH (ref 11.5–15.5)
WBC: 9.6 10*3/uL (ref 4.0–10.5)
nRBC: 0.7 % — ABNORMAL HIGH (ref 0.0–0.2)

## 2021-04-27 LAB — RESP PANEL BY RT-PCR (FLU A&B, COVID) ARPGX2
Influenza A by PCR: NEGATIVE
Influenza B by PCR: NEGATIVE
SARS Coronavirus 2 by RT PCR: NEGATIVE

## 2021-04-27 LAB — PREPARE RBC (CROSSMATCH)

## 2021-04-27 LAB — LACTATE DEHYDROGENASE: LDH: 426 U/L — ABNORMAL HIGH (ref 98–192)

## 2021-04-27 MED ORDER — ACETAMINOPHEN 325 MG PO TABS
650.0000 mg | ORAL_TABLET | Freq: Once | ORAL | Status: AC
  Administered 2021-04-27: 650 mg via ORAL
  Filled 2021-04-27: qty 2

## 2021-04-27 MED ORDER — DIPHENHYDRAMINE HCL 25 MG PO CAPS
25.0000 mg | ORAL_CAPSULE | Freq: Once | ORAL | Status: AC
  Administered 2021-04-27: 25 mg via ORAL
  Filled 2021-04-27: qty 1

## 2021-04-27 MED ORDER — OXYCODONE-ACETAMINOPHEN 5-325 MG PO TABS: 1.0000 | ORAL_TABLET | ORAL | Status: DC | PRN

## 2021-04-27 MED ORDER — SODIUM CHLORIDE 0.9% IV SOLUTION: Freq: Once | INTRAVENOUS | Status: AC

## 2021-04-27 MED ORDER — ACETAMINOPHEN 325 MG PO TABS: 650.0000 mg | ORAL_TABLET | Freq: Four times a day (QID) | ORAL | Status: DC | PRN

## 2021-04-27 MED ORDER — KETOROLAC TROMETHAMINE 15 MG/ML IJ SOLN: 15.0000 mg | Freq: Four times a day (QID) | INTRAMUSCULAR | Status: DC | PRN

## 2021-04-27 MED ORDER — SODIUM CHLORIDE 0.45 % IV SOLN: INTRAVENOUS | Status: DC

## 2021-04-27 MED ORDER — POLYETHYLENE GLYCOL 3350 17 G PO PACK: 17.0000 g | PACK | Freq: Every day | ORAL | Status: DC | PRN

## 2021-04-27 MED ORDER — SENNOSIDES-DOCUSATE SODIUM 8.6-50 MG PO TABS
1.0000 | ORAL_TABLET | Freq: Two times a day (BID) | ORAL | Status: DC
Start: 2021-04-27 — End: 2021-04-28
  Filled 2021-04-27 (×2): qty 1

## 2021-04-27 MED ORDER — HYDROXYUREA 100 MG/ML ORAL SUSPENSION: 1200.0000 mg | Freq: Every day | ORAL | Status: DC

## 2021-04-27 MED ORDER — PROPRANOLOL HCL 20 MG PO TABS
20.0000 mg | ORAL_TABLET | Freq: Every day | ORAL | Status: DC
  Filled 2021-04-27: qty 1

## 2021-04-27 MED ORDER — VOXELOTOR 500 MG PO TABS: 1500.0000 mg | ORAL_TABLET | Freq: Every day | ORAL | Status: DC

## 2021-04-27 NOTE — Progress Notes (Signed)
Patient admitted to the day infusion hospital for fatigue and weakness. Yesterday, patient had multiple nose bleeds with weakness which prompted EMS to be dispatched. Patient refused to go to ED with EMS. [place] Hollis, FNP advised that patient come to the day hospital this morning for admission and transfer to inpatient unit. Upon arrival to the day hospital, patient reported fatigue but reported no pain. IV team started patient's IV using ultra sound. Patient hydrated with IV fluids. Labs drawn (CBC w/diff, Retic, CMP, LDH and Type & Screen). Patient's hemoglobin 6.9. [place], [place] assessed patient. Orders placed for admission to inpatient unit for blood transfusion and continued monitoring. Report called to [place], RN on [address]. Patient transferred to [facility] in wheelchair. Patient alert, oriented and stable at transfer.    ?

## 2021-04-27 NOTE — H&P (Signed)
?H&P ? Patient Demographics:  ?Alicia Villegas, is a 21 y.o. female  MRN: [id number]   DOB - 09/02/2000 ? ?Admit Date - 04/27/2021 ? ?Outpatient Primary MD for the patient is Dorena Dew, FNP ? ? ?  ? ? HPI:  ? ?Alicia Villegas  is a 21 y.o. female with a medical history significant for sickle cell disease and symptomatic anemia that presented to sickle cell day infusion clinic with complaints of increasing fatigue and shortness of breath over the past several days.  Patient also states that she had 2 episodes of epistaxis.  She says that she has been unable to get out of bed due to increased fatigue.  She says that she has had this problem before with decreased hemoglobin related to her sickle cell disease.  Patient's baseline hemoglobin is 7-8 g/dL.  She is not having any pain at this time.  She denies any fever, chills, chest pain, dizziness, or headache.  No nausea, vomiting, diarrhea, or urinary symptoms.  No sick contacts, recent travel, or known exposure to COVID-19. ? ?Sickle cell day infusion center course: ?Patient admitted to sickle cell day infusion clinic.  Vital signs remained stable.  Hemoglobin found to be 6.9 g/dL.  Patient admitted to [facility] and observation for symptomatic anemia in the setting of sickle cell disease. ? ? Review of systems:  ?In addition to the HPI above, patient reports ?No fever or chills ?No Headache, No changes with vision or hearing ?No problems swallowing food or liquids ?No chest pain, cough or shortness of breath ?No abdominal pain, No nausea or vomiting, Bowel movements are regular ?No blood in stool or urine ?No dysuria ?No new skin rashes or bruises ?No new joints pains-aches ?No new weakness, tingling, numbness in any extremity ?No recent weight gain or loss ?No polyuria, polydypsia or polyphagia ?No significant Mental Stressors ? ? ?With Past History of the following :  ? ?Past Medical History:  ?Diagnosis Date  ? Cancer [facility])   ? Hepatosplenomegaly 02/05/2006  ?  associated with chronic malaria  ? Malaria 02/05/2006  ? 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 Sickle cell anemia ([name])   ? Sickle cell anemia ([name])   ? type SS  ?   ? ?Past Surgical History:  ?Procedure Laterality Date  ? CHOLECYSTECTOMY    ? DG GALL BLADDER    ? SPLENECTOMY    ? SPLENECTOMY, TOTAL    ? ? ? Social History:  ? ?Social History  ? ?Tobacco Use  ? Smoking status: Never  ? Smokeless tobacco: Never  ?Substance Use Topics  ? Alcohol use: No  ?  ? ?Lives - At home ? ? Family History :  ? ?Family History  ?Family history unknown: Yes  ? ? ? Home Medications:  ? ?Prior to Admission medications   ?Medication Sig Start Date End Date Taking? Authorizing Provider  ?acetaminophen (TYLENOL) 325 MG tablet Take 650 mg by mouth every 6 (six) hours as needed for moderate pain or mild pain.    [provider]  ?hydroxyurea (HYDREA) 100 mg/mL SUSP Take 1,200 mg by mouth daily.    [provider]  ?ibuprofen (ADVIL) 200 MG tablet Take 400 mg by mouth every 6 (six) hours as needed for moderate pain or mild pain.    [provider]  ?oxyCODONE-acetaminophen (PERCOCET) 5-325 MG tablet Take 1 tablet by mouth every 4 (four) hours as needed for severe pain. 03/23/21 03/23/22  Cammie Sickle  M, FNP  ?propranolol (INDERAL) 20 MG tablet Take 20 mg by mouth daily. 04/02/21   [provider]  ?voxelotor (OXBRYTA) 500 MG TABS tablet Take 1,500 mg by mouth daily. 10/19/20   [provider]  ?Water For Irrigation, Sterile (STERILE WATER FOR IRRIGATION)  04/03/21   [provider]  ? ? ? Allergies:  ? ?No Known Allergies ? ? Physical Exam:  ? ?Vitals:  ? ?Vitals:  ? 04/27/21 1011  ?BP: (!) 96/53  ?Pulse: 94  ?Resp: 16  ?Temp: 97.7 ?F (36.5 ?C)  ?SpO2: 99%  ? ? ?Physical Exam: ?Constitutional: Patient appears well-developed and well-nourished. Not in obvious distress. ?HENT: Normocephalic, atraumatic, External right and left ear normal. Oropharynx is  clear and moist.  ?Eyes: Conjunctivae and EOM are normal. PERRLA, no scleral icterus. ?Neck: Normal ROM. Neck supple. No JVD. No tracheal deviation. No thyromegaly. ?CVS: RRR, S1/S2 +, no murmurs, no gallops, no carotid bruit.  ?Pulmonary: Effort and breath sounds normal, no stridor, rhonchi, wheezes, rales.  ?Abdominal: Soft. BS +, no distension, tenderness, rebound or guarding.  ?Musculoskeletal: Normal range of motion. No edema and no tenderness.  ?Lymphadenopathy: No lymphadenopathy noted, cervical, inguinal or axillary ?Neuro: Alert. Normal reflexes, muscle tone coordination. No cranial nerve deficit. ?Skin: Skin is warm and dry. No rash noted. Not diaphoretic. No erythema. No pallor. ?Psychiatric: Normal mood and affect. Behavior, judgment, thought content normal. ? ? Data Review:  ? ?CBC ?Recent Labs  ?Lab 04/27/21 ?1035  ?WBC 9.6  ?HGB 6.9*  ?HCT 19.2*  ?PLT 300  ?MCV 93.2  ?MCH 33.5  ?MCHC 35.9  ?RDW 23.9*  ?LYMPHSABS 3.3  ?MONOABS 1.1*  ?EOSABS 0.1  ?BASOSABS 0.0  ? ?------------------------------------------------------------------------------------------------------------------ ? ?Chemistries  ?Recent Labs  ?Lab 04/27/21 ?1035  ?NA 135  ?K 3.5  ?CL 108  ?CO2 19*  ?GLUCOSE 95  ?BUN 6  ?CREATININE 0.33*  ?CALCIUM 9.0  ?AST 28  ?ALT 11  ?ALKPHOS 95  ?BILITOT 2.5*  ? ?------------------------------------------------------------------------------------------------------------------ ?CrCl cannot be calculated (Unknown ideal weight.). ?------------------------------------------------------------------------------------------------------------------ ?No results for input(s): TSH, T4TOTAL, T3FREE, THYROIDAB in the last 72 hours. ? ?Invalid input(s): FREET3 ? ?Coagulation profile ?No results for input(s): INR, PROTIME in the last 168 hours. ?------------------------------------------------------------------------------------------------------------------- ?No results for input(s): DDIMER in the last 72  hours. ?------------------------------------------------------------------------------------------------------------------- ? ?Cardiac Enzymes ?No results for input(s): CKMB, TROPONINI, MYOGLOBIN in the last 168 hours. ? ?Invalid input(s): CK ?------------------------------------------------------------------------------------------------------------------ ?No results found for: BNP ? ?--------------------------------------------------------------------------------------------------------------- ? ?Urinalysis ?   ?Component Value Date/Time  ? COLORURINE AMBER (A) 03/09/2021 0000  ? APPEARANCEUR HAZY (A) 03/09/2021 0000  ? LABSPEC 1.020 03/09/2021 0000  ? PHURINE 6.0 03/09/2021 0000  ? GLUCOSEU NEGATIVE 03/09/2021 0000  ? HGBUR LARGE (A) 03/09/2021 0000  ? BILIRUBINUR small (A) 04/11/2021 1241  ? BILIRUBINUR 2+ 05/09/2020 1537  ? KETONESUR negative 04/11/2021 1241  ? [place] NEGATIVE 03/09/2021 0000  ? PROTEINUR NEGATIVE 03/09/2021 0000  ? UROBILINOGEN 2.0 (A) 04/11/2021 1241  ? UROBILINOGEN 1.0 01/13/2014 1612  ? NITRITE Negative 04/11/2021 1241  ? NITRITE NEGATIVE 03/09/2021 0000  ? LEUKOCYTESUR Large (3+) (A) 04/11/2021 1241  ? LEUKOCYTESUR SMALL (A) 03/09/2021 0000  ? ? ?---------------------------------------------------------------------------------------------------------------- ? ? Imaging Results:  ? ? No results found. ? ? Assessment & Plan:  ?Principal Problem: ?  Symptomatic anemia ?Active Problems: ?  Sickle cell disease ([place]) ?  Anemia of chronic disease ?  Shortness of breath on exertion ? ?Symptomatic anemia: ?Admit to MedSurg in observation.  Current hemoglobin 6.9 g/dL.  Patient  endorses fatigue and some shortness of breath with exertion.  Transfuse 1 unit PRBCs. ?Monitor vital signs closely, and supplemental oxygen as needed ? ?Sickle cell disease: ?Patient denies any sickle cell related pain.  We will continue patient's home maintenance medication including hydroxyurea and Oxbryta.  We will  start folic acid.  Labs in AM. ? ?Shortness of breath with exertion: ?Secondary to anemia.  Supplemental oxygen as needed. ? ?DVT Prophylaxis: Subcut Lovenox  ? ?AM Labs Ordered, also please review Full Orders ? ?Family

## 2021-04-27 NOTE — Progress Notes (Signed)
Critical Value ? ? ?Test result: Hemoglobin 6.9 ? ?Time and Date notified: 04/27/21 at 1215 pm ? ?Provider notified: Hollis, [place], [place] ? ?Action taken:  Patient to be admitted for blood transfusion  ?

## 2021-04-28 ENCOUNTER — Encounter: Payer: Self-pay | Admitting: Family Medicine

## 2021-04-28 DIAGNOSIS — D649 Anemia, unspecified: Secondary | ICD-10-CM | POA: Diagnosis not present

## 2021-04-28 DIAGNOSIS — D571 Sickle-cell disease without crisis: Secondary | ICD-10-CM | POA: Diagnosis not present

## 2021-04-28 LAB — CBC
HCT: 23.9 % — ABNORMAL LOW (ref 36.0–46.0)
Hemoglobin: 8.8 g/dL — ABNORMAL LOW (ref 12.0–15.0)
MCH: 32.8 pg (ref 26.0–34.0)
MCHC: 36.8 g/dL — ABNORMAL HIGH (ref 30.0–36.0)
MCV: 89.2 fL (ref 80.0–100.0)
Platelets: 327 10*3/uL (ref 150–400)
RBC: 2.68 MIL/uL — ABNORMAL LOW (ref 3.87–5.11)
RDW: 20.9 % — ABNORMAL HIGH (ref 11.5–15.5)
WBC: 7.6 10*3/uL (ref 4.0–10.5)
nRBC: 0.4 % — ABNORMAL HIGH (ref 0.0–0.2)

## 2021-04-28 LAB — BASIC METABOLIC PANEL
Anion gap: 7 (ref 5–15)
BUN: 7 mg/dL (ref 6–20)
CO2: 18 mmol/L — ABNORMAL LOW (ref 22–32)
Calcium: 8.8 mg/dL — ABNORMAL LOW (ref 8.9–10.3)
Chloride: 111 mmol/L (ref 98–111)
Creatinine, Ser: 0.32 mg/dL — ABNORMAL LOW (ref 0.44–1.00)
GFR, Estimated: >60 mL/min (ref >60)
Glucose, Bld: 90 mg/dL (ref 70–99)
Potassium: 4.3 mmol/L (ref 3.5–5.1)
Sodium: 136 mmol/L (ref 135–145)

## 2021-04-28 LAB — TYPE AND SCREEN
ABO/RH(D): AB POS
Antibody Screen: POSITIVE
Unit division: 0

## 2021-04-28 LAB — BPAM RBC
Blood Product Expiration Date: 202304292359
ISSUE DATE / TIME: 202303232335
Unit Type and Rh: 5100

## 2021-04-28 MED ORDER — PROPRANOLOL HCL 20 MG PO TABS: 20.0000 mg | ORAL_TABLET | Freq: Every day | ORAL | 0 refills | Status: DC

## 2021-04-28 NOTE — Telephone Encounter (Signed)
Opened in error

## 2021-05-01 ENCOUNTER — Telehealth: Payer: Self-pay

## 2021-05-01 NOTE — Telephone Encounter (Signed)
Transition Care Management Unsuccessful Follow-up Telephone Call ? ?Date of discharge and from where:  04/28/2021 from [facility] ? ?Attempts:  1st Attempt ? ?Reason for unsuccessful TCM follow-up call:  Left voice message ? ? ? ?

## 2021-05-01 NOTE — Discharge Summary (Signed)
Physician Discharge Summary  ?Alicia Villegas EHU:[id number] DOB: [birth year] DOA: 04/27/2021 ? ?PCP: Dorena Dew, FNP ? ?Admit date: 04/27/2021 ? ?Discharge date: 04/28/2021 ? ?Discharge Diagnoses:  ?Principal Problem: ?  Symptomatic anemia ?Active Problems: ?  Sickle cell disease ([place]) ?  Anemia of chronic disease ?  Shortness of breath on exertion ? ? ?Discharge Condition: Stable ? ?Disposition:  ? Follow-up Information   ? ? Dorena Dew, FNP Follow up in 1 week(s).   ?Specialty: Family Medicine ?Contact information: ?[address] ?[address] ?[place] [address] ?[phone] ? ? ?  ?  ? ?  ?  ? ?  ? ?Pt is discharged home in good condition and is to follow up with Dorena Dew, FNP this week to have labs evaluated. Alicia Villegas is instructed to increase activity slowly and balance with rest for the next few days, and use prescribed medication to complete treatment of pain ? ?Diet: Regular ?Wt Readings from Last 3 Encounters:  ?04/27/21 50.3 kg (18 %, Z= -0.93)*  ?04/11/21 48.7 kg (12 %, Z= -1.19)*  ?03/24/21 48.8 kg (12 %, Z= -1.17)*  ? ?* Growth percentiles are based on CDC (Girls, 2-20 Years) data.  ? ? ?History of present illness:  ?Alicia Villegas  is a 21 y.o. female with a medical history significant for sickle cell disease and symptomatic anemia that presented to sickle cell day infusion clinic with complaints of increasing fatigue and shortness of breath over the past several days.  Patient also states that she had 2 episodes of epistaxis.  She says that she has been unable to get out of bed due to increased fatigue.  She says that she has had this problem before with decreased hemoglobin related to her sickle cell disease.  Patient's baseline hemoglobin is 7-8 g/dL.  She is not having any pain at this time.  She denies any fever, chills, chest pain, dizziness, or headache.  No nausea, vomiting, diarrhea, or urinary symptoms.  No sick contacts, recent travel, or known exposure to  COVID-19. ? ?Sickle cell day infusion center course: ?Patient admitted to sickle cell day infusion clinic.  Vital signs remained stable.  Hemoglobin found to be 6.9 g/dL.  Patient admitted to [facility] and observation for symptomatic anemia in the setting of sickle cell disease. ? ?Hospital Course:  ?Symptomatic anemia:  ?Patient admitted to sickle cell day clinic for worsening fatigue.She was found to have a hemoglobin on 6.9 g/d, which is below patient's baseline of 8-9 g/dL.  Prior to admission, patient reports 2 episodes of unprovoked epistaxis. ?Patient received 1 unit PRBCs without any complications.  Patient's hemoglobin returned to baseline at 8.8 g/dL prior to discharge.  Patient states that symptoms have improved, she is not having any fatigue, shortness of breath, dizziness, and patient has not had any additional episodes of epistaxis. ? ?Sickle cell disease: ?Patient has a history of sickle cell disease.  She was advised to resume home Oxbryta and hydroxyurea.  Patient is to follow-up with PCP and hematology team as scheduled. ? ?Patient was therefore discharged home today in a hemodynamically stable condition.  ? ?Alicia Villegas will follow-up with PCP within 1 week of this discharge. Alicia Villegas was counseled extensively about nonpharmacologic means of pain management, patient verbalized understanding and was appreciative of  the care received during this admission.  ? ?We discussed the need for good hydration, monitoring of hydration status, avoidance of heat, cold, stress, and infection triggers. We discussed the need to be adherent with  taking Hydrea and other home medications. Patient was reminded of the need to seek medical attention immediately if any symptom of bleeding, anemia, or infection occurs. ? ?Discharge Exam: ?Vitals:  ? 04/28/21 0155 04/28/21 0422  ?BP: (!) 92/54 (!) 100/55  ?Pulse: 74 82  ?Resp: 17 14  ?Temp: 98.7 ?F (37.1 ?C) 98.1 ?F (36.7 ?C)  ?SpO2: 97% 95%  ? ?Vitals:  ? 04/27/21 2332 04/28/21  0000 04/28/21 0155 04/28/21 0422  ?BP: (!) 107/58 (!) 103/59 (!) 92/54 (!) 100/55  ?Pulse: 98 87 74 82  ?Resp: '18 18 17 14  '$ ?Temp: 98.6 ?F (37 ?C) 98.4 ?F (36.9 ?C) 98.7 ?F (37.1 ?C) 98.1 ?F (36.7 ?C)  ?TempSrc: Oral Oral Oral Oral  ?SpO2: 95% 96% 97% 95%  ?Weight:      ?Height:      ? ? ?General appearance : Awake, alert, not in any distress. Speech Clear. Not toxic looking ?HEENT: Atraumatic and Normocephalic, pupils equally reactive to light and accomodation ?Neck: Supple, no JVD. No cervical lymphadenopathy.  ?Chest: Good air entry bilaterally, no added sounds  ?CVS: S1 S2 regular, no murmurs.  ?Abdomen: Bowel sounds present, Non tender and not distended with no gaurding, rigidity or rebound. ?Extremities: B/L Lower Ext shows no edema, both legs are warm to touch ?Neurology: Awake alert, and oriented X 3, CN II-XII intact, Non focal ?Skin: No Rash ? ?Discharge Instructions ? ?Discharge Instructions   ? ? Discharge patient   Complete by: As directed ?  ? Discharge disposition: 01-Home or Self Care  ? Discharge patient date: 04/28/2021  ? ?  ? ?Allergies as of 04/28/2021   ?No Known Allergies ?  ? ?  ?Medication List  ?  ? ?TAKE these medications   ? ?acetaminophen 325 MG tablet ?Commonly known as: TYLENOL ?Take 650 mg by mouth every 6 (six) hours as needed for moderate pain or mild pain. ?  ?hydroxyurea 100 mg/mL Susp ?Commonly known as: HYDREA ?Take 1,200 mg by mouth daily. ?  ?ibuprofen 200 MG tablet ?Commonly known as: ADVIL ?Take 400 mg by mouth every 6 (six) hours as needed for moderate pain or mild pain. ?  ?oxyCODONE-acetaminophen 5-325 MG tablet ?Commonly known as: Percocet ?Take 1 tablet by mouth every 4 (four) hours as needed for severe pain. ?  ?propranolol 20 MG tablet ?Commonly known as: INDERAL ?Take 1 tablet (20 mg total) by mouth daily. ?  ?sterile water for irrigation ?  ?voxelotor 500 MG Tabs tablet ?Commonly known as: OXBRYTA ?Take 1,500 mg by mouth daily. ?  ? ?  ? ? ?The results of significant  diagnostics from this hospitalization (including imaging, microbiology, ancillary and laboratory) are listed below for reference.   ? ?Significant Diagnostic Studies: ?No results found. ? ?Microbiology: ?Recent Results (from the past 240 hour(s))  ?Resp Panel by RT-PCR (Flu A&B, Covid) Nasopharyngeal Swab     Status: None  ? Collection Time: 04/27/21  4:04 PM  ? Specimen: Nasopharyngeal Swab; Nasopharyngeal(NP) swabs in vial transport medium  ?Result Value Ref Range Status  ? SARS Coronavirus 2 by RT PCR NEGATIVE NEGATIVE Final  ?  Comment: (NOTE) ?SARS-CoV-2 target nucleic acids are NOT DETECTED. ? ?The SARS-CoV-2 RNA is generally detectable in upper respiratory ?specimens during the acute phase of infection. The lowest ?concentration of SARS-CoV-2 viral copies this assay can detect is ?138 copies/mL. A negative result does not preclude SARS-Cov-2 ?infection and should not be used as the sole basis for treatment or ?other patient management decisions. A  negative result may occur with  ?improper specimen collection/handling, submission of specimen other ?than nasopharyngeal swab, presence of viral mutation(s) within the ?areas targeted by this assay, and inadequate number of viral ?copies(<138 copies/mL). A negative result must be combined with ?clinical observations, patient history, and epidemiological ?information. The expected result is Negative. ? ?Fact Sheet for Patients:  ?EntrepreneurPulse.com.au ? ?Fact Sheet for Healthcare Providers:  ?IncredibleEmployment.be ? ?This test is no t yet approved or cleared by the [place] FDA and  ?has been authorized for detection and/or diagnosis of SARS-CoV-2 by ?FDA under an Emergency Use Authorization (EUA). This EUA will remain  ?in effect (meaning this test can be used) for the duration of the ?COVID-19 declaration under Section 564(b)(1) of the Act, 21 ?U.S.C.section 360bbb-3(b)(1), unless the authorization is terminated  ?or  revoked sooner.  ? ? ?  ? Influenza A by PCR NEGATIVE NEGATIVE Final  ? Influenza B by PCR NEGATIVE NEGATIVE Final  ?  Comment: (NOTE) ?The Xpert Xpress SARS-CoV-2/FLU/RSV plus assay is intended as an aid ?in the diagnosi

## 2021-05-02 ENCOUNTER — Encounter (HOSPITAL_COMMUNITY): Payer: Self-pay

## 2021-05-02 ENCOUNTER — Other Ambulatory Visit: Payer: Self-pay

## 2021-05-02 ENCOUNTER — Inpatient Hospital Stay (HOSPITAL_COMMUNITY)
Admission: EM | Admit: 2021-05-02 | Discharge: 2021-05-11 | DRG: 812 | Disposition: A | Discharge status: Home / Self Care | Payer: Medicaid Other | Attending: Internal Medicine | Admitting: Internal Medicine

## 2021-05-02 DIAGNOSIS — D5700 Hb-SS disease with crisis, unspecified: Secondary | ICD-10-CM | POA: Diagnosis not present

## 2021-05-02 DIAGNOSIS — Z20822 Contact with and (suspected) exposure to covid-19: Secondary | ICD-10-CM | POA: Diagnosis present

## 2021-05-02 DIAGNOSIS — R0789 Other chest pain: Secondary | ICD-10-CM | POA: Diagnosis not present

## 2021-05-02 DIAGNOSIS — Z79899 Other long term (current) drug therapy: Secondary | ICD-10-CM

## 2021-05-02 DIAGNOSIS — D5701 Hb-SS disease with acute chest syndrome: Principal | ICD-10-CM

## 2021-05-02 DIAGNOSIS — D72829 Elevated white blood cell count, unspecified: Secondary | ICD-10-CM | POA: Diagnosis present

## 2021-05-02 DIAGNOSIS — R509 Fever, unspecified: Secondary | ICD-10-CM

## 2021-05-02 DIAGNOSIS — G894 Chronic pain syndrome: Secondary | ICD-10-CM | POA: Diagnosis present

## 2021-05-02 DIAGNOSIS — D638 Anemia in other chronic diseases classified elsewhere: Secondary | ICD-10-CM | POA: Diagnosis present

## 2021-05-02 DIAGNOSIS — R0902 Hypoxemia: Secondary | ICD-10-CM | POA: Diagnosis present

## 2021-05-02 DIAGNOSIS — E876 Hypokalemia: Secondary | ICD-10-CM | POA: Diagnosis not present

## 2021-05-02 NOTE — ED Triage Notes (Signed)
Reports [facility] with pain worst in chest and knees. Recently admitted for same and received blood transfusions.  ?

## 2021-05-03 ENCOUNTER — Encounter (HOSPITAL_COMMUNITY): Payer: Self-pay | Admitting: Internal Medicine

## 2021-05-03 ENCOUNTER — Emergency Department (HOSPITAL_COMMUNITY): Payer: Medicaid Other

## 2021-05-03 DIAGNOSIS — D5700 Hb-SS disease with crisis, unspecified: Secondary | ICD-10-CM | POA: Diagnosis not present

## 2021-05-03 DIAGNOSIS — R0789 Other chest pain: Secondary | ICD-10-CM | POA: Diagnosis not present

## 2021-05-03 DIAGNOSIS — Z79899 Other long term (current) drug therapy: Secondary | ICD-10-CM | POA: Diagnosis not present

## 2021-05-03 DIAGNOSIS — R509 Fever, unspecified: Secondary | ICD-10-CM | POA: Diagnosis not present

## 2021-05-03 DIAGNOSIS — D5701 Hb-SS disease with acute chest syndrome: Secondary | ICD-10-CM | POA: Diagnosis not present

## 2021-05-03 DIAGNOSIS — G894 Chronic pain syndrome: Secondary | ICD-10-CM | POA: Diagnosis not present

## 2021-05-03 DIAGNOSIS — R079 Chest pain, unspecified: Secondary | ICD-10-CM | POA: Diagnosis not present

## 2021-05-03 DIAGNOSIS — D72829 Elevated white blood cell count, unspecified: Secondary | ICD-10-CM | POA: Diagnosis not present

## 2021-05-03 DIAGNOSIS — E876 Hypokalemia: Secondary | ICD-10-CM | POA: Diagnosis not present

## 2021-05-03 DIAGNOSIS — Z20822 Contact with and (suspected) exposure to covid-19: Secondary | ICD-10-CM | POA: Diagnosis not present

## 2021-05-03 DIAGNOSIS — D638 Anemia in other chronic diseases classified elsewhere: Secondary | ICD-10-CM | POA: Diagnosis not present

## 2021-05-03 DIAGNOSIS — R0902 Hypoxemia: Secondary | ICD-10-CM | POA: Diagnosis not present

## 2021-05-03 LAB — COMPREHENSIVE METABOLIC PANEL
ALT: 12 U/L (ref 0–44)
AST: 57 U/L — ABNORMAL HIGH (ref 15–41)
Albumin: 4.1 g/dL (ref 3.5–5.0)
Alkaline Phosphatase: 96 U/L (ref 38–126)
Anion gap: 7 (ref 5–15)
BUN: 9 mg/dL (ref 6–20)
CO2: 18 mmol/L — ABNORMAL LOW (ref 22–32)
Calcium: 8.6 mg/dL — ABNORMAL LOW (ref 8.9–10.3)
Chloride: 110 mmol/L (ref 98–111)
Creatinine, Ser: 0.33 mg/dL — ABNORMAL LOW (ref 0.44–1.00)
GFR, Estimated: >60 mL/min (ref >60)
Glucose, Bld: 92 mg/dL (ref 70–99)
Potassium: 3.5 mmol/L (ref 3.5–5.1)
Sodium: 135 mmol/L (ref 135–145)
Total Bilirubin: 3.4 mg/dL — ABNORMAL HIGH (ref 0.3–1.2)
Total Protein: 7.5 g/dL (ref 6.5–8.1)

## 2021-05-03 LAB — CBC WITH DIFFERENTIAL/PLATELET
Abs Immature Granulocytes: 0.42 10*3/uL — ABNORMAL HIGH (ref 0.00–0.07)
Basophils Absolute: 0.1 10*3/uL (ref 0.0–0.1)
Basophils Relative: 0 %
Eosinophils Absolute: 0 10*3/uL (ref 0.0–0.5)
Eosinophils Relative: 0 %
HCT: 19.7 % — ABNORMAL LOW (ref 36.0–46.0)
Hemoglobin: 7.1 g/dL — ABNORMAL LOW (ref 12.0–15.0)
Immature Granulocytes: 3 %
Lymphocytes Relative: 12 %
Lymphs Abs: 2 10*3/uL (ref 0.7–4.0)
MCH: 32.4 pg (ref 26.0–34.0)
MCHC: 36 g/dL (ref 30.0–36.0)
MCV: 90 fL (ref 80.0–100.0)
Monocytes Absolute: 1.4 10*3/uL — ABNORMAL HIGH (ref 0.1–1.0)
Monocytes Relative: 9 %
Neutro Abs: 12.2 10*3/uL — ABNORMAL HIGH (ref 1.7–7.7)
Neutrophils Relative %: 76 %
Platelets: 362 10*3/uL (ref 150–400)
RBC: 2.19 MIL/uL — ABNORMAL LOW (ref 3.87–5.11)
RDW: 22.5 % — ABNORMAL HIGH (ref 11.5–15.5)
WBC: 16 10*3/uL — ABNORMAL HIGH (ref 4.0–10.5)
nRBC: 1 % — ABNORMAL HIGH (ref 0.0–0.2)

## 2021-05-03 LAB — RETICULOCYTES
Immature Retic Fract: 23.7 % — ABNORMAL HIGH (ref 2.3–15.9)
RBC.: 2.19 MIL/uL — ABNORMAL LOW (ref 3.87–5.11)
Retic Count, Absolute: 198.2 10*3/uL — ABNORMAL HIGH (ref 19.0–186.0)
Retic Ct Pct: 9.1 % — ABNORMAL HIGH (ref 0.4–3.1)

## 2021-05-03 LAB — CBC
HCT: 20.3 % — ABNORMAL LOW (ref 36.0–46.0)
Hemoglobin: 7.4 g/dL — ABNORMAL LOW (ref 12.0–15.0)
MCH: 33 pg (ref 26.0–34.0)
MCHC: 36.5 g/dL — ABNORMAL HIGH (ref 30.0–36.0)
MCV: 90.6 fL (ref 80.0–100.0)
Platelets: 349 10*3/uL (ref 150–400)
RBC: 2.24 MIL/uL — ABNORMAL LOW (ref 3.87–5.11)
RDW: 22.9 % — ABNORMAL HIGH (ref 11.5–15.5)
WBC: 15.5 10*3/uL — ABNORMAL HIGH (ref 4.0–10.5)
nRBC: 1.5 % — ABNORMAL HIGH (ref 0.0–0.2)

## 2021-05-03 LAB — CREATININE, SERUM: Creatinine, Ser: <0.3 mg/dL — ABNORMAL LOW (ref 0.44–1.00)

## 2021-05-03 LAB — LACTATE DEHYDROGENASE: LDH: 706 U/L — ABNORMAL HIGH (ref 98–192)

## 2021-05-03 LAB — HCG, SERUM, QUALITATIVE: Preg, Serum: NEGATIVE

## 2021-05-03 LAB — TROPONIN I (HIGH SENSITIVITY)
Troponin I (High Sensitivity): 4 ng/L (ref <18)
Troponin I (High Sensitivity): 8 ng/L (ref <18)

## 2021-05-03 MED ORDER — HYDROXYUREA 100 MG/ML ORAL SUSPENSION
1200.0000 mg | Freq: Every day | ORAL | Status: DC
  Administered 2021-05-03 – 2021-05-10 (×8): 1200 mg via ORAL

## 2021-05-03 MED ORDER — MORPHINE SULFATE 1 MG/ML IV SOLN PCA
INTRAVENOUS | Status: DC
  Administered 2021-05-03: 2 mg via INTRAVENOUS
  Administered 2021-05-04: 4 mg via INTRAVENOUS
  Administered 2021-05-04: 18 mg via INTRAVENOUS
  Administered 2021-05-04: 16 mg via INTRAVENOUS
  Administered 2021-05-05 – 2021-05-06 (×3): 6 mg via INTRAVENOUS
  Administered 2021-05-06: 8 mg via INTRAVENOUS
  Administered 2021-05-07: 12 mg via INTRAVENOUS
  Administered 2021-05-07: 12.99 mg via INTRAVENOUS
  Administered 2021-05-07: 0.801 mg via INTRAVENOUS
  Administered 2021-05-09: 13.8 mg via INTRAVENOUS
  Administered 2021-05-09: 12 mg via INTRAVENOUS
  Administered 2021-05-09: 6 mg via INTRAVENOUS
  Administered 2021-05-10: 4 mg via INTRAVENOUS
  Filled 2021-05-03 (×14): qty 30

## 2021-05-03 MED ORDER — VOXELOTOR 500 MG PO TABS
1500.0000 mg | ORAL_TABLET | Freq: Every day | ORAL | Status: DC
Start: 2021-05-03 — End: 2021-05-11
  Administered 2021-05-03 – 2021-05-10 (×8): 1500 mg via ORAL

## 2021-05-03 MED ORDER — ONDANSETRON HCL 4 MG/2ML IJ SOLN: 4.0000 mg | Freq: Four times a day (QID) | INTRAMUSCULAR | Status: DC | PRN

## 2021-05-03 MED ORDER — SODIUM CHLORIDE 0.9 % IV SOLN
500.0000 mg | INTRAVENOUS | Status: DC
  Filled 2021-05-03: qty 5

## 2021-05-03 MED ORDER — MORPHINE SULFATE 1 MG/ML IV SOLN PCA
INTRAVENOUS | Status: DC
  Filled 2021-05-03: qty 30

## 2021-05-03 MED ORDER — ACETAMINOPHEN 325 MG PO TABS
650.0000 mg | ORAL_TABLET | ORAL | Status: DC | PRN
  Administered 2021-05-03 – 2021-05-11 (×9): 650 mg via ORAL
  Filled 2021-05-03 (×9): qty 2

## 2021-05-03 MED ORDER — SODIUM CHLORIDE 0.45 % IV SOLN: INTRAVENOUS | Status: AC

## 2021-05-03 MED ORDER — MORPHINE SULFATE (PF) 4 MG/ML IV SOLN
6.0000 mg | INTRAVENOUS | Status: AC
  Administered 2021-05-03: 6 mg via INTRAVENOUS
  Filled 2021-05-03: qty 2

## 2021-05-03 MED ORDER — SODIUM CHLORIDE 0.9% FLUSH: 9.0000 mL | INTRAVENOUS | Status: DC | PRN

## 2021-05-03 MED ORDER — ONDANSETRON HCL 4 MG/2ML IJ SOLN
4.0000 mg | INTRAMUSCULAR | Status: DC | PRN
Start: 2021-05-03 — End: 2021-05-03

## 2021-05-03 MED ORDER — SODIUM CHLORIDE 0.9 % IV SOLN
500.0000 mg | Freq: Once | INTRAVENOUS | Status: AC
  Administered 2021-05-03: 500 mg via INTRAVENOUS
  Filled 2021-05-03: qty 5

## 2021-05-03 MED ORDER — SODIUM CHLORIDE 0.45 % IV SOLN: INTRAVENOUS | Status: DC

## 2021-05-03 MED ORDER — SODIUM CHLORIDE 0.9 % IV BOLUS
500.0000 mL | Freq: Once | INTRAVENOUS | Status: AC
  Administered 2021-05-03: 500 mL via INTRAVENOUS

## 2021-05-03 MED ORDER — NALOXONE HCL 0.4 MG/ML IJ SOLN: 0.4000 mg | INTRAMUSCULAR | Status: DC | PRN

## 2021-05-03 MED ORDER — SODIUM CHLORIDE 0.9 % IV SOLN
1.0000 g | Freq: Once | INTRAVENOUS | Status: AC
  Administered 2021-05-03: 1 g via INTRAVENOUS
  Filled 2021-05-03: qty 10

## 2021-05-03 MED ORDER — ACETAMINOPHEN 325 MG PO TABS
650.0000 mg | ORAL_TABLET | Freq: Once | ORAL | Status: AC
  Administered 2021-05-03: 650 mg via ORAL
  Filled 2021-05-03: qty 2

## 2021-05-03 MED ORDER — MORPHINE SULFATE (PF) 4 MG/ML IV SOLN
INTRAVENOUS | Status: AC
  Filled 2021-05-03: qty 2

## 2021-05-03 MED ORDER — DIPHENHYDRAMINE HCL 12.5 MG/5ML PO ELIX
12.5000 mg | ORAL_SOLUTION | Freq: Four times a day (QID) | ORAL | Status: DC | PRN
  Administered 2021-05-04 – 2021-05-08 (×5): 12.5 mg via ORAL
  Filled 2021-05-03 (×5): qty 5

## 2021-05-03 MED ORDER — ENOXAPARIN SODIUM 40 MG/0.4ML IJ SOSY
40.0000 mg | PREFILLED_SYRINGE | INTRAMUSCULAR | Status: DC
  Filled 2021-05-03 (×5): qty 0.4

## 2021-05-03 MED ORDER — OXYCODONE HCL 5 MG PO TABS
10.0000 mg | ORAL_TABLET | ORAL | Status: DC | PRN
  Filled 2021-05-03: qty 2

## 2021-05-03 MED ORDER — PROPRANOLOL HCL 20 MG PO TABS
20.0000 mg | ORAL_TABLET | Freq: Every day | ORAL | Status: DC
  Administered 2021-05-03 – 2021-05-10 (×6): 20 mg via ORAL
  Filled 2021-05-03 (×9): qty 1

## 2021-05-03 MED ORDER — MORPHINE SULFATE (PF) 4 MG/ML IV SOLN
4.0000 mg | Freq: Once | INTRAVENOUS | Status: AC
  Administered 2021-05-03: 4 mg via INTRAVENOUS
  Filled 2021-05-03: qty 1

## 2021-05-03 MED ORDER — MORPHINE SULFATE (PF) 4 MG/ML IV SOLN
8.0000 mg | INTRAVENOUS | Status: AC
  Administered 2021-05-03: 8 mg via INTRAVENOUS
  Filled 2021-05-03: qty 2

## 2021-05-03 MED ORDER — SODIUM CHLORIDE 0.9 % IV SOLN
12.5000 mg | Freq: Once | INTRAVENOUS | Status: AC
  Administered 2021-05-03: 12.5 mg via INTRAVENOUS
  Filled 2021-05-03: qty 12.5

## 2021-05-03 MED ORDER — SODIUM CHLORIDE 0.9 % IV SOLN
2.0000 g | INTRAVENOUS | Status: DC
  Filled 2021-05-03: qty 20

## 2021-05-03 MED ORDER — SENNOSIDES-DOCUSATE SODIUM 8.6-50 MG PO TABS
1.0000 | ORAL_TABLET | Freq: Two times a day (BID) | ORAL | Status: DC
  Administered 2021-05-03 – 2021-05-06 (×4): 1 via ORAL
  Filled 2021-05-03 (×9): qty 1

## 2021-05-03 MED ORDER — POLYETHYLENE GLYCOL 3350 17 G PO PACK: 17.0000 g | PACK | Freq: Every day | ORAL | Status: DC | PRN

## 2021-05-03 MED ORDER — MORPHINE SULFATE (PF) 4 MG/ML IV SOLN
8.0000 mg | Freq: Once | INTRAVENOUS | Status: AC
  Administered 2021-05-03: 8 mg via INTRAVENOUS
  Filled 2021-05-03: qty 2

## 2021-05-03 NOTE — ED Provider Notes (Signed)
?[facility] DEPT ?Provider Note ? ? ?CSN: [id number] ?Arrival date & time: 05/02/21  2346 ? ?  ? ?History ? ?Chief Complaint  ?Patient presents with  ? Sickle Cell Pain Crisis  ? ? ?Alicia Villegas is a 21 y.o. female with a history of sickle cell anemia, prior SAH/SDH, cholecystectomy, and splenectomy who presents to the ED with complaints sickle cell pain crisis today. Reports generalized pain but most primarily to the chest and bilateral knees, no alleviating factors, feels like prior sickle cell pain. Feels short of breath due to severe pain. Denies fever, hemoptysis, nausea, vomiting, or abdominal pain.  ? ?HPI ? ?  ? ?Home Medications ?Prior to Admission medications   ?Medication Sig Start Date End Date Taking? Authorizing Provider  ?acetaminophen (TYLENOL) 325 MG tablet Take 650 mg by mouth every 6 (six) hours as needed for moderate pain or mild pain.    [provider]  ?hydroxyurea (HYDREA) 100 mg/mL SUSP Take 1,200 mg by mouth daily.    [provider]  ?ibuprofen (ADVIL) 200 MG tablet Take 400 mg by mouth every 6 (six) hours as needed for moderate pain or mild pain.    [provider]  ?oxyCODONE-acetaminophen (PERCOCET) 5-325 MG tablet Take 1 tablet by mouth every 4 (four) hours as needed for severe pain. ?Patient not taking: Reported on 04/27/2021 03/23/21 03/23/22  Dorena Dew, FNP  ?propranolol (INDERAL) 20 MG tablet Take 1 tablet (20 mg total) by mouth daily. 04/28/21   Dorena Dew, FNP  ?voxelotor (OXBRYTA) 500 MG TABS tablet Take 1,500 mg by mouth daily. 10/19/20   [provider]  ?Water For Irrigation, Sterile (STERILE WATER FOR IRRIGATION)  04/03/21   [provider]  ?   ? ?Allergies    ?Patient has no known allergies.   ? ?Review of Systems   ?Review of Systems  ?Constitutional:  Negative for fever.  ?Respiratory:  Positive for shortness of breath.   ?Cardiovascular:  Positive for chest pain.  ?Gastrointestinal:   Negative for abdominal pain, nausea and vomiting.  ?Musculoskeletal:  Positive for arthralgias.  ?All other systems reviewed and are negative. ? ?Physical Exam ?Updated Vital Signs ?BP 118/86 (BP Location: Right Arm)   Pulse (!) 109   Temp 99.9 ?F (37.7 ?C) (Oral)   Resp 19   Ht '5\' 1"'$  (1.549 m)   Wt 50.3 kg   LMP  (LMP Unknown)   SpO2 93%   BMI 20.97 kg/m?  ?Physical Exam ?Vitals and nursing note reviewed.  ?Constitutional:   ?   General: She is in acute distress (appears uncomfortable).  ?   Appearance: She is well-developed. She is not toxic-appearing.  ?HENT:  ?   Head: Normocephalic and atraumatic.  ?Eyes:  ?   General:     ?   Right eye: No discharge.     ?   Left eye: No discharge.  ?   Conjunctiva/sclera: Conjunctivae normal.  ?Cardiovascular:  ?   Rate and Rhythm: Regular rhythm. Tachycardia present.  ?   Comments: 2+ Dp pulses.  ?Pulmonary:  ?   Effort: No respiratory distress.  ?   Breath sounds: Normal breath sounds. No wheezing or rales.  ?Abdominal:  ?   General: There is no distension.  ?   Palpations: Abdomen is soft.  ?   Tenderness: There is no abdominal tenderness.  ?Musculoskeletal:  ?   Cervical back: Neck supple.  ?   Comments: Lower extremities: no obvious deformities, edema,  or erythema. Ranging all major joints. Compartments are soft.   ?Skin: ?   General: Skin is warm and dry.  ?Neurological:  ?   Mental Status: She is alert.  ?   Comments: Clear speech.   ?Psychiatric:     ?   Behavior: Behavior normal.  ? ? ?ED Results / Procedures / Treatments   ?Labs ?(all labs ordered are listed, but only abnormal results are displayed) ?Labs Reviewed  ?CBC WITH DIFFERENTIAL/PLATELET  ?RETICULOCYTES  ?BASIC METABOLIC PANEL  ?HCG, SERUM, QUALITATIVE  ?TROPONIN I (HIGH SENSITIVITY)  ? ? ?EKG ?EKG Interpretation ? ?Date/Time:  Tuesday May 02 2021 23:59:53 EDT ?Ventricular Rate:  102 ?PR Interval:  157 ?QRS Duration: 80 ?QT Interval:  335 ?QTC Calculation: 437 ?R Axis:   81 ?Text  Interpretation: Sinus tachycardia Confirmed by Dory Horn) on 05/03/2021 12:03:36 AM ? ?Radiology ?DG Chest 2 View ? ?Result Date: 05/03/2021 ?CLINICAL DATA:  Chest pain. EXAM: CHEST - 2 VIEW COMPARISON:  Chest radiograph dated 03/08/2021. FINDINGS: No focal consolidation, pleural effusion or pneumothorax. Borderline cardiomegaly. No acute osseous pathology. IMPRESSION: No acute cardiopulmonary process. Borderline cardiomegaly. Electronically Signed   By: Anner Crete M.D.   On: 05/03/2021 01:02   ? ?Procedures ?Marland KitchenCritical Care ?Performed by: Amaryllis Dyke, PA-C ?Authorized by: Amaryllis Dyke, PA-C  ?  ?CRITICAL CARE ?Performed by: Kennith Maes ? ? ?Total critical care time: 40 minutes ? ?Critical care time was exclusive of separately billable procedures and treating other patients. ? ?Critical care was necessary to treat or prevent imminent or life-threatening deterioration. ? ?Critical care was time spent personally by me on the following activities: development of treatment plan with patient and/or surrogate as well as nursing, discussions with consultants, evaluation of patient's response to treatment, examination of patient, obtaining history from patient or surrogate, ordering and performing treatments and interventions, ordering and review of laboratory studies, ordering and review of radiographic studies, pulse oximetry and re-evaluation of patient's condition. ? ? ? ?Medications Ordered in ED ?Medications  ?0.45 % sodium chloride infusion (has no administration in time range)  ?morphine (PF) 4 MG/ML injection 6 mg (has no administration in time range)  ?morphine (PF) 4 MG/ML injection 8 mg (has no administration in time range)  ?diphenhydrAMINE (BENADRYL) 12.5 mg in sodium chloride 0.9 % 50 mL IVPB (has no administration in time range)  ?ondansetron (ZOFRAN) injection 4 mg (has no administration in time range)  ? ? ?ED Course/ Medical Decision Making/ A&P ?  ?                         ?Medical Decision Making ?Amount and/or Complexity of Data Reviewed ?Labs: ordered. ?Radiology: ordered. ? ?Risk ?Prescription drug management. ?Decision regarding hospitalization. ? ? ?Patient presents to the ED with complaints of sickle cell pain crisis, this involves an extensive number of treatment options, and is a complaint that carries with it a high risk of complications and morbidity. Nontoxic, vitals w/ tachycardia. Patient reports better response with morphine previously- sickle cell pain management initiated, labs, EKG, CXR pending.  ? ?Additional history obtained:  ?Chart & nursing note reviewed.  ?Recent hospital admission, received transfusion during stay.  ? ?EKG: Sinus tachycardia.  ? ?Per nursing staff significant difficulty with IV access and blood draw, ultimately were able to place a small IV but were unable to draw blood.  Analgesics have been administered. ? ?Lab Tests:  ?I reviewed & interpreted labs including:  ?Leukocytosis  at 16,000 with left shift.  Hemoglobin 7.1, similar to prior ranges for this patient. ?CMP: Total bilirubin mildly increased from prior ?Reticulocytes: Elevated some ?Troponin: Within normal limits ? ?Imaging Studies ordered:  ?I ordered and viewed the following imaging, agree with radiologist impression:  ?Chest x-ray: No acute cardiopulmonary process. Borderline cardiomegaly.  ? ?03:30: RE-EVAL: Patient reports minimal pain improvement, current discomfort is an 8 out of 10 in severity.  I personally rechecked the patient's temperature, she is febrile to 100.4.  She remains persistently tachycardic.  She also is noted to have a leukocytosis.  Given her complaints of chest pain and shortness of breath in the setting of fever and tachycardia with elevated white blood cell count there is concern for acute chest syndrome despite clear chest x-ray.  We will start antibiotics at this time.  Blood cultures ordered as well. COVID testing pending. Considered pulmonary  embolism as well however do not have IV access large enough for CTA at this time, currently saturating well on RA, continue to have difficulty with IV access, IV team is @ bedside. .  Will discuss with medicine for admission.

## 2021-05-03 NOTE — H&P (Signed)
?History and Physical  ? ? ?Alicia Villegas PJK:[id number] DOB: [birth year] DOA: 05/02/2021 ? ?PCP: Dorena Dew, FNP  ?Patient coming from: Home. ? ?Chief Complaint: Pain. ? ?HPI: Alicia Villegas is a 21 y.o. female with history of sickle cell anemia presents to the ER because of increasing pain since last evening.  Pain is mostly across the chest and lower back and lower extremity.  Denies any productive cough headache nausea vomiting or diarrhea. ? ?ED Course: In the ER patient was not hypoxic but became febrile with temperature 100.4 ?F with labs showing leukocytosis chest x-ray unremarkable.  Patient was started on fluids pain medication and blood cultures obtained.  COVID test is pending.  Pregnancy screen is pending.  Patient was also started on IV antibiotics as there was concern for acute hip syndrome. ? ?Review of Systems: As per HPI, rest all negative. ? ? ?Past Medical History:  ?Diagnosis Date  ? Cancer [facility])   ? Hepatosplenomegaly 02/05/2006  ? associated with chronic malaria  ? Malaria 02/05/2006  ? 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 Sickle cell anemia ([name])   ? Sickle cell anemia ([name])   ? type SS  ? ? ?Past Surgical History:  ?Procedure Laterality Date  ? CHOLECYSTECTOMY    ? DG GALL BLADDER    ? SPLENECTOMY    ? SPLENECTOMY, TOTAL    ? ? ? reports that she has never smoked. She has never used smokeless tobacco. She reports that she does not drink alcohol and does not use drugs. ? ?No Known Allergies ? ?Family History  ?Family history unknown: Yes  ? ? ?Prior to Admission medications   ?Medication Sig Start Date End Date Taking? Authorizing Provider  ?acetaminophen (TYLENOL) 325 MG tablet Take 650 mg by mouth every 6 (six) hours as needed for moderate pain or mild pain.    [provider]  ?hydroxyurea (HYDREA) 100 mg/mL SUSP Take 1,200 mg by mouth daily.    [provider]  ?ibuprofen (ADVIL) 200 MG tablet Take 400 mg by mouth every 6 (six)  hours as needed for moderate pain or mild pain.    [provider]  ?oxyCODONE-acetaminophen (PERCOCET) 5-325 MG tablet Take 1 tablet by mouth every 4 (four) hours as needed for severe pain. ?Patient not taking: Reported on 04/27/2021 03/23/21 03/23/22  Dorena Dew, FNP  ?propranolol (INDERAL) 20 MG tablet Take 1 tablet (20 mg total) by mouth daily. 04/28/21   Dorena Dew, FNP  ?voxelotor (OXBRYTA) 500 MG TABS tablet Take 1,500 mg by mouth daily. 10/19/20   [provider]  ?Water For Irrigation, Sterile (STERILE WATER FOR IRRIGATION)  04/03/21   [provider]  ? ? ?Physical Exam: ?Constitutional: Moderately built and nourished. ?Vitals:  ? 05/03/21 0041 05/03/21 0208 05/03/21 0230 05/03/21 0436  ?BP:  (!) 117/59 116/84   ?Pulse: (!) 101 (!) 111 (!) 118   ?Resp: 19 (!) 22 18   ?Temp:    (!) 100.4 ?F (38 ?C)  ?TempSrc:    Oral  ?SpO2: 100% 96% 97%   ?Weight:      ?Height:      ? ?Eyes: Anicteric no pallor. ?ENMT: No discharge from the ears eyes nose and mouth. ?Neck: No mass felt.  No neck rigidity. ?Respiratory: No rhonchi or crepitations. ?Cardiovascular: S1-S2 heard. ?Abdomen: Soft nontender bowel sound present. ?Musculoskeletal: No edema. ?Skin: No rash. ?Neurologic: Alert awake oriented time place and person.  Moves all extremities. ?  Psychiatric: Appears normal.  Normal affect. ? ? ?Labs on Admission: I have personally reviewed following labs and imaging studies ? ?CBC: ?Recent Labs  ?Lab 04/27/21 ?1035 04/28/21 ?0456 05/03/21 ?0243  ?WBC 9.6 7.6 16.0*  ?NEUTROABS 5.0  --  12.2*  ?HGB 6.9* 8.8* 7.1*  ?HCT 19.2* 23.9* 19.7*  ?MCV 93.2 89.2 90.0  ?PLT 300 327 362  ? ?Basic Metabolic Panel: ?Recent Labs  ?Lab 04/27/21 ?1035 04/28/21 ?0755 05/03/21 ?0243  ?NA 135 136 135  ?K 3.5 4.3 3.5  ?CL 108 111 110  ?CO2 19* 18* 18*  ?GLUCOSE 95 90 92  ?BUN '6 7 9  '$ ?CREATININE 0.33* 0.32* 0.33*  ?CALCIUM 9.0 8.8* 8.6*  ? ?GFR: ?Estimated Creatinine Clearance: 85.4 mL/min (A) (by C-G formula  based on SCr of 0.33 mg/dL (L)). ?Liver Function Tests: ?Recent Labs  ?Lab 04/27/21 ?1035 05/03/21 ?0243  ?AST 28 57*  ?ALT 11 12  ?ALKPHOS 95 96  ?BILITOT 2.5* 3.4*  ?PROT 8.5* 7.5  ?ALBUMIN 4.7 4.1  ? ?No results for input(s): LIPASE, AMYLASE in the last 168 hours. ?No results for input(s): AMMONIA in the last 168 hours. ?Coagulation Profile: ?No results for input(s): INR, PROTIME in the last 168 hours. ?Cardiac Enzymes: ?No results for input(s): CKTOTAL, CKMB, CKMBINDEX, TROPONINI in the last 168 hours. ?BNP (last 3 results) ?No results for input(s): PROBNP in the last 8760 hours. ?HbA1C: ?No results for input(s): HGBA1C in the last 72 hours. ?CBG: ?No results for input(s): GLUCAP in the last 168 hours. ?Lipid Profile: ?No results for input(s): CHOL, HDL, LDLCALC, TRIG, CHOLHDL, LDLDIRECT in the last 72 hours. ?Thyroid Function Tests: ?No results for input(s): TSH, T4TOTAL, FREET4, T3FREE, THYROIDAB in the last 72 hours. ?Anemia Panel: ?Recent Labs  ?  05/03/21 ?0243  ?RETICCTPCT 9.1*  ? ?Urine analysis: ?   ?Component Value Date/Time  ? COLORURINE AMBER (A) 03/09/2021 0000  ? APPEARANCEUR HAZY (A) 03/09/2021 0000  ? LABSPEC 1.020 03/09/2021 0000  ? PHURINE 6.0 03/09/2021 0000  ? GLUCOSEU NEGATIVE 03/09/2021 0000  ? HGBUR LARGE (A) 03/09/2021 0000  ? BILIRUBINUR small (A) 04/11/2021 1241  ? BILIRUBINUR 2+ 05/09/2020 1537  ? KETONESUR negative 04/11/2021 1241  ? [place] NEGATIVE 03/09/2021 0000  ? PROTEINUR NEGATIVE 03/09/2021 0000  ? UROBILINOGEN 2.0 (A) 04/11/2021 1241  ? UROBILINOGEN 1.0 01/13/2014 1612  ? NITRITE Negative 04/11/2021 1241  ? NITRITE NEGATIVE 03/09/2021 0000  ? LEUKOCYTESUR Large (3+) (A) 04/11/2021 1241  ? LEUKOCYTESUR SMALL (A) 03/09/2021 0000  ? ?Sepsis Labs: ?'@LABRCNTIP'$ (procalcitonin:4,lacticidven:4) ?) ?Recent Results (from the past 240 hour(s))  ?Resp Panel by RT-PCR (Flu A&B, Covid) Nasopharyngeal Swab     Status: None  ? Collection Time: 04/27/21  4:04 PM  ? Specimen: Nasopharyngeal  Swab; Nasopharyngeal(NP) swabs in vial transport medium  ?Result Value Ref Range Status  ? SARS Coronavirus 2 by RT PCR NEGATIVE NEGATIVE Final  ?  Comment: (NOTE) ?SARS-CoV-2 target nucleic acids are NOT DETECTED. ? ?The SARS-CoV-2 RNA is generally detectable in upper respiratory ?specimens during the acute phase of infection. The lowest ?concentration of SARS-CoV-2 viral copies this assay can detect is ?138 copies/mL. A negative result does not preclude SARS-Cov-2 ?infection and should not be used as the sole basis for treatment or ?other patient management decisions. A negative result may occur with  ?improper specimen collection/handling, submission of specimen other ?than nasopharyngeal swab, presence of viral mutation(s) within the ?areas targeted by this assay, and inadequate number of viral ?copies(<138 copies/mL). A negative result must be  combined with ?clinical observations, patient history, and epidemiological ?information. The expected result is Negative. ? ?Fact Sheet for Patients:  ?EntrepreneurPulse.com.au ? ?Fact Sheet for Healthcare Providers:  ?IncredibleEmployment.be ? ?This test is no t yet approved or cleared by the [place] FDA and  ?has been authorized for detection and/or diagnosis of SARS-CoV-2 by ?FDA under an Emergency Use Authorization (EUA). This EUA will remain  ?in effect (meaning this test can be used) for the duration of the ?COVID-19 declaration under Section 564(b)(1) of the Act, 21 ?U.S.C.section 360bbb-3(b)(1), unless the authorization is terminated  ?or revoked sooner.  ? ? ?  ? Influenza A by PCR NEGATIVE NEGATIVE Final  ? Influenza B by PCR NEGATIVE NEGATIVE Final  ?  Comment: (NOTE) ?The Xpert Xpress SARS-CoV-2/FLU/RSV plus assay is intended as an aid ?in the diagnosis of influenza from Nasopharyngeal swab specimens and ?should not be used as a sole basis for treatment. Nasal washings and ?aspirates are unacceptable for Xpert Xpress  SARS-CoV-2/FLU/RSV ?testing. ? ?Fact Sheet for Patients: ?EntrepreneurPulse.com.au ? ?Fact Sheet for Healthcare Providers: ?IncredibleEmployment.be ? ?This test is not yet app

## 2021-05-03 NOTE — Progress Notes (Addendum)
Alicia Villegas is a 21 year old female with a medical history significant for sickle cell disease that was admitted overnight for sickle cell pain crisis.  Patient states that pain intensity has been increased and is primarily to her lower extremities.  Patient denies any chest pains or shortness of breath at this time.  Her pain intensity is 10/10.  Pain is currently unrelieved by full dose of morphine PCA. ? ?Care plan: ?There are no signs of acute chest syndrome, discontinue antibiotics.  We will continue to follow closely.  Patient currently afebrile. ?Custom dose PCA morphine, 2 mg an hour, 10-minute lockout, and 9 mg/h. ?Oxycodone 10 mg every 4 hours as needed for severe breakthrough pain. ?Toradol 15 mg IV every 6 hours ?Follow labs in AM. ? ?We will reassess in AM. ? ? ?Donia Pounds  APRN, MSN, FNP-C ?Patient [place] ?[facility] ?[address]  ?[place], [place] [address] ?[phone] ? ? ? ?

## 2021-05-03 NOTE — Telephone Encounter (Signed)
Transition Care Management Unsuccessful Follow-up Telephone Call ? ?Date of discharge and from where:  04/28/2021 from [facility] ? ?Attempts:  2nd Attempt ? ?Reason for unsuccessful TCM follow-up call:  Left voice message ? ? ?Patient was readmitted yesterday.  ?

## 2021-05-04 DIAGNOSIS — D5700 Hb-SS disease with crisis, unspecified: Secondary | ICD-10-CM | POA: Diagnosis not present

## 2021-05-04 LAB — BLOOD CULTURE ID PANEL (REFLEXED) - BCID2

## 2021-05-04 LAB — COMPREHENSIVE METABOLIC PANEL
ALT: 54 U/L — ABNORMAL HIGH (ref 0–44)
AST: 87 U/L — ABNORMAL HIGH (ref 15–41)
Albumin: 3.4 g/dL — ABNORMAL LOW (ref 3.5–5.0)
Alkaline Phosphatase: 122 U/L (ref 38–126)
Anion gap: 6 (ref 5–15)
BUN: 6 mg/dL (ref 6–20)
CO2: 19 mmol/L — ABNORMAL LOW (ref 22–32)
Calcium: 8.4 mg/dL — ABNORMAL LOW (ref 8.9–10.3)
Chloride: 109 mmol/L (ref 98–111)
Creatinine, Ser: <0.3 mg/dL — ABNORMAL LOW (ref 0.44–1.00)
Glucose, Bld: 88 mg/dL (ref 70–99)
Potassium: 3.3 mmol/L — ABNORMAL LOW (ref 3.5–5.1)
Sodium: 134 mmol/L — ABNORMAL LOW (ref 135–145)
Total Bilirubin: 5.3 mg/dL — ABNORMAL HIGH (ref 0.3–1.2)
Total Protein: 6.6 g/dL (ref 6.5–8.1)

## 2021-05-04 LAB — CBC
HCT: 18.2 % — ABNORMAL LOW (ref 36.0–46.0)
Hemoglobin: 6.4 g/dL — CL (ref 12.0–15.0)
MCH: 32.7 pg (ref 26.0–34.0)
MCHC: 35.2 g/dL (ref 30.0–36.0)
MCV: 92.9 fL (ref 80.0–100.0)
Platelets: 340 10*3/uL (ref 150–400)
RBC: 1.96 MIL/uL — ABNORMAL LOW (ref 3.87–5.11)
RDW: 24 % — ABNORMAL HIGH (ref 11.5–15.5)
WBC: 10.3 10*3/uL (ref 4.0–10.5)
nRBC: 5.6 % — ABNORMAL HIGH (ref 0.0–0.2)

## 2021-05-04 LAB — PREPARE RBC (CROSSMATCH)

## 2021-05-04 LAB — LACTATE DEHYDROGENASE: LDH: 739 U/L — ABNORMAL HIGH (ref 98–192)

## 2021-05-04 MED ORDER — MORPHINE SULFATE 15 MG PO TABS
15.0000 mg | ORAL_TABLET | ORAL | Status: DC | PRN
  Administered 2021-05-04 – 2021-05-06 (×4): 15 mg via ORAL
  Filled 2021-05-04 (×4): qty 1

## 2021-05-04 MED ORDER — ACETAMINOPHEN 325 MG PO TABS
650.0000 mg | ORAL_TABLET | Freq: Once | ORAL | Status: AC
  Administered 2021-05-05: 650 mg via ORAL
  Filled 2021-05-04: qty 2

## 2021-05-04 MED ORDER — DIPHENHYDRAMINE HCL 25 MG PO CAPS
25.0000 mg | ORAL_CAPSULE | Freq: Once | ORAL | Status: DC
  Filled 2021-05-04: qty 1

## 2021-05-04 MED ORDER — KETOROLAC TROMETHAMINE 30 MG/ML IJ SOLN
15.0000 mg | Freq: Four times a day (QID) | INTRAMUSCULAR | Status: AC
  Administered 2021-05-04 – 2021-05-09 (×19): 15 mg via INTRAVENOUS
  Filled 2021-05-04 (×21): qty 1

## 2021-05-04 MED ORDER — SODIUM CHLORIDE 0.9% IV SOLUTION: Freq: Once | INTRAVENOUS | Status: AC

## 2021-05-04 MED ORDER — IBUPROFEN 200 MG PO TABS
200.0000 mg | ORAL_TABLET | Freq: Once | ORAL | Status: AC
  Administered 2021-05-04: 200 mg via ORAL
  Filled 2021-05-04: qty 1

## 2021-05-04 MED ORDER — CEFAZOLIN SODIUM-DEXTROSE 2-4 GM/100ML-% IV SOLN
2.0000 g | Freq: Three times a day (TID) | INTRAVENOUS | Status: DC
  Administered 2021-05-04: 2 g via INTRAVENOUS
  Filled 2021-05-04 (×2): qty 100

## 2021-05-04 NOTE — Progress Notes (Signed)
Subjective: ?Alicia Villegas is a 21 year old female with a medical history significant for sickle cell disease that was admitted for sickle cell pain crisis. ?Today, patient continues to complain of severe pain primarily to upper and lower extremities.  Patient states that she is unable to get relief from IV Dilaudid PCA.  Patient has not requested any oral medications. ?Patient's hemoglobin is 6.4 g/dL, which is decreased from her baseline.  She denies any dizziness, headache, shortness of breath, urinary symptoms, nausea, vomiting, or diarrhea. ? ?Objective: ? ?Vital signs in last 24 hours: ? ?Vitals:  ? 05/04/21 1700 05/04/21 1247 05/04/21 1252 05/04/21 1738  ?BP:   112/74 111/63  ?Pulse: 96  (!) 111 (!) 119  ?Resp:  18 20   ?Temp:   100.3 ?F (37.9 ?C) 99.8 ?F (37.7 ?C)  ?TempSrc:   Oral Oral  ?SpO2:   96% 97%  ?Weight:      ?Height:      ? ? ?Intake/Output from previous day: ? ? ?Intake/Output Summary (Last 24 hours) at 05/04/2021 1846 ?Last data filed at 05/04/2021 1741 ?Gross per 24 hour  ?Intake 2324.2 ml  ?Output --  ?Net 2324.2 ml  ? ? ?Physical Exam: ?General: Alert, awake, oriented x3, in no acute distress.  ?HEENT: /AT PEERL, EOMI ?Neck: Trachea midline,  no masses, no thyromegal,y no JVD, no carotid bruit ?OROPHARYNX:  Moist, No exudate/ erythema/lesions.  ?Heart: Regular rate and rhythm, without murmurs, rubs, gallops, PMI non-displaced, no heaves or thrills on palpation.  ?Lungs: Clear to auscultation, no wheezing or rhonchi noted. No increased vocal fremitus resonant to percussion  ?Abdomen: Soft, nontender, nondistended, positive bowel sounds, no masses no hepatosplenomegaly noted.Marland Kitchen  ?Neuro: No focal neurological deficits noted cranial nerves II through XII grossly intact. DTRs 2+ bilaterally upper and lower extremities. Strength 5 out of 5 in bilateral upper and lower extremities. ?Musculoskeletal: No warm swelling or erythema around joints, no spinal tenderness noted. ?Psychiatric: Patient alert and  oriented x3, good insight and cognition, good recent to remote recall. ?Lymph node survey: No cervical axillary or inguinal lymphadenopathy noted. ? ?Lab Results: ? ?Basic Metabolic Panel: ?   ?Component Value Date/Time  ? NA 134 (L) 05/04/2021 0909  ? NA 139 11/08/2020 1100  ? K 3.3 (L) 05/04/2021 0909  ? CL 109 05/04/2021 0909  ? CO2 19 (L) 05/04/2021 0909  ? BUN 6 05/04/2021 0909  ? BUN 5 (L) 11/08/2020 1100  ? CREATININE <0.30 (L) 05/04/2021 0909  ? CREATININE 0.30 (L) 05/02/2015 1442  ? GLUCOSE 88 05/04/2021 0909  ? CALCIUM 8.4 (L) 05/04/2021 0909  ? ?CBC: ?   ?Component Value Date/Time  ? WBC 10.3 05/04/2021 0909  ? HGB 6.4 (LL) 05/04/2021 0909  ? HGB 7.7 (L) 11/08/2020 1100  ? HCT 18.2 (L) 05/04/2021 0909  ? HCT 23.2 (L) 11/08/2020 1100  ? PLT 340 05/04/2021 0909  ? PLT 665 (H) 11/08/2020 1100  ? MCV 92.9 05/04/2021 0909  ? MCV 91 11/08/2020 1100  ? NEUTROABS 12.2 (H) 05/03/2021 0243  ? NEUTROABS 4.6 11/08/2020 1100  ? LYMPHSABS 2.0 05/03/2021 0243  ? LYMPHSABS 3.0 11/08/2020 1100  ? MONOABS 1.4 (H) 05/03/2021 0243  ? EOSABS 0.0 05/03/2021 0243  ? EOSABS 0.1 11/08/2020 1100  ? BASOSABS 0.1 05/03/2021 0243  ? BASOSABS 0.1 11/08/2020 1100  ? ? ?Recent Results (from the past 240 hour(s))  ?Resp Panel by RT-PCR (Flu A&B, Covid) Nasopharyngeal Swab     Status: None  ? Collection Time: 04/27/21  4:04  PM  ? Specimen: Nasopharyngeal Swab; Nasopharyngeal(NP) swabs in vial transport medium  ?Result Value Ref Range Status  ? SARS Coronavirus 2 by RT PCR NEGATIVE NEGATIVE Final  ?  Comment: (NOTE) ?SARS-CoV-2 target nucleic acids are NOT DETECTED. ? ?The SARS-CoV-2 RNA is generally detectable in upper respiratory ?specimens during the acute phase of infection. The lowest ?concentration of SARS-CoV-2 viral copies this assay can detect is ?138 copies/mL. A negative result does not preclude SARS-Cov-2 ?infection and should not be used as the sole basis for treatment or ?other patient management decisions. A negative result  may occur with  ?improper specimen collection/handling, submission of specimen other ?than nasopharyngeal swab, presence of viral mutation(s) within the ?areas targeted by this assay, and inadequate number of viral ?copies(<138 copies/mL). A negative result must be combined with ?clinical observations, patient history, and epidemiological ?information. The expected result is Negative. ? ?Fact Sheet for Patients:  ?EntrepreneurPulse.com.au ? ?Fact Sheet for Healthcare Providers:  ?IncredibleEmployment.be ? ?This test is no t yet approved or cleared by the [place] FDA and  ?has been authorized for detection and/or diagnosis of SARS-CoV-2 by ?FDA under an Emergency Use Authorization (EUA). This EUA will remain  ?in effect (meaning this test can be used) for the duration of the ?COVID-19 declaration under Section 564(b)(1) of the Act, 21 ?U.S.C.section 360bbb-3(b)(1), unless the authorization is terminated  ?or revoked sooner.  ? ? ?  ? Influenza A by PCR NEGATIVE NEGATIVE Final  ? Influenza B by PCR NEGATIVE NEGATIVE Final  ?  Comment: (NOTE) ?The Xpert Xpress SARS-CoV-2/FLU/RSV plus assay is intended as an aid ?in the diagnosis of influenza from Nasopharyngeal swab specimens and ?should not be used as a sole basis for treatment. Nasal washings and ?aspirates are unacceptable for Xpert Xpress SARS-CoV-2/FLU/RSV ?testing. ? ?Fact Sheet for Patients: ?EntrepreneurPulse.com.au ? ?Fact Sheet for Healthcare Providers: ?IncredibleEmployment.be ? ?This test is not yet approved or cleared by the [place] FDA and ?has been authorized for detection and/or diagnosis of SARS-CoV-2 by ?FDA under an Emergency Use Authorization (EUA). This EUA will remain ?in effect (meaning this test can be used) for the duration of the ?COVID-19 declaration under Section 564(b)(1) of the Act, 21 U.S.C. ?section 360bbb-3(b)(1), unless the authorization is terminated  or ?revoked. ? ?Performed at [facility], [place] [address] ?[place], [place] [address] ?  ?Blood culture (routine x 2)     Status: Abnormal (Preliminary result)  ? Collection Time: 05/03/21  5:29 AM  ? Specimen: BLOOD RIGHT HAND  ?Result Value Ref Range Status  ? Specimen Description   Final  ?  BLOOD RIGHT HAND ?Performed at [facility], [place] [address] [place], [place] [address] ?  ? Special Requests   Final  ?  BOTTLES DRAWN AEROBIC ONLY Blood Culture results may not be optimal due to an inadequate volume of blood received in culture bottles ?Performed at [facility], [place] [address], [place], [place] [address] ?  ? Culture  Setup Time   Final  ?  GRAM POSITIVE COCCI ?IN PEDIATRIC BOTTLE ?CRITICAL RESULT CALLED TO, READ BACK BY AND VERIFIED WITH: L POINDEXTER,PHARMD'@0250'$  05/04/21 [place] ?  ? Culture (A)  Final  ?  STAPHYLOCOCCUS WARNERI ?CULTURE REINCUBATED FOR BETTER GROWTH ?Performed at [facility], [place] [name] [place], [place] [address] ?  ? Report Status PENDING  Incomplete  ?Blood Culture ID Panel (Reflexed)     Status: Abnormal  ? Collection Time: 05/03/21  5:29  AM  ?Result Value Ref Range Status  ? Enterococcus faecalis NOT DETECTED NOT DETECTED Final  ? Enterococcus Faecium NOT DETECTED NOT DETECTED Final  ? Listeria monocytogenes NOT DETECTED NOT DETECTED Final  ? Staphylococcus species DETECTED (A) NOT DETECTED Final  ?  Comment: CRITICAL RESULT CALLED TO, READ BACK BY AND VERIFIED WITH: ?L POINDEXTER,PHARMD'@0250'$  05/04/21 [place] ?  ? Staphylococcus aureus (BCID) NOT DETECTED NOT DETECTED Final  ? Staphylococcus epidermidis DETECTED (A) NOT DETECTED Final  ?  Comment: CRITICAL RESULT CALLED TO, READ BACK BY AND VERIFIED WITH: ?L POINDEXTER,PHARMD'@0250'$  05/04/21 [place] ?  ? Staphylococcus lugdunensis NOT DETECTED NOT DETECTED Final  ? Streptococcus species NOT DETECTED NOT DETECTED Final  ? Streptococcus agalactiae NOT DETECTED NOT DETECTED Final  ?  Streptococcus pneumoniae NOT DETECTED NOT DETECTED Final  ? Streptococcus pyogenes NOT DETECTED NOT DETECTED Final  ? A.calcoaceticus-baumannii NOT DETECTED NOT DETECTED Final  ? Bacteroides fragilis NO

## 2021-05-04 NOTE — Progress Notes (Signed)
PHARMACY - PHYSICIAN COMMUNICATION ?CRITICAL VALUE ALERT - BLOOD CULTURE IDENTIFICATION (BCID) ? ?Alicia Villegas is an 21 y.o. female who presented to [facility] on 05/02/2021 with sickle cell pain. ? ?Assessment: BCID + Staph epidermidis in 1 of 2 bottles.  No methicillin resistance detected ? ?Name of physician (or Provider) Contacted: Gershon Cull, FNP ? ?Current antibiotics: Ceftriaxone 1gm q24h + Azithromycin '500mg'$  q24h (originally for acute chest syndrome).  Progress note on 3/29 by [place] Hollis, FNP stated no signs of acute chest syndrome and to d/c antibiotics ? ?Changes to prescribed antibiotics recommended:  ?Recommendations accepted by provider ?Will d/c ceftriaxone and azithromycin ?Begin Cefazolin 2g IV q8h for staph epidermidis in blood culture ? ?Results for orders placed or performed during the hospital encounter of 05/02/21  ?Blood Culture ID Panel (Reflexed) (Collected: 05/03/2021  5:29 AM)  ?Result Value Ref Range  ? Enterococcus faecalis NOT DETECTED NOT DETECTED  ? Enterococcus Faecium NOT DETECTED NOT DETECTED  ? Listeria monocytogenes NOT DETECTED NOT DETECTED  ? Staphylococcus species DETECTED (A) NOT DETECTED  ? Staphylococcus aureus (BCID) NOT DETECTED NOT DETECTED  ? Staphylococcus epidermidis DETECTED (A) NOT DETECTED  ? Staphylococcus lugdunensis NOT DETECTED NOT DETECTED  ? Streptococcus species NOT DETECTED NOT DETECTED  ? Streptococcus agalactiae NOT DETECTED NOT DETECTED  ? Streptococcus pneumoniae NOT DETECTED NOT DETECTED  ? Streptococcus pyogenes NOT DETECTED NOT DETECTED  ? A.calcoaceticus-baumannii NOT DETECTED NOT DETECTED  ? Bacteroides fragilis NOT DETECTED NOT DETECTED  ? Enterobacterales NOT DETECTED NOT DETECTED  ? Enterobacter cloacae complex NOT DETECTED NOT DETECTED  ? Escherichia coli NOT DETECTED NOT DETECTED  ? Klebsiella aerogenes NOT DETECTED NOT DETECTED  ? Klebsiella oxytoca NOT DETECTED NOT DETECTED  ? Klebsiella pneumoniae NOT DETECTED NOT DETECTED  ? Proteus  species NOT DETECTED NOT DETECTED  ? Salmonella species NOT DETECTED NOT DETECTED  ? Serratia marcescens NOT DETECTED NOT DETECTED  ? Haemophilus influenzae NOT DETECTED NOT DETECTED  ? Neisseria meningitidis NOT DETECTED NOT DETECTED  ? Pseudomonas aeruginosa NOT DETECTED NOT DETECTED  ? Stenotrophomonas maltophilia NOT DETECTED NOT DETECTED  ? Candida albicans NOT DETECTED NOT DETECTED  ? Candida auris NOT DETECTED NOT DETECTED  ? Candida glabrata NOT DETECTED NOT DETECTED  ? Candida krusei NOT DETECTED NOT DETECTED  ? Candida parapsilosis NOT DETECTED NOT DETECTED  ? Candida tropicalis NOT DETECTED NOT DETECTED  ? Cryptococcus neoformans/gattii NOT DETECTED NOT DETECTED  ? Methicillin resistance mecA/C NOT DETECTED NOT DETECTED  ? ? ?Everette Rank, PharmD ?05/04/2021  3:23 AM ? ?

## 2021-05-05 DIAGNOSIS — R509 Fever, unspecified: Secondary | ICD-10-CM

## 2021-05-05 DIAGNOSIS — D5700 Hb-SS disease with crisis, unspecified: Secondary | ICD-10-CM | POA: Diagnosis not present

## 2021-05-05 LAB — CULTURE, BLOOD (ROUTINE X 2)

## 2021-05-05 LAB — HEMOGLOBIN AND HEMATOCRIT, BLOOD
HCT: 21.5 % — ABNORMAL LOW (ref 36.0–46.0)
Hemoglobin: 7.6 g/dL — ABNORMAL LOW (ref 12.0–15.0)

## 2021-05-05 NOTE — TOC Progression Note (Signed)
Transition of Care (TOC) - Progression Note  ? ? ?Patient Details  ?Name: Alicia Villegas ?MRN: [id number] ?Date of Birth: 04-15-2000 ? ?Transition of Care (TOC) CM/SW Contact  ?Purcell Mouton, RN ?Phone Number: ?05/05/2021, 2:29 PM ? ?Clinical Narrative:    ? ?Pt present time there are no discharge need. Pt from home with relatives.  ? ?Expected Discharge Plan: Home/Self Care ?Barriers to Discharge: No Barriers Identified ? ?Expected Discharge Plan and Services ?Expected Discharge Plan: Home/Self Care ?  ?  ?  ?Living arrangements for the past 2 months: [place] ?                ?  ?  ?  ?  ?  ?  ?  ?  ?  ?  ? ? ?Social Determinants of Health (SDOH) Interventions ?  ? ?Readmission Risk Interventions ?   ? View : No data to display.  ?  ?  ?  ? ? ?

## 2021-05-05 NOTE — Progress Notes (Addendum)
Subjective: ?Alicia Villegas is a 21 year old female with a medical history significant for sickle cell disease that was admitted for sickle cell pain crisis. ?Today, patient continues to complain of severe pain primarily to upper and lower extremities.  Patient says that pain intensity has improved minimally overnight.  She rates her pain as 7/10.  She denies any dizziness, headache, shortness of breath, urinary symptoms, nausea, vomiting, or diarrhea. ? ?Objective: ? ?Vital signs in last 24 hours: ? ?Vitals:  ? 05/05/21 0830 05/05/21 0841 05/05/21 1015 05/05/21 1054  ?BP:  112/66 109/70 100/60  ?Pulse:  90  94  ?Resp: '18 20 19 16  '$ ?Temp:  98.6 ?F (37 ?C)  98.9 ?F (37.2 ?C)  ?TempSrc:  Oral  Oral  ?SpO2:  98%  99%  ?Weight:      ?Height:      ? ? ?Intake/Output from previous day: ? ? ?Intake/Output Summary (Last 24 hours) at 05/05/2021 1133 ?Last data filed at 05/05/2021 [id number] ?Gross per 24 hour  ?Intake 1350 ml  ?Output --  ?Net 1350 ml  ? ? ?Physical Exam: ?General: Alert, awake, oriented x3, in no acute distress.  ?HEENT: [place]/AT PEERL, EOMI ?Neck: Trachea midline,  no masses, no thyromegal,y no JVD, no carotid bruit ?OROPHARYNX:  Moist, No exudate/ erythema/lesions.  ?Heart: Regular rate and rhythm, without murmurs, rubs, gallops, PMI non-displaced, no heaves or thrills on palpation.  ?Lungs: Clear to auscultation, no wheezing or rhonchi noted. No increased vocal fremitus resonant to percussion  ?Abdomen: Soft, nontender, nondistended, positive bowel sounds, no masses no hepatosplenomegaly noted.Marland Kitchen  ?Neuro: No focal neurological deficits noted cranial nerves II through XII grossly intact. DTRs 2+ bilaterally upper and lower extremities. Strength 5 out of 5 in bilateral upper and lower extremities. ?Musculoskeletal: No warm swelling or erythema around joints, no spinal tenderness noted. ?Psychiatric: Patient alert and oriented x3, good insight and cognition, good recent to remote recall. ?Lymph node survey: No cervical  axillary or inguinal lymphadenopathy noted. ? ?Lab Results: ? ?Basic Metabolic Panel: ?   ?Component Value Date/Time  ? NA 134 (L) 05/04/2021 0909  ? NA 139 11/08/2020 1100  ? K 3.3 (L) 05/04/2021 0909  ? CL 109 05/04/2021 0909  ? CO2 19 (L) 05/04/2021 0909  ? BUN 6 05/04/2021 0909  ? BUN 5 (L) 11/08/2020 1100  ? CREATININE <0.30 (L) 05/04/2021 0909  ? CREATININE 0.30 (L) 05/02/2015 1442  ? GLUCOSE 88 05/04/2021 0909  ? CALCIUM 8.4 (L) 05/04/2021 0909  ? ?CBC: ?   ?Component Value Date/Time  ? WBC 10.3 05/04/2021 0909  ? HGB 6.4 (LL) 05/04/2021 0909  ? HGB 7.7 (L) 11/08/2020 1100  ? HCT 18.2 (L) 05/04/2021 0909  ? HCT 23.2 (L) 11/08/2020 1100  ? PLT 340 05/04/2021 0909  ? PLT 665 (H) 11/08/2020 1100  ? MCV 92.9 05/04/2021 0909  ? MCV 91 11/08/2020 1100  ? NEUTROABS 12.2 (H) 05/03/2021 0243  ? NEUTROABS 4.6 11/08/2020 1100  ? LYMPHSABS 2.0 05/03/2021 0243  ? LYMPHSABS 3.0 11/08/2020 1100  ? MONOABS 1.4 (H) 05/03/2021 0243  ? EOSABS 0.0 05/03/2021 0243  ? EOSABS 0.1 11/08/2020 1100  ? BASOSABS 0.1 05/03/2021 0243  ? BASOSABS 0.1 11/08/2020 1100  ? ? ?Recent Results (from the past 240 hour(s))  ?Resp Panel by RT-PCR (Flu A&B, Covid) Nasopharyngeal Swab     Status: None  ? Collection Time: 04/27/21  4:04 PM  ? Specimen: Nasopharyngeal Swab; Nasopharyngeal(NP) swabs in vial transport medium  ?Result Value Ref Range Status  ?  SARS Coronavirus 2 by RT PCR NEGATIVE NEGATIVE Final  ?  Comment: (NOTE) ?SARS-CoV-2 target nucleic acids are NOT DETECTED. ? ?The SARS-CoV-2 RNA is generally detectable in upper respiratory ?specimens during the acute phase of infection. The lowest ?concentration of SARS-CoV-2 viral copies this assay can detect is ?138 copies/mL. A negative result does not preclude SARS-Cov-2 ?infection and should not be used as the sole basis for treatment or ?other patient management decisions. A negative result may occur with  ?improper specimen collection/handling, submission of specimen other ?than  nasopharyngeal swab, presence of viral mutation(s) within the ?areas targeted by this assay, and inadequate number of viral ?copies(<138 copies/mL). A negative result must be combined with ?clinical observations, patient history, and epidemiological ?information. The expected result is Negative. ? ?Fact Sheet for Patients:  ?EntrepreneurPulse.com.au ? ?Fact Sheet for Healthcare Providers:  ?IncredibleEmployment.be ? ?This test is no t yet approved or cleared by the [place] FDA and  ?has been authorized for detection and/or diagnosis of SARS-CoV-2 by ?FDA under an Emergency Use Authorization (EUA). This EUA will remain  ?in effect (meaning this test can be used) for the duration of the ?COVID-19 declaration under Section 564(b)(1) of the Act, 21 ?U.S.C.section 360bbb-3(b)(1), unless the authorization is terminated  ?or revoked sooner.  ? ? ?  ? Influenza A by PCR NEGATIVE NEGATIVE Final  ? Influenza B by PCR NEGATIVE NEGATIVE Final  ?  Comment: (NOTE) ?The Xpert Xpress SARS-CoV-2/FLU/RSV plus assay is intended as an aid ?in the diagnosis of influenza from Nasopharyngeal swab specimens and ?should not be used as a sole basis for treatment. Nasal washings and ?aspirates are unacceptable for Xpert Xpress SARS-CoV-2/FLU/RSV ?testing. ? ?Fact Sheet for Patients: ?EntrepreneurPulse.com.au ? ?Fact Sheet for Healthcare Providers: ?IncredibleEmployment.be ? ?This test is not yet approved or cleared by the [place] FDA and ?has been authorized for detection and/or diagnosis of SARS-CoV-2 by ?FDA under an Emergency Use Authorization (EUA). This EUA will remain ?in effect (meaning this test can be used) for the duration of the ?COVID-19 declaration under Section 564(b)(1) of the Act, 21 U.S.C. ?section 360bbb-3(b)(1), unless the authorization is terminated or ?revoked. ? ?Performed at [facility], [address] ?[place], [place] [address] ?  ?Blood culture (routine x 2)     Status: Abnormal  ? Collection Time: 05/03/21  5:29 AM  ? Specimen: BLOOD RIGHT HAND  ?Result Value Ref Range Status  ? Specimen Description   Final  ?  BLOOD RIGHT HAND ?Performed at [facility], [place] [address] [place], [place] [address] ?  ? Special Requests   Final  ?  BOTTLES DRAWN AEROBIC ONLY Blood Culture results may not be optimal due to an inadequate volume of blood received in culture bottles ?Performed at [facility], [place] [address], [place], [place] [address] ?  ? Culture  Setup Time   Final  ?  GRAM POSITIVE COCCI ?IN PEDIATRIC BOTTLE ?CRITICAL RESULT CALLED TO, READ BACK BY AND VERIFIED WITH: L POINDEXTER,PHARMD'@0250'$  05/04/21 [place] ?  ? Culture (A)  Final  ?  STAPHYLOCOCCUS WARNERI ?STAPHYLOCOCCUS EPIDERMIDIS ?THE SIGNIFICANCE OF ISOLATING THIS ORGANISM FROM A SINGLE SET OF BLOOD CULTURES WHEN MULTIPLE SETS ARE DRAWN IS UNCERTAIN. PLEASE NOTIFY THE MICROBIOLOGY DEPARTMENT WITHIN ONE WEEK IF SPECIATION AND SENSITIVITIES ARE REQUIRED. ?Performed at [facility], [place] [address], [place], [place] [address] ?  ? Report Status 05/05/2021 FINAL  Final  ?Blood Culture ID Panel (Reflexed)  Status: Abnormal  ? Collection Time: 05/03/21  5:29 AM  ?Result Value Ref Range Status  ? Enterococcus faecalis NOT DETECTED NOT DETECTED Final  ? Enterococcus Faecium NOT DETECTED NOT DETECTED Final  ? Listeria monocytogenes NOT DETECTED NOT DETECTED Final  ? Staphylococcus species DETECTED (A) NOT DETECTED Final  ?  Comment: CRITICAL RESULT CALLED TO, READ BACK BY AND VERIFIED WITH: ?L POINDEXTER,PHARMD'@0250'$  05/04/21 [place] ?  ? Staphylococcus aureus (BCID) NOT DETECTED NOT DETECTED Final  ? Staphylococcus epidermidis DETECTED (A) NOT DETECTED Final  ?  Comment: CRITICAL RESULT CALLED TO, READ BACK BY AND VERIFIED WITH: ?L POINDEXTER,PHARMD'@0250'$  05/04/21 [place] ?  ? Staphylococcus lugdunensis NOT DETECTED NOT DETECTED  Final  ? Streptococcus species NOT DETECTED NOT DETECTED Final  ? Streptococcus agalactiae NOT DETECTED NOT DETECTED Final  ? Streptococcus pneumoniae NOT DETECTED NOT DETECTED Final  ? Streptococcus pyogenes NOT DETECTED NOT DETECTED

## 2021-05-06 DIAGNOSIS — R0902 Hypoxemia: Secondary | ICD-10-CM | POA: Diagnosis not present

## 2021-05-06 DIAGNOSIS — D5701 Hb-SS disease with acute chest syndrome: Secondary | ICD-10-CM | POA: Diagnosis not present

## 2021-05-06 DIAGNOSIS — D638 Anemia in other chronic diseases classified elsewhere: Secondary | ICD-10-CM | POA: Diagnosis not present

## 2021-05-06 DIAGNOSIS — Z79899 Other long term (current) drug therapy: Secondary | ICD-10-CM | POA: Diagnosis not present

## 2021-05-06 DIAGNOSIS — R509 Fever, unspecified: Secondary | ICD-10-CM | POA: Diagnosis not present

## 2021-05-06 DIAGNOSIS — E876 Hypokalemia: Secondary | ICD-10-CM | POA: Diagnosis not present

## 2021-05-06 DIAGNOSIS — D72829 Elevated white blood cell count, unspecified: Secondary | ICD-10-CM | POA: Diagnosis not present

## 2021-05-06 DIAGNOSIS — G894 Chronic pain syndrome: Secondary | ICD-10-CM | POA: Diagnosis not present

## 2021-05-06 DIAGNOSIS — Z20822 Contact with and (suspected) exposure to covid-19: Secondary | ICD-10-CM | POA: Diagnosis not present

## 2021-05-06 DIAGNOSIS — D5700 Hb-SS disease with crisis, unspecified: Secondary | ICD-10-CM | POA: Diagnosis not present

## 2021-05-06 LAB — CBC
HCT: 20.5 % — ABNORMAL LOW (ref 36.0–46.0)
Hemoglobin: 7.2 g/dL — ABNORMAL LOW (ref 12.0–15.0)
MCH: 32 pg (ref 26.0–34.0)
MCHC: 35.1 g/dL (ref 30.0–36.0)
MCV: 91.1 fL (ref 80.0–100.0)
Platelets: 384 10*3/uL (ref 150–400)
RBC: 2.25 MIL/uL — ABNORMAL LOW (ref 3.87–5.11)
RDW: 22.5 % — ABNORMAL HIGH (ref 11.5–15.5)
WBC: 10.8 10*3/uL — ABNORMAL HIGH (ref 4.0–10.5)
nRBC: 14.8 % — ABNORMAL HIGH (ref 0.0–0.2)

## 2021-05-06 LAB — BASIC METABOLIC PANEL
Anion gap: 6 (ref 5–15)
BUN: 7 mg/dL (ref 6–20)
CO2: 24 mmol/L (ref 22–32)
Calcium: 8.6 mg/dL — ABNORMAL LOW (ref 8.9–10.3)
Chloride: 108 mmol/L (ref 98–111)
Creatinine, Ser: 0.33 mg/dL — ABNORMAL LOW (ref 0.44–1.00)
GFR, Estimated: >60 mL/min (ref >60)
Glucose, Bld: 92 mg/dL (ref 70–99)
Potassium: 2.9 mmol/L — ABNORMAL LOW (ref 3.5–5.1)
Sodium: 138 mmol/L (ref 135–145)

## 2021-05-06 MED ORDER — SODIUM CHLORIDE 0.9 % IV BOLUS
500.0000 mL | Freq: Once | INTRAVENOUS | Status: AC
Start: 2021-05-06 — End: 2021-05-06
  Administered 2021-05-06: 500 mL via INTRAVENOUS

## 2021-05-06 MED ORDER — POTASSIUM CHLORIDE CRYS ER 20 MEQ PO TBCR
40.0000 meq | EXTENDED_RELEASE_TABLET | Freq: Every day | ORAL | Status: DC
  Administered 2021-05-06 – 2021-05-10 (×5): 40 meq via ORAL
  Filled 2021-05-06 (×7): qty 2

## 2021-05-06 NOTE — Progress Notes (Signed)
Patient's home meds of Hydroxyurea and Oxbryta sent to pharmacy for regimen dispensing by student nurse and her instructor.Eulas Post, RN  ?

## 2021-05-06 NOTE — Progress Notes (Signed)
Patient ID: Alicia Villegas, female   DOB: 2000-02-17, 21 y.o.   MRN: [id number] ?Subjective: ?Alicia Villegas is a 21 year old female with a medical history significant for sickle cell disease that was admitted for sickle cell pain crisis. ? ?Patient says she is having significant pain in her right leg but the pain in her left leg is much better today.  She is still having significant pain in her upper extremities as well.  She rates her pain at 9/10 this morning.  She denies any fever, cough, chest pain, shortness of breath, nausea, vomiting or diarrhea.  No urinary symptoms. ? ?Objective: ? ?Vital signs in last 24 hours: ? ?Vitals:  ? 05/06/21 0800 05/06/21 0945 05/06/21 1200 05/06/21 1343  ?BP:  109/65  123/66  ?Pulse:  99  (!) 110  ?Resp: '15  20 18  '$ ?Temp:  99.3 ?F (37.4 ?C)  98.9 ?F (37.2 ?C)  ?TempSrc:  Oral  Oral  ?SpO2: 97% 98% 97% 95%  ?Weight:      ?Height:      ? ? ?Intake/Output from previous day: ? ?No intake or output data in the 24 hours ending 05/06/21 1423 ? ?Physical Exam: ?General: Alert, awake, oriented x3, in no acute distress.  ?HEENT: [place]/AT PEERL, EOMI ?Neck: Trachea midline,  no masses, no thyromegal,y no JVD, no carotid bruit ?OROPHARYNX:  Moist, No exudate/ erythema/lesions.  ?Heart: Regular rate and rhythm, without murmurs, rubs, gallops, PMI non-displaced, no heaves or thrills on palpation.  ?Lungs: Clear to auscultation, no wheezing or rhonchi noted. No increased vocal fremitus resonant to percussion  ?Abdomen: Soft, nontender, nondistended, positive bowel sounds, no masses no hepatosplenomegaly noted.Marland Kitchen  ?Neuro: No focal neurological deficits noted cranial nerves II through XII grossly intact. DTRs 2+ bilaterally upper and lower extremities. Strength 5 out of 5 in bilateral upper and lower extremities. ?Musculoskeletal: No warm swelling or erythema around joints, no spinal tenderness noted. ?Psychiatric: Patient alert and oriented x3, good insight and cognition, good recent to remote  recall. ?Lymph node survey: No cervical axillary or inguinal lymphadenopathy noted. ? ?Lab Results: ? ?Basic Metabolic Panel: ?   ?Component Value Date/Time  ? NA 138 05/06/2021 0347  ? NA 139 11/08/2020 1100  ? K 2.9 (L) 05/06/2021 0347  ? CL 108 05/06/2021 0347  ? CO2 24 05/06/2021 0347  ? BUN 7 05/06/2021 0347  ? BUN 5 (L) 11/08/2020 1100  ? CREATININE 0.33 (L) 05/06/2021 0347  ? CREATININE 0.30 (L) 05/02/2015 1442  ? GLUCOSE 92 05/06/2021 0347  ? CALCIUM 8.6 (L) 05/06/2021 0347  ? ?CBC: ?   ?Component Value Date/Time  ? WBC 10.8 (H) 05/06/2021 0347  ? HGB 7.2 (L) 05/06/2021 0347  ? HGB 7.7 (L) 11/08/2020 1100  ? HCT 20.5 (L) 05/06/2021 0347  ? HCT 23.2 (L) 11/08/2020 1100  ? PLT 384 05/06/2021 0347  ? PLT 665 (H) 11/08/2020 1100  ? MCV 91.1 05/06/2021 0347  ? MCV 91 11/08/2020 1100  ? NEUTROABS 12.2 (H) 05/03/2021 0243  ? NEUTROABS 4.6 11/08/2020 1100  ? LYMPHSABS 2.0 05/03/2021 0243  ? LYMPHSABS 3.0 11/08/2020 1100  ? MONOABS 1.4 (H) 05/03/2021 0243  ? EOSABS 0.0 05/03/2021 0243  ? EOSABS 0.1 11/08/2020 1100  ? BASOSABS 0.1 05/03/2021 0243  ? BASOSABS 0.1 11/08/2020 1100  ? ? ?Recent Results (from the past 240 hour(s))  ?Resp Panel by RT-PCR (Flu A&B, Covid) Nasopharyngeal Swab     Status: None  ? Collection Time: 04/27/21  4:04 PM  ? Specimen: Nasopharyngeal  Swab; Nasopharyngeal(NP) swabs in vial transport medium  ?Result Value Ref Range Status  ? SARS Coronavirus 2 by RT PCR NEGATIVE NEGATIVE Final  ?  Comment: (NOTE) ?SARS-CoV-2 target nucleic acids are NOT DETECTED. ? ?The SARS-CoV-2 RNA is generally detectable in upper respiratory ?specimens during the acute phase of infection. The lowest ?concentration of SARS-CoV-2 viral copies this assay can detect is ?138 copies/mL. A negative result does not preclude SARS-Cov-2 ?infection and should not be used as the sole basis for treatment or ?other patient management decisions. A negative result may occur with  ?improper specimen collection/handling, submission of  specimen other ?than nasopharyngeal swab, presence of viral mutation(s) within the ?areas targeted by this assay, and inadequate number of viral ?copies(<138 copies/mL). A negative result must be combined with ?clinical observations, patient history, and epidemiological ?information. The expected result is Negative. ? ?Fact Sheet for Patients:  ?EntrepreneurPulse.com.au ? ?Fact Sheet for Healthcare Providers:  ?IncredibleEmployment.be ? ?This test is no t yet approved or cleared by the [place] FDA and  ?has been authorized for detection and/or diagnosis of SARS-CoV-2 by ?FDA under an Emergency Use Authorization (EUA). This EUA will remain  ?in effect (meaning this test can be used) for the duration of the ?COVID-19 declaration under Section 564(b)(1) of the Act, 21 ?U.S.C.section 360bbb-3(b)(1), unless the authorization is terminated  ?or revoked sooner.  ? ? ?  ? Influenza A by PCR NEGATIVE NEGATIVE Final  ? Influenza B by PCR NEGATIVE NEGATIVE Final  ?  Comment: (NOTE) ?The Xpert Xpress SARS-CoV-2/FLU/RSV plus assay is intended as an aid ?in the diagnosis of influenza from Nasopharyngeal swab specimens and ?should not be used as a sole basis for treatment. Nasal washings and ?aspirates are unacceptable for Xpert Xpress SARS-CoV-2/FLU/RSV ?testing. ? ?Fact Sheet for Patients: ?EntrepreneurPulse.com.au ? ?Fact Sheet for Healthcare Providers: ?IncredibleEmployment.be ? ?This test is not yet approved or cleared by the [place] FDA and ?has been authorized for detection and/or diagnosis of SARS-CoV-2 by ?FDA under an Emergency Use Authorization (EUA). This EUA will remain ?in effect (meaning this test can be used) for the duration of the ?COVID-19 declaration under Section 564(b)(1) of the Act, 21 U.S.C. ?section 360bbb-3(b)(1), unless the authorization is terminated or ?revoked. ? ?Performed at [facility], [place]  [address] ?[place], [place] [address] ?  ?Blood culture (routine x 2)     Status: Abnormal  ? Collection Time: 05/03/21  5:29 AM  ? Specimen: BLOOD RIGHT HAND  ?Result Value Ref Range Status  ? Specimen Description   Final  ?  BLOOD RIGHT HAND ?Performed at [facility], [place] [address], [place], [place] [address] ?  ? Special Requests   Final  ?  BOTTLES DRAWN AEROBIC ONLY Blood Culture results may not be optimal due to an inadequate volume of blood received in culture bottles ?Performed at [facility] [place] [address], [place], [place] [address] ?  ? Culture  Setup Time   Final  ?  GRAM POSITIVE COCCI ?IN PEDIATRIC BOTTLE ?CRITICAL RESULT CALLED TO, READ BACK BY AND VERIFIED WITH: L POINDEXTER,PHARMD'@0250'$  05/04/21 [place] ?  ? Culture (A)  Final  ?  STAPHYLOCOCCUS WARNERI ?STAPHYLOCOCCUS EPIDERMIDIS ?THE SIGNIFICANCE OF ISOLATING THIS ORGANISM FROM A SINGLE SET OF BLOOD CULTURES WHEN MULTIPLE SETS ARE DRAWN IS UNCERTAIN. PLEASE NOTIFY THE MICROBIOLOGY DEPARTMENT WITHIN ONE WEEK IF SPECIATION AND SENSITIVITIES ARE REQUIRED. ?Performed at [facility], [place] [address] [place], [place] [address] ?  ?  Report Status 05/05/2021 FINAL  Final  ?Blood Culture ID Panel (Reflexed)     Status: Abnormal  ? Collection Time: 05/03/21  5:29 AM  ?Result Value Ref Range Status  ? Enterococcus faecalis NOT DETECTED NOT DETECTED Final  ? Enterococcus Faecium NOT DETECTED NOT DETECTED Final  ? Listeria monocytogenes NOT DETECTED NOT DETECTED Final  ? Staphylococcus species DETECTED (A) NOT DETECTED Final  ?  Comment: CRITICAL RESULT CALLED TO, READ BACK BY AND VERIFIED WITH: ?L POINDEXTER,PHARMD'@0250'$  05/04/21 [place] ?  ? Staphylococcus aureus (BCID) NOT DETECTED NOT DETECTED Final  ? Staphylococcus epidermidis DETECTED (A) NOT DETECTED Final  ?  Comment: CRITICAL RESULT CALLED TO, READ BACK BY AND VERIFIED WITH: ?L POINDEXTER,PHARMD'@0250'$  05/04/21 [place] ?  ? Staphylococcus lugdunensis NOT DETECTED NOT  DETECTED Final  ? Streptococcus species NOT DETECTED NOT DETECTED Final  ? Streptococcus agalactiae NOT DETECTED NOT DETECTED Final  ? Streptococcus pneumoniae NOT DETECTED NOT DETECTED Final  ? Streptococcus pyogenes

## 2021-05-06 NOTE — Progress Notes (Signed)
RN has been continuously reminding pt to use PCA pump. Patient's pain has remained around 6-7/10. Has refused po prn med. Continue to monitor.Eulas Post, RN  ?

## 2021-05-06 NOTE — Plan of Care (Signed)
?  Problem: Education: ?Goal: Knowledge of vaso-occlusive preventative measures will improve ?Outcome: Progressing ?Goal: Awareness of infection prevention will improve ?Outcome: Progressing ?Goal: Awareness of signs and symptoms of anemia will improve ?Outcome: Progressing ?Goal: Long-term complications will improve ?Outcome: Progressing ?  ?Problem: Self-Care: ?Goal: Ability to incorporate actions that prevent/reduce pain crisis will improve ?Outcome: Progressing ?  ?

## 2021-05-06 NOTE — Progress Notes (Signed)
Patient complains of intermittent chest pain. EKG obtained. On call provider notified of patient condition and EKG. New order received. RN will continue to monitor. ?

## 2021-05-07 DIAGNOSIS — R509 Fever, unspecified: Secondary | ICD-10-CM | POA: Diagnosis not present

## 2021-05-07 DIAGNOSIS — D72829 Elevated white blood cell count, unspecified: Secondary | ICD-10-CM | POA: Diagnosis not present

## 2021-05-07 DIAGNOSIS — D5701 Hb-SS disease with acute chest syndrome: Secondary | ICD-10-CM | POA: Diagnosis not present

## 2021-05-07 DIAGNOSIS — D5700 Hb-SS disease with crisis, unspecified: Secondary | ICD-10-CM | POA: Diagnosis not present

## 2021-05-07 LAB — COMPREHENSIVE METABOLIC PANEL
ALT: 22 U/L (ref 0–44)
AST: 30 U/L (ref 15–41)
Albumin: 3.1 g/dL — ABNORMAL LOW (ref 3.5–5.0)
Alkaline Phosphatase: 120 U/L (ref 38–126)
Anion gap: 8 (ref 5–15)
BUN: 6 mg/dL (ref 6–20)
CO2: 23 mmol/L (ref 22–32)
Calcium: 8.4 mg/dL — ABNORMAL LOW (ref 8.9–10.3)
Chloride: 108 mmol/L (ref 98–111)
Creatinine, Ser: 0.38 mg/dL — ABNORMAL LOW (ref 0.44–1.00)
GFR, Estimated: >60 mL/min (ref >60)
Glucose, Bld: 86 mg/dL (ref 70–99)
Potassium: 2.9 mmol/L — ABNORMAL LOW (ref 3.5–5.1)
Sodium: 139 mmol/L (ref 135–145)
Total Bilirubin: 2.7 mg/dL — ABNORMAL HIGH (ref 0.3–1.2)
Total Protein: 6.4 g/dL — ABNORMAL LOW (ref 6.5–8.1)

## 2021-05-07 LAB — CBC WITH DIFFERENTIAL/PLATELET
Abs Immature Granulocytes: 0.12 10*3/uL — ABNORMAL HIGH (ref 0.00–0.07)
Basophils Absolute: 0 10*3/uL (ref 0.0–0.1)
Basophils Relative: 0 %
Eosinophils Absolute: 0.3 10*3/uL (ref 0.0–0.5)
Eosinophils Relative: 3 %
HCT: 17.6 % — ABNORMAL LOW (ref 36.0–46.0)
Hemoglobin: 6.2 g/dL — CL (ref 12.0–15.0)
Immature Granulocytes: 1 %
Lymphocytes Relative: 38 %
Lymphs Abs: 3.6 10*3/uL (ref 0.7–4.0)
MCH: 33.5 pg (ref 26.0–34.0)
MCHC: 35.2 g/dL (ref 30.0–36.0)
MCV: 95.1 fL (ref 80.0–100.0)
Monocytes Absolute: 0.8 10*3/uL (ref 0.1–1.0)
Monocytes Relative: 9 %
Neutro Abs: 4.6 10*3/uL (ref 1.7–7.7)
Neutrophils Relative %: 49 %
Platelet Morphology: NORMAL
Platelets: 387 10*3/uL (ref 150–400)
RBC: 1.85 MIL/uL — ABNORMAL LOW (ref 3.87–5.11)
RDW: 24.3 % — ABNORMAL HIGH (ref 11.5–15.5)
WBC: 9.4 10*3/uL (ref 4.0–10.5)
nRBC: 34.4 % — ABNORMAL HIGH (ref 0.0–0.2)

## 2021-05-07 LAB — PREPARE RBC (CROSSMATCH)

## 2021-05-07 MED ORDER — SODIUM CHLORIDE 0.9% IV SOLUTION: Freq: Once | INTRAVENOUS | Status: AC

## 2021-05-07 MED ORDER — HYDROCORTISONE 1 % EX CREA
TOPICAL_CREAM | Freq: Three times a day (TID) | CUTANEOUS | Status: DC | PRN
  Filled 2021-05-07: qty 28

## 2021-05-07 MED ORDER — POTASSIUM CHLORIDE 10 MEQ/100ML IV SOLN
10.0000 meq | INTRAVENOUS | Status: AC
  Administered 2021-05-07 (×2): 10 meq via INTRAVENOUS
  Filled 2021-05-07 (×2): qty 100

## 2021-05-07 MED ORDER — LACTASE 3000 UNITS PO TABS
6000.0000 [IU] | ORAL_TABLET | Freq: Three times a day (TID) | ORAL | Status: DC | PRN
  Administered 2021-05-07 – 2021-05-09 (×2): 6000 [IU] via ORAL
  Filled 2021-05-07 (×3): qty 2

## 2021-05-07 NOTE — Progress Notes (Signed)
Patient refused 0400 labs and asked technician if she would return later. Lab tech came back around 0630 and the patient then requested that labs be drawn via finger stick. Actor. It was explained to patient that labs could not be obtained via fingerstick and she was okay with that. RN went back to let technician know that she could draw the labs and the technician had left the department. ?

## 2021-05-07 NOTE — Progress Notes (Signed)
Patient ID: Alicia Villegas, female   DOB: 04/15/2000, 21 y.o.   MRN: [id number] ?Subjective: ?Alicia Villegas is a 21 year old female with a medical history significant for sickle cell disease that was admitted for sickle cell pain crisis. ? ?Patient continues to complain of significant pain, this time in her left upper arm and left shoulder joint.  Pain in her legs, mostly resolved.  She looks unhappy this morning saying that the pain is taking too long to resolve.  She feels that the pain in her shoulder joint is a new crisis.  She denies any fever, cough, chest pain, shortness of breath, nausea, vomiting or diarrhea no urinary symptoms. ? ?Objective: ? ?Vital signs in last 24 hours: ? ?Vitals:  ? 05/07/21 1005 05/07/21 1110 05/07/21 1310 05/07/21 1536  ?BP: 119/71  108/64   ?Pulse: 93  85   ?Resp: '20 15 18 '$ (!) 21  ?Temp: 98.1 ?F (36.7 ?C)  98.3 ?F (36.8 ?C)   ?TempSrc: Oral  Oral   ?SpO2: 97%  98% 98%  ?Weight:      ?Height:      ? ? ?Intake/Output from previous day: ? ?No intake or output data in the 24 hours ending 05/07/21 1614 ? ?Physical Exam: ?General: Alert, awake, oriented x3, in no acute distress.  ?HEENT: [place]/AT PEERL, EOMI ?Neck: Trachea midline,  no masses, no thyromegal,y no JVD, no carotid bruit ?OROPHARYNX:  Moist, No exudate/ erythema/lesions.  ?Heart: Regular rate and rhythm, without murmurs, rubs, gallops, PMI non-displaced, no heaves or thrills on palpation.  ?Lungs: Clear to auscultation, no wheezing or rhonchi noted. No increased vocal fremitus resonant to percussion  ?Abdomen: Soft, nontender, nondistended, positive bowel sounds, no masses no hepatosplenomegaly noted.Marland Kitchen  ?Neuro: No focal neurological deficits noted cranial nerves II through XII grossly intact. DTRs 2+ bilaterally upper and lower extremities. Strength 5 out of 5 in bilateral upper and lower extremities. ?Musculoskeletal: No warm swelling or erythema around joints, no spinal tenderness noted. ?Psychiatric: Patient alert and oriented  x3, good insight and cognition, good recent to remote recall. ?Lymph node survey: No cervical axillary or inguinal lymphadenopathy noted. ? ?Lab Results: ? ?Basic Metabolic Panel: ?   ?Component Value Date/Time  ? NA 139 05/07/2021 0927  ? NA 139 11/08/2020 1100  ? K 2.9 (L) 05/07/2021 9629  ? CL 108 05/07/2021 0927  ? CO2 23 05/07/2021 0927  ? BUN 6 05/07/2021 0927  ? BUN 5 (L) 11/08/2020 1100  ? CREATININE 0.38 (L) 05/07/2021 5284  ? CREATININE 0.30 (L) 05/02/2015 1442  ? GLUCOSE 86 05/07/2021 0927  ? CALCIUM 8.4 (L) 05/07/2021 1324  ? ?CBC: ?   ?Component Value Date/Time  ? WBC 9.4 05/07/2021 0927  ? HGB 6.2 (LL) 05/07/2021 0927  ? HGB 7.7 (L) 11/08/2020 1100  ? HCT 17.6 (L) 05/07/2021 4010  ? HCT 23.2 (L) 11/08/2020 1100  ? PLT 387 05/07/2021 0927  ? PLT 665 (H) 11/08/2020 1100  ? MCV 95.1 05/07/2021 0927  ? MCV 91 11/08/2020 1100  ? NEUTROABS 4.6 05/07/2021 0927  ? NEUTROABS 4.6 11/08/2020 1100  ? LYMPHSABS 3.6 05/07/2021 0927  ? LYMPHSABS 3.0 11/08/2020 1100  ? MONOABS 0.8 05/07/2021 0927  ? EOSABS 0.3 05/07/2021 0927  ? EOSABS 0.1 11/08/2020 1100  ? BASOSABS 0.0 05/07/2021 0927  ? BASOSABS 0.1 11/08/2020 1100  ? ? ?Recent Results (from the past 240 hour(s))  ?Blood culture (routine x 2)     Status: Abnormal  ? Collection Time: 05/03/21  5:29 AM  ?  Specimen: BLOOD RIGHT HAND  ?Result Value Ref Range Status  ? Specimen Description   Final  ?  BLOOD RIGHT HAND ?Performed at [facility], [place] [address] [place], [place] [address] ?  ? Special Requests   Final  ?  BOTTLES DRAWN AEROBIC ONLY Blood Culture results may not be optimal due to an inadequate volume of blood received in culture bottles ?Performed at [facility], [place] [address] [place], [place] [address] ?  ? Culture  Setup Time   Final  ?  GRAM POSITIVE COCCI ?IN PEDIATRIC BOTTLE ?CRITICAL RESULT CALLED TO, READ BACK BY AND VERIFIED WITH: L POINDEXTER,PHARMD'@0250'$  05/04/21 [place] ?  ? Culture (A)  Final  ?   STAPHYLOCOCCUS WARNERI ?STAPHYLOCOCCUS EPIDERMIDIS ?THE SIGNIFICANCE OF ISOLATING THIS ORGANISM FROM A SINGLE SET OF BLOOD CULTURES WHEN MULTIPLE SETS ARE DRAWN IS UNCERTAIN. PLEASE NOTIFY THE MICROBIOLOGY DEPARTMENT WITHIN ONE WEEK IF SPECIATION AND SENSITIVITIES ARE REQUIRED. ?Performed at [facility], [place] [address] [place], [place] [address] ?  ? Report Status 05/05/2021 FINAL  Final  ?Blood Culture ID Panel (Reflexed)     Status: Abnormal  ? Collection Time: 05/03/21  5:29 AM  ?Result Value Ref Range Status  ? Enterococcus faecalis NOT DETECTED NOT DETECTED Final  ? Enterococcus Faecium NOT DETECTED NOT DETECTED Final  ? Listeria monocytogenes NOT DETECTED NOT DETECTED Final  ? Staphylococcus species DETECTED (A) NOT DETECTED Final  ?  Comment: CRITICAL RESULT CALLED TO, READ BACK BY AND VERIFIED WITH: ?L POINDEXTER,PHARMD'@0250'$  05/04/21 [place] ?  ? Staphylococcus aureus (BCID) NOT DETECTED NOT DETECTED Final  ? Staphylococcus epidermidis DETECTED (A) NOT DETECTED Final  ?  Comment: CRITICAL RESULT CALLED TO, READ BACK BY AND VERIFIED WITH: ?L POINDEXTER,PHARMD'@0250'$  05/04/21 [place] ?  ? Staphylococcus lugdunensis NOT DETECTED NOT DETECTED Final  ? Streptococcus species NOT DETECTED NOT DETECTED Final  ? Streptococcus agalactiae NOT DETECTED NOT DETECTED Final  ? Streptococcus pneumoniae NOT DETECTED NOT DETECTED Final  ? Streptococcus pyogenes NOT DETECTED NOT DETECTED Final  ? A.calcoaceticus-baumannii NOT DETECTED NOT DETECTED Final  ? Bacteroides fragilis NOT DETECTED NOT DETECTED Final  ? Enterobacterales NOT DETECTED NOT DETECTED Final  ? Enterobacter cloacae complex NOT DETECTED NOT DETECTED Final  ? Escherichia coli NOT DETECTED NOT DETECTED Final  ? Klebsiella aerogenes NOT DETECTED NOT DETECTED Final  ? Klebsiella oxytoca NOT DETECTED NOT DETECTED Final  ? Klebsiella pneumoniae NOT DETECTED NOT DETECTED Final  ? Proteus species NOT DETECTED NOT DETECTED Final  ? Salmonella species NOT DETECTED NOT  DETECTED Final  ? Serratia marcescens NOT DETECTED NOT DETECTED Final  ? Haemophilus influenzae NOT DETECTED NOT DETECTED Final  ? Neisseria meningitidis NOT DETECTED NOT DETECTED Final  ? Pseudomonas aeruginosa NOT DETECTED NOT DETECTED Final  ? Stenotrophomonas maltophilia NOT DETECTED NOT DETECTED Final  ? Candida albicans NOT DETECTED NOT DETECTED Final  ? Candida auris NOT DETECTED NOT DETECTED Final  ? Candida glabrata NOT DETECTED NOT DETECTED Final  ? Candida krusei NOT DETECTED NOT DETECTED Final  ? Candida parapsilosis NOT DETECTED NOT DETECTED Final  ? Candida tropicalis NOT DETECTED NOT DETECTED Final  ? Cryptococcus neoformans/gattii NOT DETECTED NOT DETECTED Final  ? Methicillin resistance mecA/C NOT DETECTED NOT DETECTED Final  ?  Comment: Performed at [facility], [address], [place], [place] [address]  ?Blood culture (routine x 2)     Status: None (Preliminary result)  ? Collection Time: 05/03/21  5:36 AM  ? Specimen:  BLOOD LEFT HAND  ?Result Value Ref Range Status  ? Specimen Description   Final  ?  BLOOD LEFT HAND ?Performed at [facility], [place] [address], [place], [place] [address] ?  ? Special Requests   Final  ?  BOTTLES DRAWN AEROBIC ONLY Blood Culture results may not be optimal due to an inadequate volume of blood received in culture bottles ?Performed at [facility], [place] [address] [place], [place] [address] ?  ? Culture   Final  ?  NO GROWTH 4 DAYS ?Performed at [facility], [place] [address] [place],  [address] ?  ? Report Status PENDING  Incomplete  ? ? ?Studies/Results: ?No results found. ? ?Medications: ?Scheduled Meds: ? sodium chloride   Intravenous Once  ? enoxaparin (LOVENOX) injection  40 mg Subcutaneous Q24H  ? hydroxyurea  1,200 mg Oral Daily  ? ketorolac  15 mg Intravenous Q6H  ? morphine   Intravenous Q4H  ? potassium chloride SA  40 mEq Oral Daily  ? propranolol  20 mg Oral Daily  ? senna-docusate  1 tablet Oral  BID  ? voxelotor  1,500 mg Oral Daily  ? ?Continuous Infusions: ?PRN Meds:.acetaminophen, diphenhydrAMINE, hydrocortisone cream, lactase, morphine, naloxone **AND** sodium chloride flush, ondansetron (ZOFRAN) IV, polyeth

## 2021-05-08 DIAGNOSIS — D5700 Hb-SS disease with crisis, unspecified: Secondary | ICD-10-CM | POA: Diagnosis not present

## 2021-05-08 LAB — TYPE AND SCREEN
ABO/RH(D): AB POS
Antibody Screen: POSITIVE
Unit division: 0
Unit division: 0
Unit division: 0

## 2021-05-08 LAB — CBC WITH DIFFERENTIAL/PLATELET
Abs Immature Granulocytes: 0.16 10*3/uL — ABNORMAL HIGH (ref 0.00–0.07)
Basophils Absolute: 0 10*3/uL (ref 0.0–0.1)
Basophils Relative: 0 %
Eosinophils Absolute: 0.2 10*3/uL (ref 0.0–0.5)
Eosinophils Relative: 2 %
HCT: 24.2 % — ABNORMAL LOW (ref 36.0–46.0)
Hemoglobin: 8.2 g/dL — ABNORMAL LOW (ref 12.0–15.0)
Immature Granulocytes: 2 %
Lymphocytes Relative: 27 %
Lymphs Abs: 2.7 10*3/uL (ref 0.7–4.0)
MCH: 32.5 pg (ref 26.0–34.0)
MCHC: 33.9 g/dL (ref 30.0–36.0)
MCV: 96 fL (ref 80.0–100.0)
Monocytes Absolute: 1 10*3/uL (ref 0.1–1.0)
Monocytes Relative: 10 %
Neutro Abs: 5.8 10*3/uL (ref 1.7–7.7)
Neutrophils Relative %: 59 %
Platelets: 385 10*3/uL (ref 150–400)
RBC: 2.52 MIL/uL — ABNORMAL LOW (ref 3.87–5.11)
RDW: 21 % — ABNORMAL HIGH (ref 11.5–15.5)
WBC: 10 10*3/uL (ref 4.0–10.5)
nRBC: 45 % — ABNORMAL HIGH (ref 0.0–0.2)

## 2021-05-08 LAB — BASIC METABOLIC PANEL
Anion gap: 8 (ref 5–15)
BUN: 5 mg/dL — ABNORMAL LOW (ref 6–20)
CO2: 20 mmol/L — ABNORMAL LOW (ref 22–32)
Calcium: 8.8 mg/dL — ABNORMAL LOW (ref 8.9–10.3)
Chloride: 108 mmol/L (ref 98–111)
Creatinine, Ser: <0.3 mg/dL — ABNORMAL LOW (ref 0.44–1.00)
Glucose, Bld: 88 mg/dL (ref 70–99)
Potassium: 3.8 mmol/L (ref 3.5–5.1)
Sodium: 136 mmol/L (ref 135–145)

## 2021-05-08 LAB — CULTURE, BLOOD (ROUTINE X 2): Culture: NO GROWTH

## 2021-05-08 LAB — BPAM RBC
Blood Product Expiration Date: 202304272359
Blood Product Expiration Date: 202305072359
Blood Product Expiration Date: 202305102359
ISSUE DATE / TIME: 202303310527
ISSUE DATE / TIME: 202304022357
Unit Type and Rh: 5100
Unit Type and Rh: 6200
Unit Type and Rh: 9500

## 2021-05-08 NOTE — Progress Notes (Signed)
Subjective: ?Alicia Villegas is a 21 year old female with a medical history significant for sickle cell disease that was admitted for sickle cell pain crisis. ?Today, patient continues to complain of pain primarily to upper and lower extremities. Patient says that pain is primarily to left shoulder. Pain intensity is 7/10. Patient is status post partial exchange transfusion on yesterday.  ? She denies any dizziness, headache, shortness of breath, urinary symptoms, nausea, vomiting, or diarrhea. ? ?Objective: ? ?Vital signs in last 24 hours: ? ?Vitals:  ? 05/08/21 1641 05/08/21 1800 05/08/21 2008 05/08/21 2204  ?BP:  100/60  118/76  ?Pulse:  69  76  ?Resp: '16 20 20 16  '$ ?Temp:  98.6 ?F (37 ?C)  98.2 ?F (36.8 ?C)  ?TempSrc:  Oral  Oral  ?SpO2: 100% 97% 96% 99%  ?Weight:      ?Height:      ? ? ?Intake/Output from previous day: ? ? ?Intake/Output Summary (Last 24 hours) at 05/08/2021 2216 ?Last data filed at 05/08/2021 1430 ?Gross per 24 hour  ?Intake 865.17 ml  ?Output 350 ml  ?Net 515.17 ml  ? ? ?Physical Exam: ?General: Alert, awake, oriented x3, in no acute distress.  ?HEENT: [place]/AT PEERL, EOMI ?Neck: Trachea midline,  no masses, no thyromegal,y no JVD, no carotid bruit ?OROPHARYNX:  Moist, No exudate/ erythema/lesions.  ?Heart: Regular rate and rhythm, without murmurs, rubs, gallops, PMI non-displaced, no heaves or thrills on palpation.  ?Lungs: Clear to auscultation, no wheezing or rhonchi noted. No increased vocal fremitus resonant to percussion  ?Abdomen: Soft, nontender, nondistended, positive bowel sounds, no masses no hepatosplenomegaly noted.Marland Kitchen  ?Neuro: No focal neurological deficits noted cranial nerves II through XII grossly intact. DTRs 2+ bilaterally upper and lower extremities. Strength 5 out of 5 in bilateral upper and lower extremities. ?Musculoskeletal: No warm swelling or erythema around joints, no spinal tenderness noted. ?Psychiatric: Patient alert and oriented x3, good insight and cognition, good recent to  remote recall. ?Lymph node survey: No cervical axillary or inguinal lymphadenopathy noted. ? ?Lab Results: ? ?Basic Metabolic Panel: ?   ?Component Value Date/Time  ? NA 136 05/08/2021 0523  ? NA 139 11/08/2020 1100  ? K 3.8 05/08/2021 0523  ? CL 108 05/08/2021 0523  ? CO2 20 (L) 05/08/2021 0523  ? BUN 5 (L) 05/08/2021 0523  ? BUN 5 (L) 11/08/2020 1100  ? CREATININE <0.30 (L) 05/08/2021 0523  ? CREATININE 0.30 (L) 05/02/2015 1442  ? GLUCOSE 88 05/08/2021 0523  ? CALCIUM 8.8 (L) 05/08/2021 0523  ? ?CBC: ?   ?Component Value Date/Time  ? WBC 10.0 05/08/2021 0523  ? HGB 8.2 (L) 05/08/2021 0523  ? HGB 7.7 (L) 11/08/2020 1100  ? HCT 24.2 (L) 05/08/2021 0523  ? HCT 23.2 (L) 11/08/2020 1100  ? PLT 385 05/08/2021 0523  ? PLT 665 (H) 11/08/2020 1100  ? MCV 96.0 05/08/2021 0523  ? MCV 91 11/08/2020 1100  ? NEUTROABS 5.8 05/08/2021 0523  ? NEUTROABS 4.6 11/08/2020 1100  ? LYMPHSABS 2.7 05/08/2021 0523  ? LYMPHSABS 3.0 11/08/2020 1100  ? MONOABS 1.0 05/08/2021 0523  ? EOSABS 0.2 05/08/2021 0523  ? EOSABS 0.1 11/08/2020 1100  ? BASOSABS 0.0 05/08/2021 0523  ? BASOSABS 0.1 11/08/2020 1100  ? ? ?Recent Results (from the past 240 hour(s))  ?Blood culture (routine x 2)     Status: Abnormal  ? Collection Time: 05/03/21  5:29 AM  ? Specimen: BLOOD RIGHT HAND  ?Result Value Ref Range Status  ? Specimen Description  Final  ?  BLOOD RIGHT HAND ?Performed at [facility], [place] [address], [place], [place] [address] ?  ? Special Requests   Final  ?  BOTTLES DRAWN AEROBIC ONLY Blood Culture results may not be optimal due to an inadequate volume of blood received in culture bottles ?Performed at [facility], [place] [address], [place], [place] [address] ?  ? Culture  Setup Time   Final  ?  GRAM POSITIVE COCCI ?IN PEDIATRIC BOTTLE ?CRITICAL RESULT CALLED TO, READ BACK BY AND VERIFIED WITH: L POINDEXTER,PHARMD'@0250'$  05/04/21 [place] ?  ? Culture (A)  Final  ?  STAPHYLOCOCCUS WARNERI ?STAPHYLOCOCCUS  EPIDERMIDIS ?THE SIGNIFICANCE OF ISOLATING THIS ORGANISM FROM A SINGLE SET OF BLOOD CULTURES WHEN MULTIPLE SETS ARE DRAWN IS UNCERTAIN. PLEASE NOTIFY THE MICROBIOLOGY DEPARTMENT WITHIN ONE WEEK IF SPECIATION AND SENSITIVITIES ARE REQUIRED. ?Performed at [facility], [place] [address] [place], [place] [address] ?  ? Report Status 05/05/2021 FINAL  Final  ?Blood Culture ID Panel (Reflexed)     Status: Abnormal  ? Collection Time: 05/03/21  5:29 AM  ?Result Value Ref Range Status  ? Enterococcus faecalis NOT DETECTED NOT DETECTED Final  ? Enterococcus Faecium NOT DETECTED NOT DETECTED Final  ? Listeria monocytogenes NOT DETECTED NOT DETECTED Final  ? Staphylococcus species DETECTED (A) NOT DETECTED Final  ?  Comment: CRITICAL RESULT CALLED TO, READ BACK BY AND VERIFIED WITH: ?L POINDEXTER,PHARMD'@0250'$  05/04/21 [place] ?  ? Staphylococcus aureus (BCID) NOT DETECTED NOT DETECTED Final  ? Staphylococcus epidermidis DETECTED (A) NOT DETECTED Final  ?  Comment: CRITICAL RESULT CALLED TO, READ BACK BY AND VERIFIED WITH: ?L POINDEXTER,PHARMD'@0250'$  05/04/21 [place] ?  ? Staphylococcus lugdunensis NOT DETECTED NOT DETECTED Final  ? Streptococcus species NOT DETECTED NOT DETECTED Final  ? Streptococcus agalactiae NOT DETECTED NOT DETECTED Final  ? Streptococcus pneumoniae NOT DETECTED NOT DETECTED Final  ? Streptococcus pyogenes NOT DETECTED NOT DETECTED Final  ? A.calcoaceticus-baumannii NOT DETECTED NOT DETECTED Final  ? Bacteroides fragilis NOT DETECTED NOT DETECTED Final  ? Enterobacterales NOT DETECTED NOT DETECTED Final  ? Enterobacter cloacae complex NOT DETECTED NOT DETECTED Final  ? Escherichia coli NOT DETECTED NOT DETECTED Final  ? Klebsiella aerogenes NOT DETECTED NOT DETECTED Final  ? Klebsiella oxytoca NOT DETECTED NOT DETECTED Final  ? Klebsiella pneumoniae NOT DETECTED NOT DETECTED Final  ? Proteus species NOT DETECTED NOT DETECTED Final  ? Salmonella species NOT DETECTED NOT DETECTED Final  ? Serratia marcescens NOT  DETECTED NOT DETECTED Final  ? Haemophilus influenzae NOT DETECTED NOT DETECTED Final  ? Neisseria meningitidis NOT DETECTED NOT DETECTED Final  ? Pseudomonas aeruginosa NOT DETECTED NOT DETECTED Final  ? Stenotrophomonas maltophilia NOT DETECTED NOT DETECTED Final  ? Candida albicans NOT DETECTED NOT DETECTED Final  ? Candida auris NOT DETECTED NOT DETECTED Final  ? Candida glabrata NOT DETECTED NOT DETECTED Final  ? Candida krusei NOT DETECTED NOT DETECTED Final  ? Candida parapsilosis NOT DETECTED NOT DETECTED Final  ? Candida tropicalis NOT DETECTED NOT DETECTED Final  ? Cryptococcus neoformans/gattii NOT DETECTED NOT DETECTED Final  ? Methicillin resistance mecA/C NOT DETECTED NOT DETECTED Final  ?  Comment: Performed at [facility], [address], [place], [place] [address]  ?Blood culture (routine x 2)     Status: None  ? Collection Time: 05/03/21  5:36 AM  ? Specimen: BLOOD LEFT HAND  ?Result Value Ref Range Status  ? Specimen Description   Final  ?  BLOOD LEFT HAND ?Performed at [facility], [place] [address], [place], [place] [address] ?  ? Special Requests   Final  ?  BOTTLES DRAWN AEROBIC ONLY Blood Culture results may not be optimal due to an inadequate volume of blood received in culture bottles ?Performed at [facility], [place] [address] [place], [place] [address] ?  ? Culture   Final  ?  NO GROWTH 5 DAYS ?Performed at [facility], [place] [address] [place], [place] [address] ?  ? Report Status 05/08/2021 FINAL  Final  ? ? ?Studies/Results: ?No results found. ? ?Medications: ?Scheduled Meds: ? enoxaparin (LOVENOX) injection  40 mg Subcutaneous Q24H  ? hydroxyurea  1,200 mg Oral Daily  ? ketorolac  15 mg Intravenous Q6H  ? morphine   Intravenous Q4H  ? potassium chloride SA  40 mEq Oral Daily  ? propranolol  20 mg Oral Daily  ? senna-docusate  1 tablet Oral BID  ? voxelotor  1,500 mg Oral Daily  ? ?Continuous Infusions: ?PRN Meds:.acetaminophen,  diphenhydrAMINE, hydrocortisone cream, lactase, morphine, naloxone **AND** sodium chloride flush, ondansetron (ZOFRAN) IV, polyethylene glycol ? ?Consultants: ?None ? ?Procedures: ?None ? ?Antibiotics: ?None ? ?Assessment/Pla

## 2021-05-09 ENCOUNTER — Ambulatory Visit: Payer: Medicaid Other | Admitting: Family Medicine

## 2021-05-09 DIAGNOSIS — D5700 Hb-SS disease with crisis, unspecified: Secondary | ICD-10-CM | POA: Diagnosis not present

## 2021-05-09 LAB — LACTATE DEHYDROGENASE: LDH: 1007 U/L — ABNORMAL HIGH (ref 98–192)

## 2021-05-09 LAB — BASIC METABOLIC PANEL
Anion gap: 8 (ref 5–15)
BUN: 9 mg/dL (ref 6–20)
CO2: 21 mmol/L — ABNORMAL LOW (ref 22–32)
Calcium: 8.5 mg/dL — ABNORMAL LOW (ref 8.9–10.3)
Chloride: 109 mmol/L (ref 98–111)
Creatinine, Ser: <0.3 mg/dL — ABNORMAL LOW (ref 0.44–1.00)
Glucose, Bld: 111 mg/dL — ABNORMAL HIGH (ref 70–99)
Potassium: 3.5 mmol/L (ref 3.5–5.1)
Sodium: 138 mmol/L (ref 135–145)

## 2021-05-09 LAB — HEPATIC FUNCTION PANEL
ALT: 21 U/L (ref 0–44)
AST: 57 U/L — ABNORMAL HIGH (ref 15–41)
Albumin: 3.3 g/dL — ABNORMAL LOW (ref 3.5–5.0)
Alkaline Phosphatase: 107 U/L (ref 38–126)
Bilirubin, Direct: 1.1 mg/dL — ABNORMAL HIGH (ref 0.0–0.2)
Indirect Bilirubin: 1.7 mg/dL — ABNORMAL HIGH (ref 0.3–0.9)
Total Bilirubin: 2.8 mg/dL — ABNORMAL HIGH (ref 0.3–1.2)
Total Protein: 6.5 g/dL (ref 6.5–8.1)

## 2021-05-09 LAB — RETICULOCYTES
Immature Retic Fract: 36.8 % — ABNORMAL HIGH (ref 2.3–15.9)
RBC.: 2.79 MIL/uL — ABNORMAL LOW (ref 3.87–5.11)
Retic Count, Absolute: 307.2 10*3/uL — ABNORMAL HIGH (ref 19.0–186.0)
Retic Ct Pct: 11 % — ABNORMAL HIGH (ref 0.4–3.1)

## 2021-05-09 LAB — TYPE AND SCREEN
ABO/RH(D): AB POS
Antibody Screen: POSITIVE
Unit division: 0

## 2021-05-09 LAB — CBC
HCT: 26.1 % — ABNORMAL LOW (ref 36.0–46.0)
Hemoglobin: 9.1 g/dL — ABNORMAL LOW (ref 12.0–15.0)
MCH: 33 pg (ref 26.0–34.0)
MCHC: 34.9 g/dL (ref 30.0–36.0)
MCV: 94.6 fL (ref 80.0–100.0)
Platelets: 372 10*3/uL (ref 150–400)
RBC: 2.76 MIL/uL — ABNORMAL LOW (ref 3.87–5.11)
RDW: 22.9 % — ABNORMAL HIGH (ref 11.5–15.5)
WBC: 9 10*3/uL (ref 4.0–10.5)
nRBC: 73.5 % — ABNORMAL HIGH (ref 0.0–0.2)

## 2021-05-09 LAB — BPAM RBC
Blood Product Expiration Date: 202305072359
ISSUE DATE / TIME: 202304031204
Unit Type and Rh: 9500

## 2021-05-09 NOTE — Progress Notes (Signed)
Subjective: ?Alicia Villegas is a 21 year old female with a medical history significant for sickle cell disease that was admitted for sickle cell pain crisis. ?Patient has no new complaints on today, her pain is essentially unchanged and is primarily to left shoulder, and lower extremities.  She rates her pain as 7/10.  She denies any dizziness, headache, shortness of breath, urinary symptoms, nausea, vomiting, or diarrhea. ? ?Objective: ? ?Vital signs in last 24 hours: ? ?Vitals:  ? 05/09/21 1032 05/09/21 1154 05/09/21 1348 05/09/21 1703  ?BP:   107/65   ?Pulse:   73   ?Resp: '17 12 16 18  '$ ?Temp:   98.9 ?F (37.2 ?C)   ?TempSrc:   Oral   ?SpO2:   99% 99%  ?Weight:      ?Height:      ? ? ?Intake/Output from previous day: ? ? ?Intake/Output Summary (Last 24 hours) at 05/09/2021 1726 ?Last data filed at 05/09/2021 0800 ?Gross per 24 hour  ?Intake 120 ml  ?Output --  ?Net 120 ml  ? ? ?Physical Exam: ?General: Alert, awake, oriented x3, in no acute distress.  ?HEENT: [place]/AT PEERL, EOMI ?Neck: Trachea midline,  no masses, no thyromegal,y no JVD, no carotid bruit ?OROPHARYNX:  Moist, No exudate/ erythema/lesions.  ?Heart: Regular rate and rhythm, without murmurs, rubs, gallops, PMI non-displaced, no heaves or thrills on palpation.  ?Lungs: Clear to auscultation, no wheezing or rhonchi noted. No increased vocal fremitus resonant to percussion  ?Abdomen: Soft, nontender, nondistended, positive bowel sounds, no masses no hepatosplenomegaly noted.Marland Kitchen  ?Neuro: No focal neurological deficits noted cranial nerves II through XII grossly intact. DTRs 2+ bilaterally upper and lower extremities. Strength 5 out of 5 in bilateral upper and lower extremities. ?Musculoskeletal: No warm swelling or erythema around joints, no spinal tenderness noted. ?Psychiatric: Patient alert and oriented x3, good insight and cognition, good recent to remote recall. ?Lymph node survey: No cervical axillary or inguinal lymphadenopathy noted. ? ?Lab Results: ? ?Basic  Metabolic Panel: ?   ?Component Value Date/Time  ? NA 138 05/09/2021 0938  ? NA 139 11/08/2020 1100  ? K 3.5 05/09/2021 0938  ? CL 109 05/09/2021 0938  ? CO2 21 (L) 05/09/2021 8786  ? BUN 9 05/09/2021 0938  ? BUN 5 (L) 11/08/2020 1100  ? CREATININE <0.30 (L) 05/09/2021 7672  ? CREATININE 0.30 (L) 05/02/2015 1442  ? GLUCOSE 111 (H) 05/09/2021 0947  ? CALCIUM 8.5 (L) 05/09/2021 0962  ? ?CBC: ?   ?Component Value Date/Time  ? WBC 9.0 05/09/2021 0938  ? HGB 9.1 (L) 05/09/2021 8366  ? HGB 7.7 (L) 11/08/2020 1100  ? HCT 26.1 (L) 05/09/2021 2947  ? HCT 23.2 (L) 11/08/2020 1100  ? PLT 372 05/09/2021 0938  ? PLT 665 (H) 11/08/2020 1100  ? MCV 94.6 05/09/2021 0938  ? MCV 91 11/08/2020 1100  ? NEUTROABS 5.8 05/08/2021 0523  ? NEUTROABS 4.6 11/08/2020 1100  ? LYMPHSABS 2.7 05/08/2021 0523  ? LYMPHSABS 3.0 11/08/2020 1100  ? MONOABS 1.0 05/08/2021 0523  ? EOSABS 0.2 05/08/2021 0523  ? EOSABS 0.1 11/08/2020 1100  ? BASOSABS 0.0 05/08/2021 0523  ? BASOSABS 0.1 11/08/2020 1100  ? ? ?Recent Results (from the past 240 hour(s))  ?Blood culture (routine x 2)     Status: Abnormal  ? Collection Time: 05/03/21  5:29 AM  ? Specimen: BLOOD RIGHT HAND  ?Result Value Ref Range Status  ? Specimen Description   Final  ?  BLOOD RIGHT HAND ?Performed at [facility], [place] [address], [place], [place] [address] ?  ? Special Requests   Final  ?  BOTTLES DRAWN AEROBIC ONLY Blood Culture results may not be optimal due to an inadequate volume of blood received in culture bottles ?Performed at [facility], [place] [address], [place], [place] [address] ?  ? Culture  Setup Time   Final  ?  GRAM POSITIVE COCCI ?IN PEDIATRIC BOTTLE ?CRITICAL RESULT CALLED TO, READ BACK BY AND VERIFIED WITH: L POINDEXTER,PHARMD'@0250'$  05/04/21 [place] ?  ? Culture (A)  Final  ?  STAPHYLOCOCCUS WARNERI ?STAPHYLOCOCCUS EPIDERMIDIS ?THE SIGNIFICANCE OF ISOLATING THIS ORGANISM FROM A SINGLE SET OF BLOOD CULTURES WHEN MULTIPLE SETS ARE DRAWN IS  UNCERTAIN. PLEASE NOTIFY THE MICROBIOLOGY DEPARTMENT WITHIN ONE WEEK IF SPECIATION AND SENSITIVITIES ARE REQUIRED. ?Performed at [facility], [place] [address], [place], [place] [address] ?  ? Report Status 05/05/2021 FINAL  Final  ?Blood Culture ID Panel (Reflexed)     Status: Abnormal  ? Collection Time: 05/03/21  5:29 AM  ?Result Value Ref Range Status  ? Enterococcus faecalis NOT DETECTED NOT DETECTED Final  ? Enterococcus Faecium NOT DETECTED NOT DETECTED Final  ? Listeria monocytogenes NOT DETECTED NOT DETECTED Final  ? Staphylococcus species DETECTED (A) NOT DETECTED Final  ?  Comment: CRITICAL RESULT CALLED TO, READ BACK BY AND VERIFIED WITH: ?L POINDEXTER,PHARMD'@0250'$  05/04/21 [place] ?  ? Staphylococcus aureus (BCID) NOT DETECTED NOT DETECTED Final  ? Staphylococcus epidermidis DETECTED (A) NOT DETECTED Final  ?  Comment: CRITICAL RESULT CALLED TO, READ BACK BY AND VERIFIED WITH: ?L POINDEXTER,PHARMD'@0250'$  05/04/21 [place] ?  ? Staphylococcus lugdunensis NOT DETECTED NOT DETECTED Final  ? Streptococcus species NOT DETECTED NOT DETECTED Final  ? Streptococcus agalactiae NOT DETECTED NOT DETECTED Final  ? Streptococcus pneumoniae NOT DETECTED NOT DETECTED Final  ? Streptococcus pyogenes NOT DETECTED NOT DETECTED Final  ? A.calcoaceticus-baumannii NOT DETECTED NOT DETECTED Final  ? Bacteroides fragilis NOT DETECTED NOT DETECTED Final  ? Enterobacterales NOT DETECTED NOT DETECTED Final  ? Enterobacter cloacae complex NOT DETECTED NOT DETECTED Final  ? Escherichia coli NOT DETECTED NOT DETECTED Final  ? Klebsiella aerogenes NOT DETECTED NOT DETECTED Final  ? Klebsiella oxytoca NOT DETECTED NOT DETECTED Final  ? Klebsiella pneumoniae NOT DETECTED NOT DETECTED Final  ? Proteus species NOT DETECTED NOT DETECTED Final  ? Salmonella species NOT DETECTED NOT DETECTED Final  ? Serratia marcescens NOT DETECTED NOT DETECTED Final  ? Haemophilus influenzae NOT DETECTED NOT DETECTED Final  ? Neisseria meningitidis NOT DETECTED  NOT DETECTED Final  ? Pseudomonas aeruginosa NOT DETECTED NOT DETECTED Final  ? Stenotrophomonas maltophilia NOT DETECTED NOT DETECTED Final  ? Candida albicans NOT DETECTED NOT DETECTED Final  ? Candida auris NOT DETECTED NOT DETECTED Final  ? Candida glabrata NOT DETECTED NOT DETECTED Final  ? Candida krusei NOT DETECTED NOT DETECTED Final  ? Candida parapsilosis NOT DETECTED NOT DETECTED Final  ? Candida tropicalis NOT DETECTED NOT DETECTED Final  ? Cryptococcus neoformans/gattii NOT DETECTED NOT DETECTED Final  ? Methicillin resistance mecA/C NOT DETECTED NOT DETECTED Final  ?  Comment: Performed at [facility], [address], [place], [place] [address]  ?Blood culture (routine x 2)     Status: None  ? Collection Time: 05/03/21  5:36 AM  ? Specimen: BLOOD LEFT HAND  ?Result Value Ref Range Status  ? Specimen Description   Final  ?  BLOOD LEFT HAND ?Performed at [facility], [place]  [address], [place], [place] [address] ?  ? Special Requests   Final  ?  BOTTLES DRAWN AEROBIC ONLY Blood Culture results may not be optimal due to an inadequate volume of blood received in culture bottles ?Performed at [facility], [place] [address], [place], [place] [address] ?  ? Culture   Final  ?  NO GROWTH 5 DAYS ?Performed at [facility], [place] [address] [place], [place] [address] ?  ? Report Status 05/08/2021 FINAL  Final  ? ? ?Studies/Results: ?No results found. ? ?Medications: ?Scheduled Meds: ? enoxaparin (LOVENOX) injection  40 mg Subcutaneous Q24H  ? hydroxyurea  1,200 mg Oral Daily  ? morphine   Intravenous Q4H  ? potassium chloride SA  40 mEq Oral Daily  ? propranolol  20 mg Oral Daily  ? senna-docusate  1 tablet Oral BID  ? voxelotor  1,500 mg Oral Daily  ? ?Continuous Infusions: ?PRN Meds:.acetaminophen, diphenhydrAMINE, hydrocortisone cream, lactase, morphine, naloxone **AND** sodium chloride flush, ondansetron (ZOFRAN) IV, polyethylene  glycol ? ?Consultants: ?None ? ?Procedures: ?None ? ?Antibiotics: ?None ? ?Assessment/Plan: ?Principal Problem: ?  Sickle cell pain crisis ([place]) ?Active Problems: ?  Acute chest syndrome ([place]) ?  Leukocytosis ?  Fever of unknown origin (FUO)

## 2021-05-10 DIAGNOSIS — D5700 Hb-SS disease with crisis, unspecified: Secondary | ICD-10-CM | POA: Diagnosis not present

## 2021-05-10 LAB — BASIC METABOLIC PANEL
Anion gap: 8 (ref 5–15)
BUN: 7 mg/dL (ref 6–20)
CO2: 21 mmol/L — ABNORMAL LOW (ref 22–32)
Calcium: 8.7 mg/dL — ABNORMAL LOW (ref 8.9–10.3)
Chloride: 106 mmol/L (ref 98–111)
Creatinine, Ser: <0.3 mg/dL — ABNORMAL LOW (ref 0.44–1.00)
Glucose, Bld: 105 mg/dL — ABNORMAL HIGH (ref 70–99)
Potassium: 4.2 mmol/L (ref 3.5–5.1)
Sodium: 135 mmol/L (ref 135–145)

## 2021-05-10 LAB — CBC
HCT: 23.5 % — ABNORMAL LOW (ref 36.0–46.0)
Hemoglobin: 8.4 g/dL — ABNORMAL LOW (ref 12.0–15.0)
MCH: 33.5 pg (ref 26.0–34.0)
MCHC: 35.7 g/dL (ref 30.0–36.0)
MCV: 93.6 fL (ref 80.0–100.0)
Platelets: 401 10*3/uL — ABNORMAL HIGH (ref 150–400)
RBC: 2.51 MIL/uL — ABNORMAL LOW (ref 3.87–5.11)
RDW: 23 % — ABNORMAL HIGH (ref 11.5–15.5)
WBC: 10.9 10*3/uL — ABNORMAL HIGH (ref 4.0–10.5)
nRBC: 32.6 % — ABNORMAL HIGH (ref 0.0–0.2)

## 2021-05-10 LAB — CREATININE, SERUM
Creatinine, Ser: 0.35 mg/dL — ABNORMAL LOW (ref 0.44–1.00)
GFR, Estimated: >60 mL/min (ref >60)

## 2021-05-10 LAB — LACTATE DEHYDROGENASE: LDH: 1079 U/L — ABNORMAL HIGH (ref 98–192)

## 2021-05-10 MED ORDER — MORPHINE SULFATE (PF) 4 MG/ML IV SOLN
4.0000 mg | INTRAVENOUS | Status: DC | PRN
  Administered 2021-05-10: 4 mg via INTRAVENOUS
  Filled 2021-05-10: qty 1

## 2021-05-10 MED ORDER — MORPHINE SULFATE 15 MG PO TABS
15.0000 mg | ORAL_TABLET | ORAL | Status: DC
  Administered 2021-05-10 (×2): 15 mg via ORAL
  Filled 2021-05-10 (×4): qty 1

## 2021-05-10 NOTE — Progress Notes (Signed)
PCA has been discontinued at this time. RN wasted with Theora Gianotti RN. A total of 15cc is the waste amount and disposed of correctly.  ?

## 2021-05-11 DIAGNOSIS — D5700 Hb-SS disease with crisis, unspecified: Secondary | ICD-10-CM | POA: Diagnosis not present

## 2021-05-11 LAB — COMPREHENSIVE METABOLIC PANEL
ALT: 20 U/L (ref 0–44)
AST: 47 U/L — ABNORMAL HIGH (ref 15–41)
Albumin: 3.7 g/dL (ref 3.5–5.0)
Alkaline Phosphatase: 116 U/L (ref 38–126)
Anion gap: 10 (ref 5–15)
BUN: 9 mg/dL (ref 6–20)
CO2: 21 mmol/L — ABNORMAL LOW (ref 22–32)
Calcium: 9.1 mg/dL (ref 8.9–10.3)
Chloride: 105 mmol/L (ref 98–111)
Creatinine, Ser: 0.32 mg/dL — ABNORMAL LOW (ref 0.44–1.00)
GFR, Estimated: >60 mL/min (ref >60)
Glucose, Bld: 93 mg/dL (ref 70–99)
Potassium: 3.4 mmol/L — ABNORMAL LOW (ref 3.5–5.1)
Sodium: 136 mmol/L (ref 135–145)
Total Bilirubin: 2.4 mg/dL — ABNORMAL HIGH (ref 0.3–1.2)
Total Protein: 7.8 g/dL (ref 6.5–8.1)

## 2021-05-11 LAB — CBC WITH DIFFERENTIAL/PLATELET
Abs Immature Granulocytes: 0.07 10*3/uL (ref 0.00–0.07)
Basophils Absolute: 0 10*3/uL (ref 0.0–0.1)
Basophils Relative: 0 %
Eosinophils Absolute: 0.1 10*3/uL (ref 0.0–0.5)
Eosinophils Relative: 1 %
HCT: 26.1 % — ABNORMAL LOW (ref 36.0–46.0)
Hemoglobin: 8.9 g/dL — ABNORMAL LOW (ref 12.0–15.0)
Immature Granulocytes: 1 %
Lymphocytes Relative: 23 %
Lymphs Abs: 2.6 10*3/uL (ref 0.7–4.0)
MCH: 33 pg (ref 26.0–34.0)
MCHC: 34.1 g/dL (ref 30.0–36.0)
MCV: 96.7 fL (ref 80.0–100.0)
Monocytes Absolute: 1.2 10*3/uL — ABNORMAL HIGH (ref 0.1–1.0)
Monocytes Relative: 11 %
Neutro Abs: 7.5 10*3/uL (ref 1.7–7.7)
Neutrophils Relative %: 64 %
Platelets: 378 10*3/uL (ref 150–400)
RBC: 2.7 MIL/uL — ABNORMAL LOW (ref 3.87–5.11)
RDW: 22.4 % — ABNORMAL HIGH (ref 11.5–15.5)
WBC: 11.5 10*3/uL — ABNORMAL HIGH (ref 4.0–10.5)
nRBC: 15.6 % — ABNORMAL HIGH (ref 0.0–0.2)

## 2021-05-11 LAB — LACTATE DEHYDROGENASE: LDH: 1041 U/L — ABNORMAL HIGH (ref 98–192)

## 2021-05-11 MED ORDER — OXYCODONE-ACETAMINOPHEN 5-325 MG PO TABS: 1.0000 | ORAL_TABLET | ORAL | 0 refills | Status: AC | PRN

## 2021-05-11 MED ORDER — FOLIC ACID 1 MG PO TABS: 1.0000 mg | ORAL_TABLET | Freq: Every day | ORAL | 1 refills | Status: AC

## 2021-05-11 NOTE — Progress Notes (Signed)
Subjective: ?Alicia Villegas is a 21 year old female with a medical history significant for sickle cell disease that was admitted for sickle cell pain crisis. ? ?Patient has no new complaints on today, her pain is essentially unchanged and is primarily to left shoulder, and lower extremities.  She rates her pain as 7/10.  She denies any dizziness, headache, shortness of breath, urinary symptoms, nausea, vomiting, or diarrhea. ? ?Objective: ? ?Vital signs in last 24 hours: ? ?Vitals:  ? 05/10/21 2050 05/11/21 0300 05/11/21 0420 05/11/21 0626  ?BP: 110/69   114/64  ?Pulse: 85   88  ?Resp: 18   16  ?Temp: 99.3 ?F (37.4 ?C) (!) 103 ?F (39.4 ?C) (!) 102.6 ?F (39.2 ?C) 99 ?F (37.2 ?C)  ?TempSrc: Oral Oral Oral Oral  ?SpO2: 100%   98%  ?Weight:      ?Height:      ? ? ?Intake/Output from previous day: ? ? ?Intake/Output Summary (Last 24 hours) at 05/11/2021 0833 ?Last data filed at 05/10/2021 2200 ?Gross per 24 hour  ?Intake 240 ml  ?Output --  ?Net 240 ml  ? ? ?Physical Exam: ?General: Alert, awake, oriented x3, in no acute distress.  ?HEENT: [place]/AT PEERL, EOMI ?Neck: Trachea midline,  no masses, no thyromegal,y no JVD, no carotid bruit ?OROPHARYNX:  Moist, No exudate/ erythema/lesions.  ?Heart: Regular rate and rhythm, without murmurs, rubs, gallops, PMI non-displaced, no heaves or thrills on palpation.  ?Lungs: Clear to auscultation, no wheezing or rhonchi noted. No increased vocal fremitus resonant to percussion  ?Abdomen: Soft, nontender, nondistended, positive bowel sounds, no masses no hepatosplenomegaly noted.Marland Kitchen  ?Neuro: No focal neurological deficits noted cranial nerves II through XII grossly intact. DTRs 2+ bilaterally upper and lower extremities. Strength 5 out of 5 in bilateral upper and lower extremities. ?Musculoskeletal: No warm swelling or erythema around joints, no spinal tenderness noted. ?Psychiatric: Patient alert and oriented x3, good insight and cognition, good recent to remote recall. ?Lymph node survey: No  cervical axillary or inguinal lymphadenopathy noted. ? ?Lab Results: ? ?Basic Metabolic Panel: ?   ?Component Value Date/Time  ? NA 135 05/10/2021 1047  ? NA 139 11/08/2020 1100  ? K 4.2 05/10/2021 1047  ? CL 106 05/10/2021 1047  ? CO2 21 (L) 05/10/2021 1047  ? BUN 7 05/10/2021 1047  ? BUN 5 (L) 11/08/2020 1100  ? CREATININE <0.30 (L) 05/10/2021 1047  ? CREATININE 0.30 (L) 05/02/2015 1442  ? GLUCOSE 105 (H) 05/10/2021 1047  ? CALCIUM 8.7 (L) 05/10/2021 1047  ? ?CBC: ?   ?Component Value Date/Time  ? WBC 10.9 (H) 05/10/2021 1627  ? HGB 8.4 (L) 05/10/2021 1627  ? HGB 7.7 (L) 11/08/2020 1100  ? HCT 23.5 (L) 05/10/2021 1627  ? HCT 23.2 (L) 11/08/2020 1100  ? PLT 401 (H) 05/10/2021 1627  ? PLT 665 (H) 11/08/2020 1100  ? MCV 93.6 05/10/2021 1627  ? MCV 91 11/08/2020 1100  ? NEUTROABS 5.8 05/08/2021 0523  ? NEUTROABS 4.6 11/08/2020 1100  ? LYMPHSABS 2.7 05/08/2021 0523  ? LYMPHSABS 3.0 11/08/2020 1100  ? MONOABS 1.0 05/08/2021 0523  ? EOSABS 0.2 05/08/2021 0523  ? EOSABS 0.1 11/08/2020 1100  ? BASOSABS 0.0 05/08/2021 0523  ? BASOSABS 0.1 11/08/2020 1100  ? ? ?Recent Results (from the past 240 hour(s))  ?Blood culture (routine x 2)     Status: Abnormal  ? Collection Time: 05/03/21  5:29 AM  ? Specimen: BLOOD RIGHT HAND  ?Result Value Ref Range Status  ? Specimen Description  Final  ?  BLOOD RIGHT HAND ?Performed at [facility], [place] [address], [place], [place] [address] ?  ? Special Requests   Final  ?  BOTTLES DRAWN AEROBIC ONLY Blood Culture results may not be optimal due to an inadequate volume of blood received in culture bottles ?Performed at [facility], [place] [address], [place], [place] [address] ?  ? Culture  Setup Time   Final  ?  GRAM POSITIVE COCCI ?IN PEDIATRIC BOTTLE ?CRITICAL RESULT CALLED TO, READ BACK BY AND VERIFIED WITH: L POINDEXTER,PHARMD'@0250'$  05/04/21 [place] ?  ? Culture (A)  Final  ?  STAPHYLOCOCCUS WARNERI ?STAPHYLOCOCCUS EPIDERMIDIS ?THE SIGNIFICANCE OF ISOLATING  THIS ORGANISM FROM A SINGLE SET OF BLOOD CULTURES WHEN MULTIPLE SETS ARE DRAWN IS UNCERTAIN. PLEASE NOTIFY THE MICROBIOLOGY DEPARTMENT WITHIN ONE WEEK IF SPECIATION AND SENSITIVITIES ARE REQUIRED. ?Performed at [facility], [place] [address] [place], [place] [address] ?  ? Report Status 05/05/2021 FINAL  Final  ?Blood Culture ID Panel (Reflexed)     Status: Abnormal  ? Collection Time: 05/03/21  5:29 AM  ?Result Value Ref Range Status  ? Enterococcus faecalis NOT DETECTED NOT DETECTED Final  ? Enterococcus Faecium NOT DETECTED NOT DETECTED Final  ? Listeria monocytogenes NOT DETECTED NOT DETECTED Final  ? Staphylococcus species DETECTED (A) NOT DETECTED Final  ?  Comment: CRITICAL RESULT CALLED TO, READ BACK BY AND VERIFIED WITH: ?L POINDEXTER,PHARMD'@0250'$  05/04/21 [place] ?  ? Staphylococcus aureus (BCID) NOT DETECTED NOT DETECTED Final  ? Staphylococcus epidermidis DETECTED (A) NOT DETECTED Final  ?  Comment: CRITICAL RESULT CALLED TO, READ BACK BY AND VERIFIED WITH: ?L POINDEXTER,PHARMD'@0250'$  05/04/21 [place] ?  ? Staphylococcus lugdunensis NOT DETECTED NOT DETECTED Final  ? Streptococcus species NOT DETECTED NOT DETECTED Final  ? Streptococcus agalactiae NOT DETECTED NOT DETECTED Final  ? Streptococcus pneumoniae NOT DETECTED NOT DETECTED Final  ? Streptococcus pyogenes NOT DETECTED NOT DETECTED Final  ? A.calcoaceticus-baumannii NOT DETECTED NOT DETECTED Final  ? Bacteroides fragilis NOT DETECTED NOT DETECTED Final  ? Enterobacterales NOT DETECTED NOT DETECTED Final  ? Enterobacter cloacae complex NOT DETECTED NOT DETECTED Final  ? Escherichia coli NOT DETECTED NOT DETECTED Final  ? Klebsiella aerogenes NOT DETECTED NOT DETECTED Final  ? Klebsiella oxytoca NOT DETECTED NOT DETECTED Final  ? Klebsiella pneumoniae NOT DETECTED NOT DETECTED Final  ? Proteus species NOT DETECTED NOT DETECTED Final  ? Salmonella species NOT DETECTED NOT DETECTED Final  ? Serratia marcescens NOT DETECTED NOT DETECTED Final  ? Haemophilus  influenzae NOT DETECTED NOT DETECTED Final  ? Neisseria meningitidis NOT DETECTED NOT DETECTED Final  ? Pseudomonas aeruginosa NOT DETECTED NOT DETECTED Final  ? Stenotrophomonas maltophilia NOT DETECTED NOT DETECTED Final  ? Candida albicans NOT DETECTED NOT DETECTED Final  ? Candida auris NOT DETECTED NOT DETECTED Final  ? Candida glabrata NOT DETECTED NOT DETECTED Final  ? Candida krusei NOT DETECTED NOT DETECTED Final  ? Candida parapsilosis NOT DETECTED NOT DETECTED Final  ? Candida tropicalis NOT DETECTED NOT DETECTED Final  ? Cryptococcus neoformans/gattii NOT DETECTED NOT DETECTED Final  ? Methicillin resistance mecA/C NOT DETECTED NOT DETECTED Final  ?  Comment: Performed at [facility], [address], [place], [place] [address]  ?Blood culture (routine x 2)     Status: None  ? Collection Time: 05/03/21  5:36 AM  ? Specimen: BLOOD LEFT HAND  ?Result Value Ref Range Status  ? Specimen Description   Final  ?  BLOOD LEFT HAND ?Performed at [facility], [place] [address] [place], [place] [address] ?  ? Special Requests   Final  ?  BOTTLES DRAWN AEROBIC ONLY Blood Culture results may not be optimal due to an inadequate volume of blood received in culture bottles ?Performed at [name], [place] [address], [place], [place] [address] ?  ? Culture   Final  ?  NO GROWTH 5 DAYS ?Performed at [facility], [place] [address] [place], [place] [address] ?  ? Report Status 05/08/2021 FINAL  Final  ? ? ?Studies/Results: ?No results found. ? ?Medications: ?Scheduled Meds: ? enoxaparin (LOVENOX) injection  40 mg Subcutaneous Q24H  ? hydroxyurea  1,200 mg Oral Daily  ? morphine  15 mg Oral Q4H while awake  ? potassium chloride SA  40 mEq Oral Daily  ? propranolol  20 mg Oral Daily  ? senna-docusate  1 tablet Oral BID  ? voxelotor  1,500 mg Oral Daily  ? ?Continuous Infusions: ?PRN Meds:.acetaminophen, hydrocortisone cream, lactase, morphine injection, polyethylene  glycol ? ?Consultants: ?None ? ?Procedures: ?None ? ?Antibiotics: ?None ? ?Assessment/Plan: ?Principal Problem: ?  Sickle cell pain crisis ([place]) ?Active Problems: ?  Acute chest syndrome ([place]) ?  Leukocytosis ?  Fever

## 2021-05-11 NOTE — Progress Notes (Signed)
Patient stated she felt hot. Temperature checked and it was 103 F. Tylenol administered. Ice packs placed, thick blankets removed from patient, and room temperature turned down. Patient's temperature is making her show a yellow MEWs. MD notified. ?

## 2021-05-11 NOTE — Progress Notes (Signed)
Patient refused her 0600 dose of morphine (MSIR) tablet. MD notified. ?

## 2021-05-12 ENCOUNTER — Telehealth: Payer: Self-pay

## 2021-05-12 NOTE — Telephone Encounter (Signed)
Transition Care Management Unsuccessful Follow-up Telephone Call ? ?Date of discharge and from where:  05/11/2021-  ? ?Attempts:  1st Attempt ? ?Reason for unsuccessful TCM follow-up call:  Left voice message ? ?  ?

## 2021-05-16 NOTE — Telephone Encounter (Signed)
Transition Care Management Unsuccessful Follow-up Telephone Call ? ?Date of discharge and from where:  05/11/2021-[place]  ? ?Attempts:  2nd Attempt ? ?Reason for unsuccessful TCM follow-up call:  Left voice message ? ?  ?

## 2021-05-17 NOTE — Telephone Encounter (Signed)
Transition Care Management Unsuccessful Follow-up Telephone Call ? ?Date of discharge and from where:  05/11/2021-  ? ?Attempts:  3rd Attempt ? ?Reason for unsuccessful TCM follow-up call:  Left voice message ? ?  ?

## 2021-05-22 NOTE — Discharge Summary (Signed)
Physician Discharge Summary  ?Alicia Villegas WPY:[id number] DOB: Mar 30, 2000 DOA: 05/02/2021 ? ?PCP: Dorena Dew, FNP ? ?Admit date: 05/02/2021 ? ?Discharge date: 05/11/2021 ? ?Discharge Diagnoses:  ?Principal Problem: ?  Sickle cell pain crisis ([place]) ?Active Problems: ?  Acute chest syndrome ([place]) ?  Leukocytosis ?  Fever of unknown origin (FUO) ? ? ?Discharge Condition: Stable ? ?Disposition:  ? Follow-up Information   ? ? Dorena Dew, FNP Follow up on 05/15/2021.   ?Specialty: Family Medicine ?Why: Patient warrants a CBC with differential, LDH, and CMP ?Contact information: ?[address] ?[address] ?[place] [address] ?[phone] ? ? ?  ?  ? ?  ?  ? ?  ? ?Pt is discharged home in good condition and is to follow up with Dorena Dew, FNP this week to have labs evaluated. Alicia Villegas is instructed to increase activity slowly and balance with rest for the next few days, and use prescribed medication to complete treatment of pain ? ?Diet: Regular ?Wt Readings from Last 3 Encounters:  ?05/03/21 50.3 kg (17 %, Z= -0.94)*  ?04/27/21 50.3 kg (18 %, Z= -0.93)*  ?04/11/21 48.7 kg (12 %, Z= -1.19)*  ? ?* Growth percentiles are based on CDC (Girls, 2-20 Years) data.  ? ? ?History of present illness:  ?Alicia Villegas is a 21 year old female with a medical history significant for sickle cell disease and anemia of chronic disease presented to the ER due to increasing allover body pain since last evening.  Pain is mostly across chest, lower back, and lower extremities.  Patient denies any productive cough, headache, nausea, vomiting, or diarrhea.  Patient was recently discharged from inpatient services on 04/28/2021 for symptomatic anemia, which she received 2 units of PRBCs. ? ?ER course: ?In the ER, patient was not hypoxic but became febrile with a temperature of 100.4 ?F with labs showing leukocytosis, chest x-ray unremarkable.  Patient was started on fluids, pain medications, and blood cultures were obtained.   COVID-19 is negative.  Pregnancy screen negative.  Antibiotics were also initiated in the emergency department.  There was also concern for acute chest syndrome.  Patient admitted for further management of sickle cell pain crisis. ?Hospital Course:  ?Sickle cell disease with pain crisis: ?Patient was admitted for sickle cell pain crisis and managed appropriately with IVF, IV morphine via PCA and IV Toradol, as well as other adjunct therapies per sickle cell pain management protocols.  Patient was slow to respond to IV medication protocol and was slowly transition to oral medications. ?Today, patient states that pain intensity has improved to left upper extremity and is requesting discharge home.  She rates her pain as 5/10. ?Acute chest syndrome: ?During admission, patient was consistently hypoxic and had an oxygen requirement with a maximum of 2 L.  She underwent 2 simple exchange transfusions and received a total of 3 units PRBCs throughout admission.  Prior to discharge, hemoglobin is 8.9 g/dL.  Patient will follow-up with PCP in 1 week to repeat CBC with differential, LDH and CMP. ?Prior to discharge, ambulating pulse ox remains above 90%.  Patient is alert, oriented, and not requiring assistance with ambulation.  She is afebrile.  Patient is aware of all follow-up appointments.  Advised to resume her home medications including hydroxyurea and Oxbryta. ?Patient will also follow-up for medication management. ? ?Patient was therefore discharged home today in a hemodynamically stable condition.  ? ?Delesha will follow-up with PCP within 1 week of this discharge. Novalee was counseled extensively about  nonpharmacologic means of pain management, patient verbalized understanding and was appreciative of  the care received during this admission.  ? ?We discussed the need for good hydration, monitoring of hydration status, avoidance of heat, cold, stress, and infection triggers. We discussed the need to be adherent with  taking Hydrea and other home medications. Patient was reminded of the need to seek medical attention immediately if any symptom of bleeding, anemia, or infection occurs. ? ?Discharge Exam: ?Vitals:  ? 05/11/21 0420 05/11/21 0626  ?BP:  114/64  ?Pulse:  88  ?Resp:  16  ?Temp: (!) 102.6 ?F (39.2 ?C) 99 ?F (37.2 ?C)  ?SpO2:  98%  ? ?Vitals:  ? 05/10/21 2050 05/11/21 0300 05/11/21 0420 05/11/21 0626  ?BP: 110/69   114/64  ?Pulse: 85   88  ?Resp: 18   16  ?Temp: 99.3 ?F (37.4 ?C) (!) 103 ?F (39.4 ?C) (!) 102.6 ?F (39.2 ?C) 99 ?F (37.2 ?C)  ?TempSrc: Oral Oral Oral Oral  ?SpO2: 100%   98%  ?Weight:      ?Height:      ? ? ?General appearance : Awake, alert, not in any distress. Speech Clear. Not toxic looking ?HEENT: Atraumatic and Normocephalic, pupils equally reactive to light and accomodation ?Neck: Supple, no JVD. No cervical lymphadenopathy.  ?Chest: Good air entry bilaterally, no added sounds  ?CVS: S1 S2 regular, no murmurs.  ?Abdomen: Bowel sounds present, Non tender and not distended with no gaurding, rigidity or rebound. ?Extremities: B/L Lower Ext shows no edema, both legs are warm to touch ?Neurology: Awake alert, and oriented X 3, CN II-XII intact, Non focal ?Skin: No Rash ? ?Discharge Instructions ? ?Discharge Instructions   ? ? Discharge patient   Complete by: As directed ?  ? Discharge disposition: 01-Home or Self Care  ? Discharge patient date: 05/11/2021  ? ?  ? ?Allergies as of 05/11/2021   ?No Known Allergies ?  ? ?  ?Medication List  ?  ? ?TAKE these medications   ? ?acetaminophen 325 MG tablet ?Commonly known as: TYLENOL ?Take 650 mg by mouth every 6 (six) hours as needed for moderate pain or mild pain. ?  ?folic acid 1 MG tablet ?Commonly known as: FOLVITE ?Take 1 tablet (1 mg total) by mouth daily. ?  ?hydroxyurea 100 mg/mL Susp ?Commonly known as: HYDREA ?Take 1,200 mg by mouth daily. ?  ?ibuprofen 200 MG tablet ?Commonly known as: ADVIL ?Take 400 mg by mouth every 6 (six) hours as needed for moderate  pain or mild pain. ?  ?oxyCODONE-acetaminophen 5-325 MG tablet ?Commonly known as: Percocet ?Take 1 tablet by mouth every 4 (four) hours as needed for severe pain. ?  ?propranolol 20 MG tablet ?Commonly known as: INDERAL ?Take 1 tablet (20 mg total) by mouth daily. ?  ?voxelotor 500 MG Tabs tablet ?Commonly known as: OXBRYTA ?Take 1,500 mg by mouth daily. ?  ? ?  ? ? ?The results of significant diagnostics from this hospitalization (including imaging, microbiology, ancillary and laboratory) are listed below for reference.   ? ?Significant Diagnostic Studies: ?DG Chest 2 View ? ?Result Date: 05/03/2021 ?CLINICAL DATA:  Chest pain. EXAM: CHEST - 2 VIEW COMPARISON:  Chest radiograph dated 03/08/2021. FINDINGS: No focal consolidation, pleural effusion or pneumothorax. Borderline cardiomegaly. No acute osseous pathology. IMPRESSION: No acute cardiopulmonary process. Borderline cardiomegaly. Electronically Signed   By: Anner Crete M.D.   On: 05/03/2021 01:02   ? ?Microbiology: ?No results found for this or any previous visit (from  the past 240 hour(s)).  ? ?Labs: ?Basic Metabolic Panel: ?No results for input(s): NA, K, CL, CO2, GLUCOSE, BUN, CREATININE, CALCIUM, MG, PHOS in the last 168 hours. ?Liver Function Tests: ?No results for input(s): AST, ALT, ALKPHOS, BILITOT, PROT, ALBUMIN in the last 168 hours. ?No results for input(s): LIPASE, AMYLASE in the last 168 hours. ?No results for input(s): AMMONIA in the last 168 hours. ?CBC: ?No results for input(s): WBC, NEUTROABS, HGB, HCT, MCV, PLT in the last 168 hours. ?Cardiac Enzymes: ?No results for input(s): CKTOTAL, CKMB, CKMBINDEX, TROPONINI in the last 168 hours. ?BNP: ?Invalid input(s): POCBNP ?CBG: ?No results for input(s): GLUCAP in the last 168 hours. ? ?Time coordinating discharge: 30 minutes ? ?Signed: ? ?Donia Pounds  APRN, MSN, FNP-C ?[facility] [place] ?[facility] ?[address]  ?[place], [place] [address] ?[phone] ? ?[facility] ?05/22/2021, 5:51 AM  ?

## 2021-08-02 DIAGNOSIS — K5900 Constipation, unspecified: Secondary | ICD-10-CM | POA: Diagnosis not present

## 2021-08-02 DIAGNOSIS — G43909 Migraine, unspecified, not intractable, without status migrainosus: Secondary | ICD-10-CM | POA: Diagnosis not present

## 2021-08-02 DIAGNOSIS — D571 Sickle-cell disease without crisis: Secondary | ICD-10-CM | POA: Diagnosis not present

## 2021-08-02 DIAGNOSIS — M7918 Myalgia, other site: Secondary | ICD-10-CM | POA: Diagnosis not present

## 2021-08-02 DIAGNOSIS — E559 Vitamin D deficiency, unspecified: Secondary | ICD-10-CM | POA: Diagnosis not present

## 2021-08-02 DIAGNOSIS — G8929 Other chronic pain: Secondary | ICD-10-CM | POA: Diagnosis not present

## 2021-08-02 DIAGNOSIS — R109 Unspecified abdominal pain: Secondary | ICD-10-CM | POA: Diagnosis not present

## 2021-08-02 DIAGNOSIS — D638 Anemia in other chronic diseases classified elsewhere: Secondary | ICD-10-CM | POA: Diagnosis not present

## 2021-11-08 ENCOUNTER — Ambulatory Visit (INDEPENDENT_AMBULATORY_CARE_PROVIDER_SITE_OTHER): Payer: Medicaid Other | Admitting: Nurse Practitioner

## 2021-11-08 ENCOUNTER — Encounter: Payer: Self-pay | Admitting: Nurse Practitioner

## 2021-11-08 VITALS — BP 101/63 | HR 78 | Temp 98.1°F | Ht 61.0 in | Wt 111.6 lb

## 2021-11-08 DIAGNOSIS — D571 Sickle-cell disease without crisis: Secondary | ICD-10-CM

## 2021-11-08 NOTE — Progress Notes (Signed)
$'@Patient'd$  ID: Alicia Villegas, female    DOB: December 21, 2000, 21 y.o.   MRN: [id number]  Chief Complaint  Patient presents with   Follow-up    Pt complaining of excessive fatigue and yellowing in her eyes for X2 months     Referring provider: Dorena Dew, FNP   HPI  Alicia Villegas 21 y.o. female  has a past medical history of Cancer 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  Patient presents today for sickle cell follow up. Upon chart review patient has been seeing hematology oncology at [facility]. We discussed that she does not need to be seen at both offices. She states that she is having fatigued and thinks that her eyes have been looking yellow. Will check labs today. Denies f/c/s, n/v/d, hemoptysis, PND, leg swelling Denies chest pain or edema    No Known Allergies  Immunization History  Administered Date(s) Administered   HPV 9-valent 12/01/2014, 10/09/2016   Hepatitis A 02/14/2011, 04/15/2012   Hepatitis A, Ped/Adol-2 Dose 01/17/2009, 09/05/2009, 02/14/2011, 04/15/2012   Hepatitis B 02/14/2011, 06/21/2011, 04/15/2012   Hepatitis B, PED/ADOLESCENT 01/17/2009, 02/25/2009, 09/05/2009, 02/14/2011, 06/21/2011, 04/15/2012   IPV 01/17/2009, 02/25/2009, 04/25/2009, 02/14/2011, 06/21/2011, 04/15/2012   Influenza Split 01/17/2009, 02/25/2009, 12/26/2009, 12/02/2012   Influenza, Seasonal, Injecte, Preservative Fre 12/01/2014   Influenza,inj,Quad PF,6+ Mos 12/01/2014, 11/18/2015, 01/19/2017, 01/09/2018, 11/08/2020   Influenza,inj,quad, With Preservative 11/12/2013   Influenza-Unspecified 02/14/2011, 04/15/2012, 11/12/2013   MMR 01/17/2009, 02/25/2009, 02/14/2011, 06/21/2011   Meningococcal Conjugate 09/06/2009, 01/17/2010, 12/01/2014, 09/06/2020   Pneumococcal Conjugate-13 04/27/2009, 05/20/2009, 08/24/2013   Pneumococcal Polysaccharide-23 01/17/2009, 06/17/2014   Td 02/14/2011,  06/21/2011, 04/15/2012   Tdap 01/17/2009, 02/25/2009, 01/17/2010, 01/17/2010, 02/14/2011, 06/21/2011, 04/15/2012   Varicella 01/17/2009, 02/25/2009, 02/14/2011, 06/21/2011    Past Medical History:  Diagnosis Date   Cancer ([place])    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   Malaria 02/05/2006   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   folic acid (FOLVITE) 1 MG tablet Take 1 tablet (1 mg total) by mouth daily.   hydroxyurea (HYDREA) 100 mg/mL SUSP Take 1,200 mg by mouth daily.   ibuprofen (ADVIL) 200 MG tablet Take 400 mg by mouth every 6 (six) hours as needed for moderate pain or mild pain.   oxyCODONE-acetaminophen (PERCOCET) 5-325 MG tablet Take 1 tablet by mouth every 4 (four) hours as needed for severe pain.   propranolol (INDERAL) 20 MG tablet Take 1 tablet (20 mg total) by mouth daily.   voxelotor (OXBRYTA) 500 MG TABS tablet Take 1,500 mg by mouth daily.   No facility-administered encounter medications on file as of 11/08/2021.     Review of Systems  Review of Systems  Constitutional:  Positive for fatigue.  HENT: Negative.    Cardiovascular: Negative.   Gastrointestinal: Negative.   Allergic/Immunologic: Negative.   Neurological: Negative.   Psychiatric/Behavioral: Negative.         Physical Exam  BP 101/63 (BP Location: Right Arm, Patient Position: Sitting,  Cuff Size: Normal)   Pulse 78   Temp 98.1 F (36.7 C)   Ht $R'5\' 1"'Nh$  (1.549 m)   Wt 111 lb 9.6 oz (50.6 kg)   LMP 10/30/2021   SpO2 98%   BMI 21.09 kg/m   Wt  Readings from Last 5 Encounters:  11/08/21 111 lb 9.6 oz (50.6 kg) (18 %, Z= -0.93)*  05/03/21 111 lb (50.3 kg) (17 %, Z= -0.94)*  04/27/21 111 lb (50.3 kg) (18 %, Z= -0.93)*  04/11/21 107 lb 6 oz (48.7 kg) (12 %, Z= -1.19)*  03/24/21 107 lb 9.4 oz (48.8 kg) (12 %, Z= -1.17)*   * Growth percentiles are based on CDC (Girls, 2-20 Years) data.     Physical Exam Vitals and nursing note reviewed.  Constitutional:      General: She is not in acute distress.    Appearance: She is well-developed.  Cardiovascular:     Rate and Rhythm: Normal rate and regular rhythm.  Pulmonary:     Effort: Pulmonary effort is normal.     Breath sounds: Normal breath sounds.  Neurological:     Mental Status: She is alert and oriented to person, place, and time.      Lab Results:  CBC    Component Value Date/Time   WBC 11.5 (H) 05/11/2021 0829   RBC 2.70 (L) 05/11/2021 0829   HGB 8.9 (L) 05/11/2021 0829   HGB 7.7 (L) 11/08/2020 1100   HCT 26.1 (L) 05/11/2021 0829   HCT 23.2 (L) 11/08/2020 1100   PLT 378 05/11/2021 0829   PLT 665 (H) 11/08/2020 1100   MCV 96.7 05/11/2021 0829   MCV 91 11/08/2020 1100   MCH 33.0 05/11/2021 0829   MCHC 34.1 05/11/2021 0829   RDW 22.4 (H) 05/11/2021 0829   RDW 20.4 (H) 11/08/2020 1100   LYMPHSABS 2.6 05/11/2021 0829   LYMPHSABS 3.0 11/08/2020 1100   MONOABS 1.2 (H) 05/11/2021 0829   EOSABS 0.1 05/11/2021 0829   EOSABS 0.1 11/08/2020 1100   BASOSABS 0.0 05/11/2021 0829   BASOSABS 0.1 11/08/2020 1100    BMET    Component Value Date/Time   NA 136 05/11/2021 0829   NA 139 11/08/2020 1100   K 3.4 (L) 05/11/2021 0829   CL 105 05/11/2021 0829   CO2 21 (L) 05/11/2021 0829   GLUCOSE 93 05/11/2021 0829   BUN 9 05/11/2021 0829   BUN 5 (L) 11/08/2020 1100   CREATININE 0.32 (L) 05/11/2021 0829   CREATININE 0.30 (L) 05/02/2015 1442   CALCIUM 9.1 05/11/2021 0829   GFRNONAA >60 05/11/2021 0829   GFRAA NOT CALCULATED 02/19/2018 0932      Assessment & Plan:    Sickle cell disease, 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

## 2021-11-08 NOTE — Assessment & Plan Note (Signed)
Sickle Cell Panel  -Discussed need for good hydration  -Monitor hydration status  -Avoid heat, cold, stress, and infectious triggers  -Discussed the importance of adherence to medication regimen  -Please seek medical attention immediately if any symptoms of bleeding, anemia, infection occurs    Follow up:  Follow up in 6 months with [place]

## 2021-11-08 NOTE — Patient Instructions (Signed)
1. 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

## 2021-11-09 ENCOUNTER — Other Ambulatory Visit: Payer: Self-pay | Admitting: Nurse Practitioner

## 2021-11-09 DIAGNOSIS — E559 Vitamin D deficiency, unspecified: Secondary | ICD-10-CM

## 2021-11-09 MED ORDER — VITAMIN D (ERGOCALCIFEROL) 1.25 MG (50000 UNIT) PO CAPS: 50000.0000 [IU] | ORAL_CAPSULE | ORAL | 0 refills | Status: DC

## 2021-11-10 LAB — CMP14+CBC/D/PLT+FER+RETIC+V...
ALT: 8 IU/L (ref 0–32)
AST: 33 IU/L (ref 0–40)
Albumin/Globulin Ratio: 1.8 (ref 1.2–2.2)
Albumin: 4.6 g/dL (ref 4.0–5.0)
Alkaline Phosphatase: 94 IU/L (ref 42–106)
BUN/Creatinine Ratio: 23 (ref 9–23)
BUN: 9 mg/dL (ref 6–20)
Basophils Absolute: 0.1 10*3/uL (ref 0.0–0.2)
Basos: 1 %
Bilirubin Total: 2.4 mg/dL — ABNORMAL HIGH (ref 0.0–1.2)
CO2: 16 mmol/L — ABNORMAL LOW (ref 20–29)
Calcium: 8.9 mg/dL (ref 8.7–10.2)
Chloride: 106 mmol/L (ref 96–106)
Creatinine, Ser: 0.4 mg/dL — ABNORMAL LOW (ref 0.57–1.00)
EOS (ABSOLUTE): 0.1 10*3/uL (ref 0.0–0.4)
Eos: 2 %
Ferritin: 260 ng/mL — ABNORMAL HIGH (ref 15–77)
Globulin, Total: 2.5 g/dL (ref 1.5–4.5)
Glucose: 83 mg/dL (ref 70–99)
Hematocrit: 19.6 % — ABNORMAL LOW (ref 34.0–46.6)
Hemoglobin: 6.9 g/dL — CL (ref 11.1–15.9)
Immature Grans (Abs): 0 10*3/uL (ref 0.0–0.1)
Immature Granulocytes: 1 %
Lymphocytes Absolute: 3.2 10*3/uL — ABNORMAL HIGH (ref 0.7–3.1)
Lymphs: 41 %
MCH: 33.8 pg — ABNORMAL HIGH (ref 26.6–33.0)
MCHC: 35.2 g/dL (ref 31.5–35.7)
MCV: 96 fL (ref 79–97)
Monocytes Absolute: 0.8 10*3/uL (ref 0.1–0.9)
Monocytes: 11 %
NRBC: 4 % — ABNORMAL HIGH (ref 0–0)
Neutrophils Absolute: 3.5 10*3/uL (ref 1.4–7.0)
Neutrophils: 44 %
Platelets: 393 10*3/uL (ref 150–450)
Potassium: 4.1 mmol/L (ref 3.5–5.2)
RBC: 2.04 x10E6/uL — CL (ref 3.77–5.28)
RDW: 22.1 % — ABNORMAL HIGH (ref 11.7–15.4)
Retic Ct Pct: 11.3 % — ABNORMAL HIGH (ref 0.6–2.6)
Sodium: 137 mmol/L (ref 134–144)
Total Protein: 7.1 g/dL (ref 6.0–8.5)
Vit D, 25-Hydroxy: 10.7 ng/mL — ABNORMAL LOW (ref 30.0–100.0)
WBC: 7.6 10*3/uL (ref 3.4–10.8)
eGFR: 146 mL/min/{1.73_m2} (ref >59)

## 2021-11-10 LAB — HEPATIC FUNCTION PANEL: Bilirubin, Direct: 0.61 mg/dL — ABNORMAL HIGH (ref 0.00–0.40)

## 2021-11-14 ENCOUNTER — Telehealth (HOSPITAL_COMMUNITY): Payer: Self-pay | Admitting: General Practice

## 2021-11-14 NOTE — Telephone Encounter (Signed)
Leafy Kindle [facility] Advocate ) called on behalf of the patient. Patient is "weak", has "headaches". Wanted to come to the day hospital for treatment. Per provider the [facility] is already full today. Patient told to stay hydrated with 64 ounces of water a day, take her prescribed medications and to go the ER to be seen. The advocate verbalized understanding.

## 2021-11-15 ENCOUNTER — Emergency Department (HOSPITAL_COMMUNITY): Payer: Medicaid Other

## 2021-11-15 ENCOUNTER — Other Ambulatory Visit: Payer: Self-pay

## 2021-11-15 ENCOUNTER — Encounter (HOSPITAL_COMMUNITY): Payer: Self-pay

## 2021-11-15 ENCOUNTER — Observation Stay (HOSPITAL_COMMUNITY)
Admission: EM | Admit: 2021-11-15 | Discharge: 2021-11-16 | Disposition: A | Discharge status: Home / Self Care | Payer: Medicaid Other | Attending: Internal Medicine | Admitting: Internal Medicine

## 2021-11-15 DIAGNOSIS — R531 Weakness: Principal | ICD-10-CM

## 2021-11-15 DIAGNOSIS — D5700 Hb-SS disease with crisis, unspecified: Secondary | ICD-10-CM | POA: Diagnosis not present

## 2021-11-15 DIAGNOSIS — J811 Chronic pulmonary edema: Secondary | ICD-10-CM | POA: Diagnosis not present

## 2021-11-15 DIAGNOSIS — D649 Anemia, unspecified: Principal | ICD-10-CM | POA: Diagnosis present

## 2021-11-15 DIAGNOSIS — D638 Anemia in other chronic diseases classified elsewhere: Secondary | ICD-10-CM | POA: Diagnosis present

## 2021-11-15 DIAGNOSIS — Z79899 Other long term (current) drug therapy: Secondary | ICD-10-CM | POA: Diagnosis not present

## 2021-11-15 DIAGNOSIS — R079 Chest pain, unspecified: Secondary | ICD-10-CM | POA: Diagnosis not present

## 2021-11-15 DIAGNOSIS — Z8613 Personal history of malaria: Secondary | ICD-10-CM

## 2021-11-15 DIAGNOSIS — R5383 Other fatigue: Secondary | ICD-10-CM | POA: Diagnosis not present

## 2021-11-15 DIAGNOSIS — D571 Sickle-cell disease without crisis: Secondary | ICD-10-CM | POA: Diagnosis present

## 2021-11-15 DIAGNOSIS — R0602 Shortness of breath: Secondary | ICD-10-CM | POA: Diagnosis not present

## 2021-11-15 DIAGNOSIS — Z9081 Acquired absence of spleen: Secondary | ICD-10-CM

## 2021-11-15 LAB — CBC WITH DIFFERENTIAL/PLATELET
Abs Immature Granulocytes: 0.05 10*3/uL (ref 0.00–0.07)
Basophils Absolute: 0.1 10*3/uL (ref 0.0–0.1)
Basophils Relative: 1 %
Eosinophils Absolute: 0.1 10*3/uL (ref 0.0–0.5)
Eosinophils Relative: 1 %
HCT: 17.7 % — ABNORMAL LOW (ref 36.0–46.0)
Hemoglobin: 6.6 g/dL — CL (ref 12.0–15.0)
Immature Granulocytes: 1 %
Lymphocytes Relative: 45 %
Lymphs Abs: 3.9 10*3/uL (ref 0.7–4.0)
MCH: 33.7 pg (ref 26.0–34.0)
MCHC: 37.3 g/dL — ABNORMAL HIGH (ref 30.0–36.0)
MCV: 90.3 fL (ref 80.0–100.0)
Monocytes Absolute: 1 10*3/uL (ref 0.1–1.0)
Monocytes Relative: 11 %
Neutro Abs: 3.5 10*3/uL (ref 1.7–7.7)
Neutrophils Relative %: 41 %
Platelets: 367 10*3/uL (ref 150–400)
RBC: 1.96 MIL/uL — ABNORMAL LOW (ref 3.87–5.11)
RDW: 25.5 % — ABNORMAL HIGH (ref 11.5–15.5)
WBC: 8.5 10*3/uL (ref 4.0–10.5)
nRBC: 0.5 % — ABNORMAL HIGH (ref 0.0–0.2)

## 2021-11-15 LAB — I-STAT BETA HCG BLOOD, ED (MC, WL, AP ONLY): I-stat hCG, quantitative: <5 m[IU]/mL (ref <5)

## 2021-11-15 LAB — RETICULOCYTES
Immature Retic Fract: 40.9 % — ABNORMAL HIGH (ref 2.3–15.9)
RBC.: 2.2 MIL/uL — ABNORMAL LOW (ref 3.87–5.11)
Retic Count, Absolute: 155 10*3/uL (ref 19.0–186.0)
Retic Ct Pct: 7.1 % — ABNORMAL HIGH (ref 0.4–3.1)

## 2021-11-15 LAB — COMPREHENSIVE METABOLIC PANEL
ALT: 7 U/L (ref 0–44)
AST: 32 U/L (ref 15–41)
Albumin: 4.2 g/dL (ref 3.5–5.0)
Alkaline Phosphatase: 74 U/L (ref 38–126)
Anion gap: 5 (ref 5–15)
BUN: 8 mg/dL (ref 6–20)
CO2: 21 mmol/L — ABNORMAL LOW (ref 22–32)
Calcium: 8.9 mg/dL (ref 8.9–10.3)
Chloride: 113 mmol/L — ABNORMAL HIGH (ref 98–111)
Creatinine, Ser: 0.38 mg/dL — ABNORMAL LOW (ref 0.44–1.00)
GFR, Estimated: >60 mL/min (ref >60)
Glucose, Bld: 93 mg/dL (ref 70–99)
Potassium: 3.7 mmol/L (ref 3.5–5.1)
Sodium: 139 mmol/L (ref 135–145)
Total Bilirubin: 2 mg/dL — ABNORMAL HIGH (ref 0.3–1.2)
Total Protein: 7.4 g/dL (ref 6.5–8.1)

## 2021-11-15 LAB — PREPARE RBC (CROSSMATCH)

## 2021-11-15 MED ORDER — ACETAMINOPHEN 325 MG PO TABS
650.0000 mg | ORAL_TABLET | Freq: Four times a day (QID) | ORAL | Status: DC | PRN
  Administered 2021-11-15: 650 mg via ORAL
  Filled 2021-11-15: qty 2

## 2021-11-15 MED ORDER — SODIUM CHLORIDE 0.9% IV SOLUTION: Freq: Once | INTRAVENOUS | Status: DC

## 2021-11-15 MED ORDER — KETOROLAC TROMETHAMINE 15 MG/ML IJ SOLN
15.0000 mg | Freq: Four times a day (QID) | INTRAMUSCULAR | Status: DC | PRN
  Administered 2021-11-15: 15 mg via INTRAVENOUS
  Filled 2021-11-15 (×2): qty 1

## 2021-11-15 MED ORDER — SODIUM CHLORIDE 0.9 % IV SOLN: INTRAVENOUS | Status: DC

## 2021-11-15 MED ORDER — HYDROXYUREA 100 MG/ML ORAL SUSPENSION: 1200.0000 mg | Freq: Every day | ORAL | Status: DC

## 2021-11-15 MED ORDER — SODIUM CHLORIDE 0.45 % IV SOLN: INTRAVENOUS | Status: DC

## 2021-11-15 MED ORDER — PROPRANOLOL HCL 20 MG PO TABS
20.0000 mg | ORAL_TABLET | Freq: Every day | ORAL | Status: DC
  Filled 2021-11-15: qty 1

## 2021-11-15 MED ORDER — SENNOSIDES-DOCUSATE SODIUM 8.6-50 MG PO TABS
1.0000 | ORAL_TABLET | Freq: Two times a day (BID) | ORAL | Status: DC
  Administered 2021-11-15: 1 via ORAL
  Filled 2021-11-15 (×2): qty 1

## 2021-11-15 MED ORDER — FOLIC ACID 1 MG PO TABS
1.0000 mg | ORAL_TABLET | Freq: Every day | ORAL | Status: DC
  Administered 2021-11-15 – 2021-11-16 (×2): 1 mg via ORAL
  Filled 2021-11-15 (×2): qty 1

## 2021-11-15 MED ORDER — VOXELOTOR 500 MG PO TABS: 1500.0000 mg | ORAL_TABLET | Freq: Every day | ORAL | Status: DC

## 2021-11-15 MED ORDER — OXYCODONE-ACETAMINOPHEN 5-325 MG PO TABS: 1.0000 | ORAL_TABLET | ORAL | Status: DC | PRN

## 2021-11-15 MED ORDER — POLYETHYLENE GLYCOL 3350 17 G PO PACK: 17.0000 g | PACK | Freq: Every day | ORAL | Status: DC | PRN

## 2021-11-15 NOTE — ED Triage Notes (Signed)
Pt to er, pt states that she is here for sickle cell pain, states that she has had a headache for the past week, and two nose bleeds in the past 24 hours, states that she also has general weakness.  Pt denies chest pain.

## 2021-11-15 NOTE — ED Notes (Signed)
Pt placed on [place] @ 2 lpm

## 2021-11-15 NOTE — ED Provider Notes (Signed)
[facility] DEPT Provider Note   CSN: [id number] Arrival date & time: 11/15/21  0901     History  Chief Complaint  Patient presents with   Weakness   Sickle Cell Pain Crisis    Catlin Doria is a 21 y.o. female.  21 year old female who presents with diffuse weakness related to her sickle cell disease.  States that it is similar to her prior sickle cell crisis.  States that she is actually not in severe pain at this time.  Feels that she may need admission.  Has been slightly short of breath.  No fever cough or congestion.  No abdominal discomfort.  Has been compliant with her medications at home       Home Medications Prior to Admission medications   Medication Sig Start Date End Date Taking? Authorizing Provider  acetaminophen (TYLENOL) 325 MG tablet Take 650 mg by mouth every 6 (six) hours as needed for moderate pain or mild pain.    [provider]  folic acid (FOLVITE) 1 MG tablet Take 1 tablet (1 mg total) by mouth daily. 05/11/21 05/11/22  Dorena Dew, FNP  hydroxyurea (HYDREA) 100 mg/mL SUSP Take 1,200 mg by mouth daily.    [provider]  ibuprofen (ADVIL) 200 MG tablet Take 400 mg by mouth every 6 (six) hours as needed for moderate pain or mild pain.    [provider]  oxyCODONE-acetaminophen (PERCOCET) 5-325 MG tablet Take 1 tablet by mouth every 4 (four) hours as needed for severe pain. 05/11/21 05/11/22  Dorena Dew, FNP  propranolol (INDERAL) 20 MG tablet Take 1 tablet (20 mg total) by mouth daily. 04/28/21   Dorena Dew, FNP  Vitamin D, Ergocalciferol, (DRISDOL) 1.25 MG (50000 UNIT) CAPS capsule Take 1 capsule (50,000 Units total) by mouth every 7 (seven) days. 11/09/21   Fenton Foy, NP  voxelotor (OXBRYTA) 500 MG TABS tablet Take 1,500 mg by mouth daily. 10/19/20   [provider]      Allergies    Patient has no known allergies.    Review of Systems   Review of Systems  All other  systems reviewed and are negative.   Physical Exam Updated Vital Signs BP 105/68   Pulse 87   Temp 98.3 F (36.8 C) (Oral)   Resp 16   Ht 1.549 m ('5\' 1"'$ )   Wt 49.9 kg   LMP 10/30/2021   SpO2 90%   BMI 20.78 kg/m  Physical Exam Vitals and nursing note reviewed.  Constitutional:      General: She is not in acute distress.    Appearance: Normal appearance. She is well-developed. She is not toxic-appearing.  HENT:     Head: Normocephalic and atraumatic.  Eyes:     General: Lids are normal.     Conjunctiva/sclera: Conjunctivae normal.     Pupils: Pupils are equal, round, and reactive to light.  Neck:     Thyroid: No thyroid mass.     Trachea: No tracheal deviation.  Cardiovascular:     Rate and Rhythm: Normal rate and regular rhythm.     Heart sounds: Normal heart sounds. No murmur heard.    No gallop.  Pulmonary:     Effort: Pulmonary effort is normal. No respiratory distress.     Breath sounds: Normal breath sounds. No stridor. No decreased breath sounds, wheezing, rhonchi or rales.  Abdominal:     General: There is no distension.     Palpations: Abdomen is  soft.     Tenderness: There is no abdominal tenderness. There is no rebound.  Musculoskeletal:        General: No tenderness. Normal range of motion.     Cervical back: Normal range of motion and neck supple.  Skin:    General: Skin is warm and dry.     Findings: No abrasion or rash.  Neurological:     Mental Status: She is alert and oriented to person, place, and time. Mental status is at baseline.     GCS: GCS eye subscore is 4. GCS verbal subscore is 5. GCS motor subscore is 6.     Cranial Nerves: No cranial nerve deficit.     Sensory: No sensory deficit.     Motor: Motor function is intact.  Psychiatric:        Attention and Perception: Attention normal.        Speech: Speech normal.        Behavior: Behavior normal.     ED Results / Procedures / Treatments   Labs (all labs ordered are listed, but  only abnormal results are displayed) Labs Reviewed  COMPREHENSIVE METABOLIC PANEL  CBC WITH DIFFERENTIAL/PLATELET  RETICULOCYTES  I-STAT BETA HCG BLOOD, ED ([facility], [facility], [facility] ONLY)    EKG None  Radiology DG Chest 2 View  Result Date: 11/15/2021 CLINICAL DATA:  Chest pain, shortness of breath, sickle cell disease, epistaxis twice in past 24 hours, generalized weakness EXAM: CHEST - 2 VIEW COMPARISON:  05/03/2021 FINDINGS: Enlargement of cardiac silhouette with pulmonary vascular congestion. Mediastinal contours normal. Minimal LEFT mid lung scarring. Lungs otherwise clear. No pulmonary infiltrate, pleural effusion, or pneumothorax. Dextroconvex thoracic scoliosis. No acute osseous findings. Surgical clips RIGHT upper quadrant question prior cholecystectomy. IMPRESSION: Enlargement of cardiac silhouette with pulmonary vascular congestion consistent with history of sickle cell disease. LEFT mid lung scarring. No acute abnormalities. Electronically Signed   By: Lavonia Dana M.D.   On: 11/15/2021 10:00    Procedures Procedures    Medications Ordered in ED Medications  0.9 %  sodium chloride infusion (has no administration in time range)    ED Course/ Medical Decision Making/ A&P                           Medical Decision Making Amount and/or Complexity of Data Reviewed Labs: ordered.  Risk Prescription drug management.   Patient presents complaining of weakness.  Suspect possible severe anemia.  Patient hemoglobin is decreased from her baseline.  Will require transfusion with packed red blood cells.  Patient states that she has no pain at this time.  Was offered pain medication has deferred.  Was given IV fluids here.  No evidence that this is a sickle cell crisis.  But will be admitted for observation.  Discussed with sickle cell team and they will come and see the patient  CRITICAL CARE Performed by: Leota Jacobsen Total critical care time: 45 minutes Critical care time was exclusive  of separately billable procedures and treating other patients. Critical care was necessary to treat or prevent imminent or life-threatening deterioration. Critical care was time spent personally by me on the following activities: development of treatment plan with patient and/or surrogate as well as nursing, discussions with consultants, evaluation of patient's response to treatment, examination of patient, obtaining history from patient or surrogate, ordering and performing treatments and interventions, ordering and review of laboratory studies, ordering and review of radiographic studies, pulse oximetry and  re-evaluation of patient's condition.         Final Clinical Impression(s) / ED Diagnoses Final diagnoses:  None    Rx / DC Orders ED Discharge Orders     None         Lacretia Leigh, MD 11/15/21 1432

## 2021-11-15 NOTE — ED Provider Triage Note (Signed)
Emergency Medicine Provider Triage Evaluation Note  Alicia Villegas , a 21 y.o. female  was evaluated in triage.  Pt complains of weakness, sickle cell crisis. She states that she has been feeling weak over the past 1 week. Endorses intermittent chest pain and shortness of breath. Denies any pain currently. Endorses compliance with her home meds. Denies fevers, chills, nausea, vomiting, or diarrhea. States that she usually feels this way with changing seasons.  Review of Systems  Positive:  Negative:   Physical Exam  BP 105/68   Pulse 87   Temp 98.3 F (36.8 C) (Oral)   Resp 16   Ht '5\' 1"'$  (1.549 m)   Wt 49.9 kg   LMP 10/30/2021   SpO2 90%   BMI 20.78 kg/m  Gen:   Awake, no distress   Resp:  Normal effort  MSK:   Moves extremities without difficulty Other:    Medical Decision Making  Medically screening exam initiated at 9:43 AM.  Appropriate orders placed.  [name] was informed that the remainder of the evaluation will be completed by another provider, this initial triage assessment does not replace that evaluation, and the importance of remaining in the ED until their evaluation is complete.     Bud Face, PA-C 11/15/21 0945

## 2021-11-15 NOTE — Progress Notes (Signed)
Received a call from blood bank initially and they said 2 units of blood has to come from [place] and it will be 2 days. Writer spoke with the provider and received verbal order to request 1 unit of blood. Writer called blood bank second time and one of the staff said that 2 units of blood is available in the [facility] [place]. We will be receiving 2 units early in the morning. Pt updated.

## 2021-11-16 DIAGNOSIS — D649 Anemia, unspecified: Secondary | ICD-10-CM

## 2021-11-16 LAB — CBC
HCT: 18 % — ABNORMAL LOW (ref 36.0–46.0)
Hemoglobin: 6.6 g/dL — CL (ref 12.0–15.0)
MCH: 33.3 pg (ref 26.0–34.0)
MCHC: 36.7 g/dL — ABNORMAL HIGH (ref 30.0–36.0)
MCV: 90.9 fL (ref 80.0–100.0)
Platelets: 352 10*3/uL (ref 150–400)
RBC: 1.98 MIL/uL — ABNORMAL LOW (ref 3.87–5.11)
RDW: 25.8 % — ABNORMAL HIGH (ref 11.5–15.5)
WBC: 7.5 10*3/uL (ref 4.0–10.5)
nRBC: 0.3 % — ABNORMAL HIGH (ref 0.0–0.2)
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
## 2021-11-17 LAB — TYPE AND SCREEN
ABO/RH(D): AB POS
Antibody Screen: POSITIVE
Unit division: 0
Unit division: 0

## 2021-11-17 LAB — BPAM RBC
Blood Product Expiration Date: 202311152359
Blood Product Expiration Date: 202311192359
ISSUE DATE / TIME: 202310121048
ISSUE DATE / TIME: 202310121347
Unit Type and Rh: 600
Unit Type and Rh: 600

## 2021-11-17 NOTE — H&P (Signed)
H&P  Patient Demographics:  Alicia Villegas, is a 21 y.o. female  MRN: [id number]   DOB - 10-09-2000  Admit Date - 11/15/2021  Outpatient Primary MD for the patient is Dorena Dew, FNP  Chief Complaint  Patient presents with   Weakness   Sickle Cell Pain Crisis      HPI:   Alicia Villegas  is a 21 y.o. female with a medical history significant for sickle cell disease, anemia of chronic disease, and history of acute chest syndrome presents with complaints of fatigue over the past several days.  Patient states that fatigue has been worsening over the past several days.  She attributed to increase stressors at school.  Patient states that fatigue did not improve with increased hydration, folic acid, and Oxbryta.  Patient is not having any pain at this time. She denies headache, chest pain, urinary symptoms, nausea, vomiting, or diarrhea.  She endorses dizziness and shortness of breath with exertion.  No fever, chills, urinary symptoms, nausea, vomiting, or diarrhea.  No recent travel, sick contacts, or known exposure to COVID-19.  ER course: While in ER, patient's vital signs remained stable. Reviewed all laboratory values, WBCs 8.5, hemoglobin 6.6, and platelets 367,000.  Complete metabolic panel shows bilirubin elevated at 2, otherwise unremarkable.  Patient is not having any pain at this time.  She will be admitted for symptomatic anemia.  Review of systems:  In addition to the HPI above, patient reports No fever or chills No Headache, No changes with vision or hearing No problems swallowing food or liquids No chest pain, cough or shortness of breath No abdominal pain, No nausea or vomiting, Bowel movements are regular No blood in stool or urine No dysuria No new skin rashes or bruises No new joints pains-aches No new weakness, tingling, numbness in any extremity No recent weight gain or loss No polyuria, polydypsia or polyphagia No significant Mental Stressors  With Past  History of the following :   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 Authorizing Provider  acetaminophen (TYLENOL) 325 MG tablet Take 650 mg by mouth every 6 (six) hours as needed for mild pain.   Yes [provider]  folic acid (FOLVITE) 1 MG tablet Take 1 tablet (1 mg total) by mouth daily. 05/11/21 05/11/22 Yes Dorena Dew, FNP  hydroxyurea (HYDREA) 100 mg/mL SUSP Take 1,200 mg by mouth daily.   Yes [provider]  ibuprofen (ADVIL) 200 MG tablet Take 400 mg by mouth every 6 (six) hours as needed for mild pain.   Yes [provider]  Vitamin D, Ergocalciferol, (DRISDOL) 1.25 MG (50000 UNIT) CAPS capsule Take 1 capsule (50,000 Units total) by mouth every 7 (seven) days. 11/09/21  Yes Fenton Foy, NP  voxelotor (OXBRYTA) 500 MG TABS tablet Take 1,500 mg by mouth daily. 10/19/20  Yes [provider]  oxyCODONE-acetaminophen (PERCOCET) 5-325 MG tablet Take 1 tablet by mouth every 4 (four) hours as needed for severe pain. Patient not taking: Reported on 11/15/2021 05/11/21 05/11/22  Dorena Dew, FNP  propranolol (INDERAL) 20 MG tablet Take 1 tablet (20 mg total) by mouth daily. Patient not taking: Reported on 11/15/2021 04/28/21    Dorena Dew, FNP     Allergies:   No Known Allergies   Physical Exam:   Vitals:   Vitals:   11/16/21 1412 11/16/21 1637  BP: 106/71 111/74  Pulse: 69 76  Resp: 16   Temp: 98.5 F (36.9 C) 98.7 F (37.1 C)  SpO2:  97%    Physical Exam: Constitutional: Patient appears well-developed and well-nourished. Not in obvious distress. HENT: Normocephalic, atraumatic, External right and left ear normal. Oropharynx is clear and moist.  Eyes: Conjunctivae and EOM are normal. PERRLA, no scleral icterus. Neck: Normal ROM. Neck supple. No JVD. No tracheal deviation. No thyromegaly. CVS: RRR, S1/S2 +, no murmurs, no gallops, no carotid bruit.  Pulmonary: Effort and breath sounds normal, no stridor, rhonchi, wheezes, rales.  Abdominal: Soft. BS +, no distension, tenderness, rebound or guarding.  Musculoskeletal: Normal range of motion. No edema and no tenderness.  Lymphadenopathy: No lymphadenopathy noted, cervical, inguinal or axillary Neuro: Alert. Normal reflexes, muscle tone coordination. No cranial nerve deficit. Skin: Skin is warm and dry. No rash noted. Not diaphoretic. No erythema. No pallor. Psychiatric: Normal mood and affect. Behavior, judgment, thought content normal.   Data Review:   CBC Recent Labs  Lab 11/15/21 1239 11/16/21 0828  WBC 8.5 7.5  HGB 6.6* 6.6*  HCT 17.7* 18.0*  PLT 367 352  MCV 90.3 90.9  MCH 33.7 33.3  MCHC 37.3* 36.7*  RDW 25.5* 25.8*  LYMPHSABS 3.9  --   MONOABS 1.0  --   EOSABS 0.1  --   BASOSABS 0.1  --    ------------------------------------------------------------------------------------------------------------------  Chemistries  Recent Labs  Lab 11/15/21 1239  NA 139  K 3.7  CL 113*  CO2 21*  GLUCOSE 93  BUN 8  CREATININE 0.38*  CALCIUM 8.9  AST 32  ALT 7  ALKPHOS 74  BILITOT 2.0*   ------------------------------------------------------------------------------------------------------------------ estimated creatinine  clearance is 85.4 mL/min (A) (by C-G formula based on SCr of 0.38 mg/dL (L)). ------------------------------------------------------------------------------------------------------------------ No results for input(s): "TSH", "T4TOTAL", "T3FREE", "THYROIDAB" in the last 72 hours.  Invalid input(s): "FREET3"  Coagulation profile No results for input(s): "INR", "PROTIME" in the last 168 hours. ------------------------------------------------------------------------------------------------------------------- No results for input(s): "DDIMER" in the last 72 hours. -------------------------------------------------------------------------------------------------------------------  Cardiac Enzymes No results for input(s): "CKMB", "TROPONINI", "MYOGLOBIN" in the last 168 hours.  Invalid input(s): "CK" ------------------------------------------------------------------------------------------------------------------ No results found for: "BNP"  ---------------------------------------------------------------------------------------------------------------  Urinalysis    Component Value Date/Time   COLORURINE AMBER (A) 03/09/2021 0000   APPEARANCEUR HAZY (A) 03/09/2021 0000   LABSPEC 1.020 03/09/2021 0000   PHURINE 6.0 03/09/2021 0000   GLUCOSEU NEGATIVE 03/09/2021 0000   HGBUR LARGE (A) 03/09/2021 0000   BILIRUBINUR small (A) 04/11/2021 1241   BILIRUBINUR 2+ 05/09/2020 1537   KETONESUR negative 04/11/2021 1241   KETONESUR NEGATIVE 03/09/2021 0000   PROTEINUR NEGATIVE 03/09/2021 0000   UROBILINOGEN 2.0 (  A) 04/11/2021 1241   UROBILINOGEN 1.0 01/13/2014 1612   NITRITE Negative 04/11/2021 1241   NITRITE NEGATIVE 03/09/2021 0000   LEUKOCYTESUR Large (3+) (A) 04/11/2021 1241   LEUKOCYTESUR SMALL (A) 03/09/2021 0000    ----------------------------------------------------------------------------------------------------------------   Imaging Results:    DG Chest 2 View  Result Date:  11/15/2021 CLINICAL DATA:  Chest pain, shortness of breath, sickle cell disease, epistaxis twice in past 24 hours, generalized weakness EXAM: CHEST - 2 VIEW COMPARISON:  05/03/2021 FINDINGS: Enlargement of cardiac silhouette with pulmonary vascular congestion. Mediastinal contours normal. Minimal LEFT mid lung scarring. Lungs otherwise clear. No pulmonary infiltrate, pleural effusion, or pneumothorax. Dextroconvex thoracic scoliosis. No acute osseous findings. Surgical clips RIGHT upper quadrant question prior cholecystectomy. IMPRESSION: Enlargement of cardiac silhouette with pulmonary vascular congestion consistent with history of sickle cell disease. LEFT mid lung scarring. No acute abnormalities. Electronically Signed   By: Lavonia Dana M.D.   On: 11/15/2021 10:00     Assessment & Plan:  Principal Problem:   Symptomatic anemia Active Problems:   Sickle cell disease, type SS ([name])   Sickle cell pain crisis ([name])   Anemia of chronic disease   Symptomatic anemia: Patient reports fatigue and some shortness of breath with exertion.  We will admit and administer 2 units of packed red blood cells overnight.  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  Blood is arriving from [place] on tomorrow. Patient is not having any pain at this time Monitor vital signs closely and supplemental oxygen as needed.  Sickle cell disease. Patient is not having any pain at this time.  No pain crisis.  We will continue patient's home medications Toradol 15 mg IV every 6 hours as needed for mild to moderate pain  DVT Prophylaxis: Subcut Lovenox   AM Labs Ordered, also please review Full Orders  Family Communication: Admission, patient's condition and plan of care including tests being ordered have been discussed with the patient who indicate understanding and agree with the plan and Code Status.  Code Status: Full Code  Consults called: None    Admission status: Inpatient    Time spent  in minutes : 30 minutes  [place], MSN, FNP-C Patient [facility] [address] [place], [place] [address] [phone]  11/17/2021 at 12:18 AM

## 2021-11-17 NOTE — Discharge Summary (Addendum)
Physician Discharge Summary  [name] CBJ:[id number] DOB: [birth year] DOA: 11/15/2021  PCP: Dorena Dew, FNP  Admit date: 11/15/2021  Discharge date: 11/16/2021  Discharge Diagnoses:  Principal Problem:   Symptomatic anemia Active Problems:   Sickle cell disease, type SS ([name])   Sickle cell pain crisis ([place])   Anemia of chronic disease   Discharge Condition: Stable  Disposition:   Follow-up Information     Dorena Dew, FNP Follow up on 11/21/2021.   Specialty: Family Medicine Contact information: [place]. [place] [address] [phone]                Pt is discharged home in good condition and is to follow up with Dorena Dew, FNP this week to have labs evaluated. [name] is instructed to increase activity slowly and balance with rest for the next few days, and use prescribed medication to complete treatment of pain  Diet: Regular Wt Readings from Last 3 Encounters:  11/15/21 49.9 kg (15 %, Z= -1.04)*  11/08/21 50.6 kg (18 %, Z= -0.93)*  05/03/21 50.3 kg (17 %, Z= -0.94)*   * Growth percentiles are based on CDC (Girls, 2-20 Years) data.  History of present illness: Alicia Villegas  is a 21 y.o. female with a medical history significant for sickle cell disease, anemia of chronic disease, and history of acute chest syndrome presents with complaints of fatigue over the past several days.  Patient states that fatigue has been worsening over the past several days.  She attributed to increase stressors at school.  Patient states that fatigue did not improve with increased hydration, folic acid, and Oxbryta.  Patient is not having any pain at this time. She denies headache, chest pain, urinary symptoms, nausea, vomiting, or diarrhea.  She endorses dizziness and shortness of breath with exertion.  No fever, chills, urinary symptoms, nausea, vomiting, or diarrhea.  No recent travel, sick contacts, or known exposure to COVID-19.    ER course: While in ER, patient's vital signs remained stable. Reviewed all laboratory values, WBCs 8.5, hemoglobin 6.6, and platelets 367,000.  Complete metabolic panel shows bilirubin elevated at 2, otherwise unremarkable.  Patient is not having any pain at this time.  She will be admitted for symptomatic anemia.  Hospital Course:  Patient admitted for symptomatic anemia.  Prior to admission, patient endorsed fatigue, shortness of breath and some dizziness.  Hemoglobin was found to be 6.6 g/dL, which is below patient's baseline of 8-9.  Patient was transfused a total of 2 units PRBCs. She will follow-up in sickle cell day clinic on Tuesday, 11/21/2021. Patient is alert, oriented, and ambulating without assistance. Patient was therefore discharged home today in a hemodynamically stable condition.   Janitza will follow-up with PCP within 1 week of this discharge. Sarie was counseled extensively about nonpharmacologic means of pain management, patient verbalized understanding and was appreciative of  the care received during this admission.   We discussed the need for good hydration, monitoring of hydration status, avoidance of heat, cold, stress, and infection triggers. We discussed the need to be adherent with taking Hydrea and other home medications. Patient was reminded of the need to seek medical attention immediately if any symptom of bleeding, anemia, or infection occurs.  Discharge Exam: Vitals:   11/16/21 1412 11/16/21 1637  BP: 106/71 111/74  Pulse: 69 76  Resp: 16   Temp: 98.5 F (36.9 C) 98.7 F (37.1 C)  SpO2:  97%   Vitals:  11/16/21 1113 11/16/21 1346 11/16/21 1412 11/16/21 1637  BP: 95/62 101/70 106/71 111/74  Pulse: 79 76 69 76  Resp: '16 18 16   '$ Temp: 98.4 F (36.9 C) 98.4 F (36.9 C) 98.5 F (36.9 C) 98.7 F (37.1 C)  TempSrc: Oral Oral Oral Oral  SpO2:  95%  97%  Weight:      Height:        General appearance : Awake, alert, not in any distress. Speech  Clear. Not toxic looking HEENT: Atraumatic and Normocephalic, pupils equally reactive to light and accomodation Neck: Supple, no JVD. No cervical lymphadenopathy.  Chest: Good air entry bilaterally, no added sounds  CVS: S1 S2 regular, no murmurs.  Abdomen: Bowel sounds present, Non tender and not distended with no gaurding, rigidity or rebound. Extremities: B/L Lower Ext shows no edema, both legs are warm to touch Neurology: Awake alert, and oriented X 3, CN II-XII intact, Non focal Skin: No Rash  Discharge Instructions  Discharge Instructions     Discharge patient   Complete by: As directed    Discharge disposition: 01-Home or Self Care   Discharge patient date: 11/16/2021      Allergies as of 11/16/2021   No Known Allergies      Medication List     TAKE these medications    acetaminophen 325 MG tablet Commonly known as: TYLENOL Take 650 mg by mouth every 6 (six) hours as needed for mild pain.   folic acid 1 MG tablet Commonly known as: FOLVITE Take 1 tablet (1 mg total) by mouth daily.   hydroxyurea 100 mg/mL Susp Commonly known as: HYDREA Take 1,200 mg by mouth daily.   ibuprofen 200 MG tablet Commonly known as: ADVIL Take 400 mg by mouth every 6 (six) hours as needed for mild pain.   oxyCODONE-acetaminophen 5-325 MG tablet Commonly known as: Percocet Take 1 tablet by mouth every 4 (four) hours as needed for severe pain.   propranolol 20 MG tablet Commonly known as: INDERAL Take 1 tablet (20 mg total) by mouth daily.   Vitamin D (Ergocalciferol) 1.25 MG (50000 UNIT) Caps capsule Commonly known as: DRISDOL Take 1 capsule (50,000 Units total) by mouth every 7 (seven) days.   voxelotor 500 MG Tabs tablet Commonly known as: OXBRYTA Take 1,500 mg by mouth daily.        The results of significant diagnostics from this hospitalization (including imaging, microbiology, ancillary and laboratory) are listed below for reference.    Significant Diagnostic  Studies: DG Chest 2 View  Result Date: 11/15/2021 CLINICAL DATA:  Chest pain, shortness of breath, sickle cell disease, epistaxis twice in past 24 hours, generalized weakness EXAM: CHEST - 2 VIEW COMPARISON:  05/03/2021 FINDINGS: Enlargement of cardiac silhouette with pulmonary vascular congestion. Mediastinal contours normal. Minimal LEFT mid lung scarring. Lungs otherwise clear. No pulmonary infiltrate, pleural effusion, or pneumothorax. Dextroconvex thoracic scoliosis. No acute osseous findings. Surgical clips RIGHT upper quadrant question prior cholecystectomy. IMPRESSION: Enlargement of cardiac silhouette with pulmonary vascular congestion consistent with history of sickle cell disease. LEFT mid lung scarring. No acute abnormalities. Electronically Signed   By: Lavonia Dana M.D.   On: 11/15/2021 10:00    Microbiology: No results found for this or any previous visit (from the past 240 hour(s)).   Labs: Basic Metabolic Panel: Recent Labs  Lab 11/15/21 1239  NA 139  K 3.7  CL 113*  CO2 21*  GLUCOSE 93  BUN 8  CREATININE 0.38*  CALCIUM 8.9  Liver Function Tests: Recent Labs  Lab 11/15/21 1239  AST 32  ALT 7  ALKPHOS 74  BILITOT 2.0*  PROT 7.4  ALBUMIN 4.2   No results for input(s): "LIPASE", "AMYLASE" in the last 168 hours. No results for input(s): "AMMONIA" in the last 168 hours. CBC: Recent Labs  Lab 11/15/21 1239 11/16/21 0828  WBC 8.5 7.5  NEUTROABS 3.5  --   HGB 6.6* 6.6*  HCT 17.7* 18.0*  MCV 90.3 90.9  PLT 367 352   Cardiac Enzymes: No results for input(s): "CKTOTAL", "CKMB", "CKMBINDEX", "TROPONINI" in the last 168 hours. BNP: Invalid input(s): "POCBNP" CBG: No results for input(s): "GLUCAP" in the last 168 hours.  Time coordinating discharge: 52 minutes  Signed:  Donia Pounds  APRN, MSN, FNP-C [facility] [address] [place], [place] [address] [phone]  [facility] 11/17/2021, 12:28 AM

## 2021-11-20 ENCOUNTER — Other Ambulatory Visit: Payer: Self-pay | Admitting: Family Medicine

## 2021-11-20 DIAGNOSIS — D571 Sickle-cell disease without crisis: Secondary | ICD-10-CM

## 2021-11-23 ENCOUNTER — Non-Acute Institutional Stay (HOSPITAL_COMMUNITY)
Admission: RE | Admit: 2021-11-23 | Discharge: 2021-11-23 | Disposition: A | Discharge status: Home / Self Care | Payer: Medicaid Other | Source: Ambulatory Visit | Attending: Internal Medicine | Admitting: Internal Medicine

## 2021-11-23 DIAGNOSIS — D571 Sickle-cell disease without crisis: Secondary | ICD-10-CM

## 2021-11-23 NOTE — Progress Notes (Signed)
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 Phlebotomist attempted multiple time to draw patient's labs peripherally but was unsuccessful. Notified [place], [place] of unsuccessful lab draw. Patient advised to schedule PCP appointment with [place], [place] within 2 weeks.  Patient expresses an understanding.  Alert, oriented and ambulatory at discharge.

## 2021-12-12 ENCOUNTER — Ambulatory Visit: Payer: Self-pay | Admitting: Family Medicine

## 2021-12-19 ENCOUNTER — Telehealth: Payer: Medicaid Other | Admitting: Family Medicine

## 2021-12-21 ENCOUNTER — Ambulatory Visit (HOSPITAL_COMMUNITY)
Admission: RE | Admit: 2021-12-21 | Discharge: 2021-12-21 | Disposition: A | Discharge status: Home / Self Care | Payer: Medicaid Other | Source: Ambulatory Visit | Attending: Nurse Practitioner | Admitting: Nurse Practitioner

## 2021-12-21 ENCOUNTER — Encounter: Payer: Self-pay | Admitting: Nurse Practitioner

## 2021-12-21 ENCOUNTER — Ambulatory Visit (INDEPENDENT_AMBULATORY_CARE_PROVIDER_SITE_OTHER): Payer: Medicaid Other | Admitting: Nurse Practitioner

## 2021-12-21 VITALS — BP 107/70 | HR 72 | Temp 98.0°F | Ht 61.0 in | Wt 107.0 lb

## 2021-12-21 DIAGNOSIS — R1031 Right lower quadrant pain: Secondary | ICD-10-CM

## 2021-12-21 LAB — POCT URINALYSIS DIPSTICK
Bilirubin, UA: NEGATIVE
Glucose, UA: NEGATIVE
Ketones, UA: NEGATIVE
Leukocytes, UA: NEGATIVE
Nitrite, UA: NEGATIVE
Protein, UA: NEGATIVE
Spec Grav, UA: 1.01 (ref 1.010–1.025)
Urobilinogen, UA: 2 E.U./dL — AB
pH, UA: 5.5 (ref 5.0–8.0)

## 2021-12-21 MED ORDER — ONDANSETRON HCL 4 MG PO TABS: 4.0000 mg | ORAL_TABLET | Freq: Three times a day (TID) | ORAL | 0 refills | Status: DC | PRN

## 2021-12-21 MED ORDER — OMEPRAZOLE 20 MG PO CPDR: 20.0000 mg | DELAYED_RELEASE_CAPSULE | Freq: Every day | ORAL | 3 refills | Status: DC

## 2021-12-21 NOTE — Assessment & Plan Note (Signed)
-   POCT urinalysis dipstick - omeprazole (PRILOSEC) 20 MG capsule; Take 1 capsule (20 mg total) by mouth daily.  Dispense: 30 capsule; Refill: 3 - ondansetron (ZOFRAN) 4 MG tablet; Take 1 tablet (4 mg total) by mouth every 8 (eight) hours as needed for nausea or vomiting.  Dispense: 20 tablet; Refill: 0 - CBC - Comprehensive metabolic panel - US Abdomen Complete  Follow up:  Follow up as needed

## 2021-12-21 NOTE — Progress Notes (Signed)
_0  ID: Alicia Villegas, female    DOB: 12-May-2000, 21 y.o.   MRN: [id number]  Chief Complaint  Patient presents with   Abdominal Pain    Right upper abdominal--constant pain, fatigue,nausea-4 weeks    Referring provider: Dorena Dew, FNP  HPI  Alicia Villegas 21 y.o. 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   Patient presents today with abdominal pain.  She states that this has been an issue for the past 4 weeks.  She denies any issues with bowel or bladder.  She states that she has been having regular bowel movements.  Patient states that she is slightly dehydrated today.  She has been feeling nauseated but has not vomited.  She states that the pain is mainly in the right lower quadrant. Denies f/c/s, n/v/d, hemoptysis, PND, leg swelling Denies chest pain or edema      No Known Allergies  Immunization History  Administered Date(s) Administered   HPV 9-valent 12/01/2014, 10/09/2016   Hepatitis A 02/14/2011, 04/15/2012   Hepatitis A, Ped/Adol-2 Dose 01/17/2009, 09/05/2009, 02/14/2011, 04/15/2012   Hepatitis B 02/14/2011, 06/21/2011, 04/15/2012   Hepatitis B, PED/ADOLESCENT 01/17/2009, 02/25/2009, 09/05/2009, 02/14/2011, 06/21/2011, 04/15/2012   IPV 01/17/2009, 02/25/2009, 04/25/2009, 02/14/2011, 06/21/2011, 04/15/2012   Influenza Split 01/17/2009, 02/25/2009, 12/26/2009, 12/02/2012   Influenza, Seasonal, Injecte, Preservative Fre 12/01/2014   Influenza,inj,Quad PF,6+ Mos 12/01/2014, 11/18/2015, 01/19/2017, 01/09/2018, 11/08/2020   Influenza,inj,quad, With Preservative 11/12/2013   Influenza-Unspecified 02/14/2011, 04/15/2012, 11/12/2013   MMR 01/17/2009, 02/25/2009, 02/14/2011, 06/21/2011   Meningococcal Conjugate 09/06/2009, 01/17/2010, 12/01/2014, 09/06/2020   Pneumococcal Conjugate-13 04/27/2009, 05/20/2009,  08/24/2013   Pneumococcal Polysaccharide-23 01/17/2009, 06/17/2014   Td 02/14/2011, 06/21/2011, 04/15/2012   Tdap 01/17/2009, 02/25/2009, 01/17/2010, 01/17/2010, 02/14/2011, 06/21/2011, 04/15/2012   Varicella 01/17/2009, 02/25/2009, 02/14/2011, 06/21/2011    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   ondansetron (ZOFRAN) 4 MG tablet Take 1 tablet (4 mg total) by mouth every 8 (eight) hours as needed for nausea or vomiting.   acetaminophen (TYLENOL) 325 MG tablet Take 650 mg by mouth every 6 (six) hours as needed for mild pain.   folic acid (FOLVITE) 1 MG tablet Take 1 tablet (1 mg total) by mouth daily.   hydroxyurea (HYDREA) 100 mg/mL SUSP Take 1,200 mg by mouth daily.   ibuprofen (ADVIL) 200 MG tablet Take 400 mg by mouth every 6 (six) hours as needed for mild pain.   oxyCODONE-acetaminophen (PERCOCET) 5-325 MG tablet Take 1 tablet by mouth every 4 (four) hours as needed for severe pain. (Patient not taking: Reported on 11/15/2021)   propranolol (INDERAL) 20 MG tablet Take 1 tablet (20 mg total) by mouth daily. (Patient not taking: Reported on 11/15/2021)   Vitamin D, Ergocalciferol, (DRISDOL) 1.25 MG (50000 UNIT) CAPS capsule Take 1 capsule (50,000 Units total) by mouth every 7 (seven) days.   voxelotor (OXBRYTA) 500  MG TABS tablet Take 1,500 mg by mouth daily.   No facility-administered encounter medications on file as of 12/21/2021.     Review of Systems  Review of  Systems  Constitutional: Negative.   HENT: Negative.    Cardiovascular: Negative.   Gastrointestinal:  Positive for abdominal pain and nausea. Negative for abdominal distention, constipation and diarrhea.  Allergic/Immunologic: Negative.   Neurological: Negative.   Psychiatric/Behavioral: Negative.         Physical Exam  BP 107/70   Pulse 72   Temp 98 F (36.7 C)   Ht _0  (1.549 m)   Wt 107 lb (48.5 kg)   SpO2 98%   BMI 20.22 kg/m   Wt Readings from Last 5 Encounters:  12/21/21 107 lb (48.5 kg)  11/15/21 110 lb (49.9 kg) (15 %, Z= -1.04)*  11/08/21 111 lb 9.6 oz (50.6 kg) (18 %, Z= -0.93)*  05/03/21 111 lb (50.3 kg) (17 %, Z= -0.94)*  04/27/21 111 lb (50.3 kg) (18 %, Z= -0.93)*   * Growth percentiles are based on CDC (Girls, 2-20 Years) data.     Physical Exam Vitals and nursing note reviewed.  Constitutional:      General: She is not in acute distress.    Appearance: She is well-developed.  Cardiovascular:     Rate and Rhythm: Normal rate and regular rhythm.  Pulmonary:     Effort: Pulmonary effort is normal.     Breath sounds: Normal breath sounds.  Abdominal:     Tenderness: There is abdominal tenderness in the right lower quadrant.  Neurological:     Mental Status: She is alert and oriented to person, place, and time.      Lab Results:  CBC    Component Value Date/Time   WBC 7.5 11/16/2021 0828   RBC 1.98 (L) 11/16/2021 0828   HGB 6.6 (LL) 11/16/2021 0828   HGB 6.9 (LL) 11/08/2021 1024   HCT 18.0 (L) 11/16/2021 0828   HCT 19.6 (L) 11/08/2021 1024   PLT 352 11/16/2021 0828   PLT 393 11/08/2021 1024   MCV 90.9 11/16/2021 0828   MCV 96 11/08/2021 1024   MCH 33.3 11/16/2021 0828   MCHC 36.7 (H) 11/16/2021 0828   RDW 25.8 (H) 11/16/2021 0828   RDW 22.1 (H) 11/08/2021 1024   LYMPHSABS 3.9 11/15/2021 1239   LYMPHSABS 3.2 (H) 11/08/2021 1024   MONOABS 1.0 11/15/2021 1239   EOSABS 0.1 11/15/2021 1239   EOSABS 0.1 11/08/2021 1024   BASOSABS 0.1  11/15/2021 1239   BASOSABS 0.1 11/08/2021 1024    BMET    Component Value Date/Time   NA 139 11/15/2021 1239   NA 137 11/08/2021 1024   K 3.7 11/15/2021 1239   CL 113 (H) 11/15/2021 1239   CO2 21 (L) 11/15/2021 1239   GLUCOSE 93 11/15/2021 1239   BUN 8 11/15/2021 1239   BUN 9 11/08/2021 1024   CREATININE 0.38 (L) 11/15/2021 1239   CREATININE 0.30 (L) 05/02/2015 1442   CALCIUM 8.9 11/15/2021 1239   GFRNONAA >60 11/15/2021 1239   GFRAA NOT CALCULATED 02/19/2018 0932     Assessment & Plan:   Abdominal pain - POCT urinalysis dipstick - omeprazole (PRILOSEC) 20 MG capsule; Take 1 capsule (20 mg total) by mouth daily.  Dispense: 30 capsule; Refill: 3 - ondansetron (ZOFRAN) 4 MG tablet; Take 1 tablet (4 mg total) by mouth every 8 (eight) hours as needed for nausea or vomiting.  Dispense: 20 tablet; Refill: 0 - CBC -  Comprehensive metabolic panel - US Abdomen Complete  Follow up:  Follow up as needed     Fenton Foy, NP 12/21/2021

## 2021-12-21 NOTE — Patient Instructions (Signed)
1. Right lower quadrant abdominal pain  - POCT urinalysis dipstick - omeprazole (PRILOSEC) 20 MG capsule; Take 1 capsule (20 mg total) by mouth daily.  Dispense: 30 capsule; Refill: 3 - ondansetron (ZOFRAN) 4 MG tablet; Take 1 tablet (4 mg total) by mouth every 8 (eight) hours as needed for nausea or vomiting.  Dispense: 20 tablet; Refill: 0 - CBC - Comprehensive metabolic panel - US Abdomen Complete  Follow up:  Follow up as needed

## 2021-12-22 LAB — COMPREHENSIVE METABOLIC PANEL

## 2021-12-22 LAB — CBC
Hematocrit: 22.3 % — ABNORMAL LOW (ref 34.0–46.6)
Hemoglobin: 7.7 g/dL — ABNORMAL LOW (ref 11.1–15.9)
MCH: 30.1 pg (ref 26.6–33.0)
MCHC: 34.5 g/dL (ref 31.5–35.7)
MCV: 87 fL (ref 79–97)
NRBC: 1 % — ABNORMAL HIGH (ref 0–0)
Platelets: 436 10*3/uL (ref 150–450)
RBC: 2.56 x10E6/uL — CL (ref 3.77–5.28)
RDW: 21.9 % — ABNORMAL HIGH (ref 11.7–15.4)
WBC: 10.8 10*3/uL (ref 3.4–10.8)

## 2022-01-23 ENCOUNTER — Encounter: Payer: Self-pay | Admitting: Nurse Practitioner

## 2022-01-23 ENCOUNTER — Ambulatory Visit (INDEPENDENT_AMBULATORY_CARE_PROVIDER_SITE_OTHER): Payer: Medicaid Other | Admitting: Nurse Practitioner

## 2022-01-23 VITALS — BP 116/74 | HR 72 | Temp 98.1°F | Ht 61.0 in | Wt 108.0 lb

## 2022-01-23 DIAGNOSIS — D571 Sickle-cell disease without crisis: Secondary | ICD-10-CM

## 2022-01-23 DIAGNOSIS — K219 Gastro-esophageal reflux disease without esophagitis: Secondary | ICD-10-CM

## 2022-01-23 NOTE — Patient Instructions (Signed)

## 2022-01-23 NOTE — Assessment & Plan Note (Signed)
Well controlled on omeprazole '20mg'$  daily Continue current med Avoid spicy foods, fatty fried foods, caffeine drinks, carbonated drinks

## 2022-01-23 NOTE — Progress Notes (Signed)
Established Patient Office Visit  Subjective:  Patient ID: Alicia Villegas, female    DOB: [birth year]  Age: 21 y.o. MRN: [id number]  CC:  Chief Complaint  Patient presents with   Follow-up    Follow-up-abdominal feeling much better  and bilateral  yellow eye.    HPI Alicia Villegas is a 21 y.o. female with past medical history of  sickle cell disease, GERD,thrombocytosis, depression presents for f/u for abdominal pain. States that her abdominal pain feels much better on  omeprazole 37m daily, she has also been avoiding certain foods that upsets her stomach.   She Has not needed. Percocet in a while.    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   Sickle cell anemia ([name])    Sickle cell anemia ([name])    type SS    Past Surgical History:  Procedure Laterality Date   CHOLECYSTECTOMY     DG GALL BLADDER     HERNIA REPAIR     SPLENECTOMY     SPLENECTOMY, TOTAL      Family History  Family history unknown: Yes    Social History   Socioeconomic History   Marital status: Single    Spouse name: Not on file   Number of children: Not on file   Years of education: Not on file   Highest education level: Not on file  Occupational History   Not on file  Tobacco Use   Smoking status: Never   Smokeless tobacco: Never  Vaping Use   Vaping Use: Never used  Substance and Sexual Activity   Alcohol use: No   Drug use: No   Sexual activity: Never    Birth control/protection: Abstinence  Other Topics Concern   Not on file  Social History Narrative   ** Merged History Encounter **       ** Data from: 01/02/16 [facility]  [place] 21[place]  Lives with parents & siblings age 21 150 [phone].  No smokers in the house; No pets in the house.       ** Data from:  03/16/16 Enc Dept: [facility]   [place] home.   Social Determinants of Health   Financial Resource Strain: Not on file  Food Insecurity: Food Insecurity Present (11/15/2021)   Hunger Vital Sign    Worried About Running Out of Food in the Last Year: Often true    Ran Out of Food in the Last Year: Often true  Transportation Needs: No Transportation Needs (11/15/2021)   PRAPARE - THydrologist(Medical): No    Lack of Transportation (Non-Medical): No  Physical Activity: Not on file  Stress: Not on file  Social Connections: Not on file  Intimate Partner Violence: Not At Risk (11/15/2021)   Humiliation, Afraid, Rape, and Kick questionnaire    Fear of Current or Ex-Partner: No    Emotionally Abused: No    Physically Abused: No    Sexually Abused: No    Outpatient Medications Prior to Visit  Medication Sig Dispense Refill   acetaminophen (TYLENOL) 325 MG tablet Take 650 mg by mouth every 6 (six) hours as needed for mild pain.     folic acid (FOLVITE) 1 MG tablet Take 1  tablet (1 mg total) by mouth daily. 30 tablet 1   hydroxyurea (HYDREA) 100 mg/mL SUSP Take 1,200 mg by mouth daily.     ibuprofen (ADVIL) 200 MG tablet Take 400 mg by mouth every 6 (six) hours as needed for mild pain.     omeprazole (PRILOSEC) 20 MG capsule Take 1 capsule (20 mg total) by mouth daily. 30 capsule 3   ondansetron (ZOFRAN) 4 MG tablet Take 1 tablet (4 mg total) by mouth every 8 (eight) hours as needed for nausea or vomiting. 20 tablet 0   propranolol (INDERAL) 20 MG tablet Take 1 tablet (20 mg total) by mouth daily. 30 tablet 0   voxelotor (OXBRYTA) 500 MG TABS tablet Take 1,500 mg by mouth daily.     oxyCODONE-acetaminophen (PERCOCET) 5-325 MG tablet Take 1 tablet by mouth every 4 (four) hours as needed for severe pain. (Patient not taking: Reported on 11/15/2021) 30 tablet 0   Vitamin D, Ergocalciferol, (DRISDOL) 1.25 MG (50000 UNIT) CAPS capsule Take 1  capsule (50,000 Units total) by mouth every 7 (seven) days. (Patient not taking: Reported on 01/23/2022) 5 capsule 0   No facility-administered medications prior to visit.    No Known Allergies  ROS Review of Systems  Constitutional:  Negative for activity change, appetite change, chills, fatigue and fever.  Respiratory: Negative.  Negative for apnea, chest tightness, shortness of breath, wheezing and stridor.   Cardiovascular: Negative.  Negative for chest pain, palpitations and leg swelling.  Gastrointestinal:  Negative for abdominal distention, abdominal pain, anal bleeding and blood in stool.  Musculoskeletal: Negative.  Negative for arthralgias, back pain and gait problem.  Neurological: Negative.  Negative for dizziness, facial asymmetry, light-headedness and headaches.  Psychiatric/Behavioral: Negative.  Negative for agitation, behavioral problems, confusion, decreased concentration and dysphoric mood.       Objective:    Physical Exam Eyes:     General: Scleral icterus present.     Extraocular Movements: Extraocular movements intact.  Cardiovascular:     Rate and Rhythm: Normal rate.     Pulses: Normal pulses.     Heart sounds: Normal heart sounds. No murmur heard.    No friction rub. No gallop.  Pulmonary:     Effort: Pulmonary effort is normal. No respiratory distress.     Breath sounds: Normal breath sounds. No stridor. No wheezing, rhonchi or rales.  Chest:     Chest wall: No tenderness.  Abdominal:     General: There is no distension.     Palpations: Abdomen is soft.     Tenderness: There is no abdominal tenderness. There is no guarding.  Musculoskeletal:        General: No swelling, tenderness, deformity or signs of injury.     Right lower leg: No edema.     Left lower leg: No edema.  Skin:    General: Skin is warm and dry.     Capillary Refill: Capillary refill takes less than 2 seconds.     Coloration: Skin is not jaundiced or pale.     Findings: No  bruising, erythema, lesion or rash.  Neurological:     Mental Status: She is oriented to person, place, and time.     Sensory: No sensory deficit.     Motor: No weakness.     Coordination: Coordination normal.     Gait: Gait normal.  Psychiatric:        Mood and Affect: Mood normal.        Behavior: Behavior  normal.        Thought Content: Thought content normal.        Judgment: Judgment normal.     BP 116/74   Pulse 72   Temp 98.1 F (36.7 C)   Ht _0  (1.549 m)   Wt 108 lb (49 kg)   SpO2 95%   BMI 20.41 kg/m  Wt Readings from Last 3 Encounters:  01/23/22 108 lb (49 kg)  12/21/21 107 lb (48.5 kg)  11/15/21 110 lb (49.9 kg) (15 %, Z= -1.04)*   * Growth percentiles are based on CDC (Girls, 2-20 Years) data.    No results found for: "TSH" Lab Results  Component Value Date   WBC 10.8 12/21/2021   HGB 7.7 (L) 12/21/2021   HCT 22.3 (L) 12/21/2021   MCV 87 12/21/2021   PLT 436 12/21/2021   Lab Results  Component Value Date   NA CANCELED 12/21/2021   K CANCELED 12/21/2021   CO2 CANCELED 12/21/2021   GLUCOSE CANCELED 12/21/2021   BUN CANCELED 12/21/2021   CREATININE CANCELED 12/21/2021   BILITOT CANCELED 12/21/2021   ALKPHOS CANCELED 12/21/2021   AST CANCELED 12/21/2021   ALT CANCELED 12/21/2021   PROT CANCELED 12/21/2021   ALBUMIN CANCELED 12/21/2021   CALCIUM CANCELED 12/21/2021   ANIONGAP 5 11/15/2021   EGFR 146 11/08/2021   No results found for: "CHOL" No results found for: "HDL" No results found for: "LDLCALC" No results found for: "TRIG" No results found for: "CHOLHDL" No results found for: "HGBA1C"    Assessment & Plan:   Problem List Items Addressed This Visit       Digestive   Gastroesophageal reflux disease - Primary    Well controlled on omeprazole 20m daily Continue current med Avoid spicy foods, fatty fried foods, caffeine drinks, carbonated drinks        Other   Hb-SS disease without crisis ([name])    Sickle cell disease -  Continue Hydrea 1200 mg daily, Floic acid 139mdaily, oxbryta 150082maily.,  Will check CBC for absolute neutrophil count and platelets. . We discussed the need for good hydration, monitoring of hydration status, avoidance of heat, cold, stress, and infection triggers. We discussed the  benefits of Hydrea, . The patient was reminded of the need to seek medical attention of any symptoms of bleeding, anemia, or infection.   Pulmonary evaluation - Patient denies severe recurrent wheezes, shortness of breath with exercise, or persistent cough.   Immunization status - Patient declined influenza vaccination on today. She encouraged to consider getting the vaccine. Did not receive COVID vaccine  Acute and chronic painful episodes - continue percocet 5-325m46m tablet every 4 hours as needed for severe pain .ibuprofen 400mg28mry 8 hours PRN for mild to moderate  pain  Follow up in 3 months  Lab Results  Component Value Date   WBC 10.8 12/21/2021   HGB 7.7 (L) 12/21/2021   HCT 22.3 (L) 12/21/2021   MCV 87 12/21/2021   PLT 436 12/21/2021         Relevant Orders   CBC with Differential    No orders of the defined types were placed in this encounter.   Follow-up: Return in about 3 months (around 04/24/2022).    FolasRenee Rival

## 2022-01-23 NOTE — Assessment & Plan Note (Addendum)
Sickle cell disease - Continue Hydrea 1200 mg daily, Floic acid '1mg'$  daily, oxbryta '1500mg'$  daily.,  Will check CBC for absolute neutrophil count and platelets. . We discussed the need for good hydration, monitoring of hydration status, avoidance of heat, cold, stress, and infection triggers. We discussed the  benefits of Hydrea, . The patient was reminded of the need to seek medical attention of any symptoms of bleeding, anemia, or infection.   Pulmonary evaluation - Patient denies severe recurrent wheezes, shortness of breath with exercise, or persistent cough.   Immunization status - Patient declined influenza vaccination on today. She encouraged to consider getting the vaccine. Did not receive COVID vaccine  Acute and chronic painful episodes - continue percocet 5-'325mg'$ , 1 tablet every 4 hours as needed for severe pain .ibuprofen '400mg'$  every 8 hours PRN for mild to moderate  pain  Follow up in 3 months  Lab Results  Component Value Date   WBC 10.8 12/21/2021   HGB 7.7 (L) 12/21/2021   HCT 22.3 (L) 12/21/2021   MCV 87 12/21/2021   PLT 436 12/21/2021

## 2022-02-07 DIAGNOSIS — K5900 Constipation, unspecified: Secondary | ICD-10-CM | POA: Diagnosis not present

## 2022-02-07 DIAGNOSIS — K5909 Other constipation: Secondary | ICD-10-CM | POA: Diagnosis not present

## 2022-02-07 DIAGNOSIS — I272 Pulmonary hypertension, unspecified: Secondary | ICD-10-CM | POA: Diagnosis not present

## 2022-02-07 DIAGNOSIS — G43909 Migraine, unspecified, not intractable, without status migrainosus: Secondary | ICD-10-CM | POA: Diagnosis not present

## 2022-02-07 DIAGNOSIS — D571 Sickle-cell disease without crisis: Secondary | ICD-10-CM | POA: Diagnosis not present

## 2022-02-07 DIAGNOSIS — E559 Vitamin D deficiency, unspecified: Secondary | ICD-10-CM | POA: Diagnosis not present

## 2022-02-07 DIAGNOSIS — M545 Low back pain, unspecified: Secondary | ICD-10-CM | POA: Diagnosis not present

## 2022-03-19 ENCOUNTER — Other Ambulatory Visit: Payer: Self-pay

## 2022-03-19 ENCOUNTER — Observation Stay (HOSPITAL_COMMUNITY)
Admission: AD | Admit: 2022-03-19 | Discharge: 2022-03-20 | Disposition: A | Discharge status: Home / Self Care | Payer: Medicaid Other | Source: Ambulatory Visit | Attending: Internal Medicine | Admitting: Internal Medicine

## 2022-03-19 ENCOUNTER — Telehealth (HOSPITAL_COMMUNITY): Payer: Self-pay | Admitting: *Deleted

## 2022-03-19 DIAGNOSIS — D5700 Hb-SS disease with crisis, unspecified: Secondary | ICD-10-CM

## 2022-03-19 DIAGNOSIS — D649 Anemia, unspecified: Principal | ICD-10-CM | POA: Diagnosis present

## 2022-03-19 DIAGNOSIS — D638 Anemia in other chronic diseases classified elsewhere: Secondary | ICD-10-CM | POA: Diagnosis present

## 2022-03-19 DIAGNOSIS — D75839 Thrombocytosis, unspecified: Secondary | ICD-10-CM | POA: Diagnosis present

## 2022-03-19 DIAGNOSIS — D571 Sickle-cell disease without crisis: Secondary | ICD-10-CM | POA: Diagnosis present

## 2022-03-19 LAB — CBC WITH DIFFERENTIAL/PLATELET
Abs Immature Granulocytes: 0.04 10*3/uL (ref 0.00–0.07)
Basophils Absolute: 0.1 10*3/uL (ref 0.0–0.1)
Basophils Relative: 1 %
Eosinophils Absolute: 0.1 10*3/uL (ref 0.0–0.5)
Eosinophils Relative: 1 %
HCT: 17.5 % — ABNORMAL LOW (ref 36.0–46.0)
Hemoglobin: 6.4 g/dL — CL (ref 12.0–15.0)
Immature Granulocytes: 1 %
Lymphocytes Relative: 46 %
Lymphs Abs: 3.7 10*3/uL (ref 0.7–4.0)
MCH: 32.8 pg (ref 26.0–34.0)
MCHC: 36.6 g/dL — ABNORMAL HIGH (ref 30.0–36.0)
MCV: 89.7 fL (ref 80.0–100.0)
Monocytes Absolute: 0.8 10*3/uL (ref 0.1–1.0)
Monocytes Relative: 10 %
Neutro Abs: 3.3 10*3/uL (ref 1.7–7.7)
Neutrophils Relative %: 41 %
Platelets: 415 10*3/uL — ABNORMAL HIGH (ref 150–400)
RBC: 1.95 MIL/uL — ABNORMAL LOW (ref 3.87–5.11)
RDW: 23.7 % — ABNORMAL HIGH (ref 11.5–15.5)
WBC: 8 10*3/uL (ref 4.0–10.5)
nRBC: 0.3 % — ABNORMAL HIGH (ref 0.0–0.2)

## 2022-03-19 LAB — COMPREHENSIVE METABOLIC PANEL
ALT: 11 U/L (ref 0–44)
AST: 41 U/L (ref 15–41)
Albumin: 4.6 g/dL (ref 3.5–5.0)
Alkaline Phosphatase: 62 U/L (ref 38–126)
Anion gap: 10 (ref 5–15)
BUN: 5 mg/dL — ABNORMAL LOW (ref 6–20)
CO2: 20 mmol/L — ABNORMAL LOW (ref 22–32)
Calcium: 9 mg/dL (ref 8.9–10.3)
Chloride: 106 mmol/L (ref 98–111)
Creatinine, Ser: 0.46 mg/dL (ref 0.44–1.00)
GFR, Estimated: >60 mL/min (ref >60)
Glucose, Bld: 87 mg/dL (ref 70–99)
Potassium: 4 mmol/L (ref 3.5–5.1)
Sodium: 136 mmol/L (ref 135–145)
Total Bilirubin: 2.1 mg/dL — ABNORMAL HIGH (ref 0.3–1.2)
Total Protein: 7.6 g/dL (ref 6.5–8.1)

## 2022-03-19 LAB — LACTATE DEHYDROGENASE: LDH: 588 U/L — ABNORMAL HIGH (ref 98–192)

## 2022-03-19 LAB — RETICULOCYTES
Immature Retic Fract: 45.2 % — ABNORMAL HIGH (ref 2.3–15.9)
RBC.: 1.96 MIL/uL — ABNORMAL LOW (ref 3.87–5.11)
Retic Count, Absolute: 200.5 10*3/uL — ABNORMAL HIGH (ref 19.0–186.0)
Retic Ct Pct: 10.9 % — ABNORMAL HIGH (ref 0.4–3.1)

## 2022-03-19 LAB — PREPARE RBC (CROSSMATCH)

## 2022-03-19 MED ORDER — SENNOSIDES-DOCUSATE SODIUM 8.6-50 MG PO TABS
1.0000 | ORAL_TABLET | Freq: Two times a day (BID) | ORAL | Status: DC
  Administered 2022-03-20: 1 via ORAL
  Filled 2022-03-19: qty 1

## 2022-03-19 MED ORDER — KETOROLAC TROMETHAMINE 15 MG/ML IJ SOLN
15.0000 mg | Freq: Four times a day (QID) | INTRAMUSCULAR | Status: DC
  Administered 2022-03-19 – 2022-03-20 (×2): 15 mg via INTRAVENOUS
  Filled 2022-03-19 (×2): qty 1

## 2022-03-19 MED ORDER — ACETAMINOPHEN 500 MG PO TABS
1000.0000 mg | ORAL_TABLET | Freq: Once | ORAL | Status: AC
  Administered 2022-03-19: 1000 mg via ORAL
  Filled 2022-03-19: qty 2

## 2022-03-19 MED ORDER — ENOXAPARIN SODIUM 40 MG/0.4ML IJ SOSY: 40.0000 mg | PREFILLED_SYRINGE | Freq: Every day | INTRAMUSCULAR | Status: DC

## 2022-03-19 MED ORDER — VOXELOTOR 500 MG PO TABS: 1500.0000 mg | ORAL_TABLET | Freq: Every day | ORAL | Status: DC

## 2022-03-19 MED ORDER — ACETAMINOPHEN 325 MG PO TABS
650.0000 mg | ORAL_TABLET | Freq: Once | ORAL | Status: AC
  Administered 2022-03-20: 650 mg via ORAL
  Filled 2022-03-19 (×2): qty 2

## 2022-03-19 MED ORDER — KETOROLAC TROMETHAMINE 30 MG/ML IJ SOLN
15.0000 mg | Freq: Once | INTRAMUSCULAR | Status: AC
  Administered 2022-03-19: 15 mg via INTRAVENOUS
  Filled 2022-03-19: qty 1

## 2022-03-19 MED ORDER — FOLIC ACID 1 MG PO TABS
1.0000 mg | ORAL_TABLET | Freq: Every day | ORAL | Status: DC
  Administered 2022-03-19 – 2022-03-20 (×2): 1 mg via ORAL
  Filled 2022-03-19 (×2): qty 1

## 2022-03-19 MED ORDER — SODIUM CHLORIDE 0.9% IV SOLUTION: Freq: Once | INTRAVENOUS | Status: DC

## 2022-03-19 MED ORDER — POLYETHYLENE GLYCOL 3350 17 G PO PACK: 17.0000 g | PACK | Freq: Every day | ORAL | Status: DC | PRN

## 2022-03-19 MED ORDER — PROPRANOLOL HCL 20 MG PO TABS: 20.0000 mg | ORAL_TABLET | Freq: Every day | ORAL | Status: DC

## 2022-03-19 MED ORDER — SODIUM CHLORIDE 0.45 % IV SOLN: INTRAVENOUS | Status: DC

## 2022-03-19 MED ORDER — DIPHENHYDRAMINE HCL 50 MG/ML IJ SOLN
25.0000 mg | Freq: Once | INTRAMUSCULAR | Status: AC
  Administered 2022-03-20: 25 mg via INTRAVENOUS
  Filled 2022-03-19: qty 1

## 2022-03-19 MED ORDER — OXYCODONE-ACETAMINOPHEN 5-325 MG PO TABS: 1.0000 | ORAL_TABLET | ORAL | Status: DC | PRN

## 2022-03-19 NOTE — H&P (Signed)
H&P  Patient Demographics:  Alicia Villegas, is a 22 y.o. female  MRN: [id number]   DOB - [birth year]  Admit Date - 03/19/2022  Outpatient Primary MD for the patient is Dorena Dew, FNP       HPI:   Alicia Villegas  is a 22 y.o. female is a 22 year old female with a medical history significant for sickle cell disease that presents to sickle cell day infusion clinic with complaints of fatigue and headache over the past week.  Patient states that her menstrual cycle started 1 week ago and was very painful.  From that point on, patient reports fatigue and increased headache.  Headache has been unrelieved by ibuprofen and Tylenol.  Patient states that she typically feels "weak" when her hemoglobin is low.  Patient's baseline hemoglobin has 7-8.  She has been followed by [facility] hematology in the past.  Patient has been consistently taking folic acid and hydroxyurea.  She denies fever, chills, chest pain, or shortness of breath.  No urinary symptoms, nausea, vomiting, or diarrhea.  No sick contacts, recent travel, or known exposure to COVID-19.  Day infusion center course: BP (!) 84/49 (BP Location: Right Arm)   Pulse 87   Temp 98.3 F (36.8 C) (Oral)   Resp 17   LMP 03/12/2022   SpO2 93%  Blood pressure decreased to 84/49, patient's blood pressure typically runs low.  Patient's hemoglobin is 6.4 g/dL, which is below her baseline.  Platelets 415,000.  Comprehensive metabolic panel shows total bilirubin elevated at 2.1, otherwise unremarkable.  Patient will be admitted in observation for symptomatic anemia.   Review of systems:  Review of Systems  Constitutional:  Positive for malaise/fatigue. Negative for chills and fever.  HENT: Negative.    Eyes: Negative.   Respiratory: Negative.  Negative for shortness of breath.   Cardiovascular: Negative.  Negative for chest pain.  Gastrointestinal: Negative.   Genitourinary: Negative.   Musculoskeletal: Negative.   Skin: Negative.    Neurological:  Positive for headaches.  Endo/Heme/Allergies: Negative.     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 Authorizing Provider  acetaminophen (TYLENOL) 325 MG tablet Take 650 mg by mouth every 6 (six) hours as needed for mild pain.   Yes [provider]  hydroxyurea (HYDREA) 100 mg/mL SUSP Take 1,200 mg by mouth daily.   Yes [provider]  ibuprofen (ADVIL) 200 MG tablet Take 400 mg by mouth every 6 (six) hours as needed for mild pain.   Yes [provider]  voxelotor (OXBRYTA) 500 MG TABS tablet Take 1,500 mg by mouth daily. 10/19/20  Yes [provider]  folic acid (FOLVITE) 1 MG tablet Take 1 tablet (1 mg total) by mouth daily. 05/11/21 05/11/22  Dorena Dew, FNP  omeprazole (PRILOSEC) 20 MG capsule Take 1 capsule (20 mg total) by mouth daily. 12/21/21   Fenton Foy, NP  ondansetron (ZOFRAN) 4 MG tablet Take 1 tablet (4 mg total) by mouth every 8 (eight) hours as needed for nausea or vomiting. 12/21/21   Fenton Foy, NP  oxyCODONE-acetaminophen (PERCOCET) 5-325 MG tablet Take 1 tablet by mouth every 4 (four) hours as needed for severe pain. Patient not taking: Reported  on 11/15/2021 05/11/21 05/11/22  Dorena Dew, FNP  propranolol (INDERAL) 20 MG tablet Take 1 tablet (20 mg total) by mouth daily. 04/28/21   Dorena Dew, FNP  Vitamin D, Ergocalciferol, (DRISDOL) 1.25 MG (50000 UNIT) CAPS capsule Take 1 capsule (50,000 Units total) by mouth every 7 (seven) days. Patient not taking: Reported on 01/23/2022 11/09/21   Fenton Foy, NP     Allergies:   No Known Allergies   Physical Exam:   Vitals:   Vitals:   03/19/22 1448 03/19/22 1554  BP: (!) 106/58 (!) 84/49  Pulse: 80 87  Resp: 16 17  Temp: 98 F (36.7 C) 98.3 F (36.8 C)  SpO2: 96% 93%    Physical Exam: Constitutional: Patient appears well-developed and well-nourished. Not in obvious distress. HENT: Normocephalic, atraumatic, External right and left ear normal. Oropharynx is clear and moist.  Eyes: Conjunctivae and EOM are normal. PERRLA, no scleral icterus. Neck: Normal ROM. Neck supple. No JVD. No tracheal deviation. No thyromegaly. CVS: RRR, S1/S2 +, no murmurs, no gallops, no carotid bruit.  Pulmonary: Effort and breath sounds normal, no stridor, rhonchi, wheezes, rales.  Abdominal: Soft. BS +, no distension, tenderness, rebound or guarding.  Musculoskeletal: Normal range of motion. No edema and no tenderness.  Lymphadenopathy: No lymphadenopathy noted, cervical, inguinal or axillary Neuro: Alert. Normal reflexes, muscle tone coordination. No cranial nerve deficit. Skin: Skin is warm and dry. No rash noted. Not diaphoretic. No erythema. No pallor. Psychiatric: Normal mood and affect. Behavior, judgment, thought content normal.   Data Review:   CBC Recent Labs  Lab 03/19/22 1229  WBC 8.0  HGB 6.4*  HCT 17.5*  PLT 415*  MCV 89.7  MCH 32.8  MCHC 36.6*  RDW 23.7*  LYMPHSABS 3.7  MONOABS 0.8  EOSABS 0.1  BASOSABS 0.1   ------------------------------------------------------------------------------------------------------------------  Chemistries  Recent Labs   Lab 03/19/22 1229  NA 136  K 4.0  CL 106  CO2 20*  GLUCOSE 87  BUN 5*  CREATININE 0.46  CALCIUM 9.0  AST 41  ALT 11  ALKPHOS 62  BILITOT 2.1*   ------------------------------------------------------------------------------------------------------------------ CrCl cannot be calculated (Unknown ideal weight.). ------------------------------------------------------------------------------------------------------------------ No results for input(s): "TSH", "T4TOTAL", "T3FREE", "THYROIDAB" in the last 72 hours.  Invalid input(s): "FREET3"  Coagulation profile No results for input(s): "INR", "PROTIME" in the last 168 hours. ------------------------------------------------------------------------------------------------------------------- No results for input(s): "DDIMER" in the last 72 hours. -------------------------------------------------------------------------------------------------------------------  Cardiac Enzymes No results for input(s): "CKMB", "TROPONINI", "MYOGLOBIN" in the last 168 hours.  Invalid input(s): "CK" ------------------------------------------------------------------------------------------------------------------ No results found for: "BNP"  ---------------------------------------------------------------------------------------------------------------  Urinalysis    Component Value Date/Time   COLORURINE AMBER (A) 03/09/2021 0000   APPEARANCEUR HAZY (A) 03/09/2021 0000   LABSPEC 1.020 03/09/2021 0000   PHURINE 6.0 03/09/2021 0000   GLUCOSEU  NEGATIVE 03/09/2021 0000   HGBUR LARGE (A) 03/09/2021 0000   BILIRUBINUR neg 12/21/2021 1126   KETONESUR negative 04/11/2021 1241   KETONESUR NEGATIVE 03/09/2021 0000   PROTEINUR Negative 12/21/2021 1126   PROTEINUR NEGATIVE 03/09/2021 0000   UROBILINOGEN 2.0 (A) 12/21/2021 1126   UROBILINOGEN 1.0 01/13/2014 1612   NITRITE neg 12/21/2021 1126   NITRITE NEGATIVE 03/09/2021 0000   LEUKOCYTESUR Negative  12/21/2021 1126   LEUKOCYTESUR SMALL (A) 03/09/2021 0000    ----------------------------------------------------------------------------------------------------------------   Imaging Results:    No results found.   Assessment & Plan:  Principal Problem:   Sickle cell crisis ([name]) Active Problems:   Symptomatic anemia  Symptomatic anemia: Patient's hemoglobin 6.4 g/dL, which is below her baseline.  She also endorses fatigue.  Will transfuse 1 unit PRBCs overnight.  Follow labs in AM.  If hemoglobin less than 7, consider transfusing additional unit of PRBCs.  Sickle cell disease with pain: No sickle cell crisis at this time.  Restart patient's home medication of Percocet 5-3 25 every 6 hours as needed for severe pain Toradol 15 mg IV every 6 hours Monitor vital signs very closely, reevaluate pain scale regularly, and supplemental oxygen as needed  Thrombocytosis: Appears to be chronic and secondary to sickle cell disease.  Continue to monitor closely.  Labs in AM.    DVT Prophylaxis: Subcut Lovenox   AM Labs Ordered, also please review Full Orders  Family Communication: Admission, patient's condition and plan of care including tests being ordered have been discussed with the patient who indicate understanding and agree with the plan and Code Status.  Code Status: Full Code  Consults called: None    Admission status: Inpatient    Time spent in minutes : \30 minutes  Tomp[place], MSN, FNP-C [facility] [address] [place], [place] [address] [phone]  03/19/2022 at 3:56 PM

## 2022-03-19 NOTE — Progress Notes (Signed)
Pt admitted to day hospital today. On arrival, pt reports feeling week and being cold, provider notified. Pt reports 5/10 headache. Pt received PO Tylenol 1,000 mg, IV Torodal 30m, and hydrated with IV fluids via PIV. Pts Hgb resulted at 6.4, [place]. Plan is for pt to be admitted to [facility] receive [facility]. Type and screen has been sent, and pt is wearing blue blood bank bracelet. Report given to [place]  At time of transfer, pt reports headache pain being 1/10. PIV flushed and saline locked at this time. Pt transferred in wheelchair to [address] 12 by RN.

## 2022-03-19 NOTE — Telephone Encounter (Signed)
Patient's sponsor, [name], called reporting that patient had contacted her and wanted to come to the day hospital. Patient does not currently have access to a phone so she can't call the day hospital for triage. Per [name], patient reports that she may need her hemoglobin checked. [name] is unsure if patient is having pain crisis. [place], [place]on notified and advised that patient come to the day hospital for lab draw and possible pain management if necessary. Advised [name] that patient can come to the day hospital. [name] will notify patient and provider her with transportation to the day hospital.

## 2022-03-20 DIAGNOSIS — D649 Anemia, unspecified: Secondary | ICD-10-CM | POA: Diagnosis not present

## 2022-03-20 LAB — HIV ANTIBODY (ROUTINE TESTING W REFLEX): HIV Screen 4th Generation wRfx: NONREACTIVE

## 2022-03-20 LAB — BASIC METABOLIC PANEL
Anion gap: 7 (ref 5–15)
BUN: 8 mg/dL (ref 6–20)
CO2: 21 mmol/L — ABNORMAL LOW (ref 22–32)
Calcium: 8.8 mg/dL — ABNORMAL LOW (ref 8.9–10.3)
Chloride: 113 mmol/L — ABNORMAL HIGH (ref 98–111)
Creatinine, Ser: 0.53 mg/dL (ref 0.44–1.00)
GFR, Estimated: >60 mL/min (ref >60)
Glucose, Bld: 102 mg/dL — ABNORMAL HIGH (ref 70–99)
Potassium: 3.7 mmol/L (ref 3.5–5.1)
Sodium: 141 mmol/L (ref 135–145)

## 2022-03-20 LAB — CBC
HCT: 17 % — ABNORMAL LOW (ref 36.0–46.0)
HCT: 32.1 % — ABNORMAL LOW (ref 36.0–46.0)
Hemoglobin: 6.2 g/dL — CL (ref 12.0–15.0)
Hemoglobin: 11.2 g/dL — ABNORMAL LOW (ref 12.0–15.0)
MCH: 33.5 pg (ref 26.0–34.0)
MCH: 31.7 pg (ref 26.0–34.0)
MCHC: 36.5 g/dL — ABNORMAL HIGH (ref 30.0–36.0)
MCHC: 34.9 g/dL (ref 30.0–36.0)
MCV: 91.9 fL (ref 80.0–100.0)
MCV: 90.9 fL (ref 80.0–100.0)
Platelets: 354 10*3/uL (ref 150–400)
Platelets: 454 10*3/uL — ABNORMAL HIGH (ref 150–400)
RBC: 1.85 MIL/uL — ABNORMAL LOW (ref 3.87–5.11)
RBC: 3.53 MIL/uL — ABNORMAL LOW (ref 3.87–5.11)
RDW: 24.1 % — ABNORMAL HIGH (ref 11.5–15.5)
RDW: 19.9 % — ABNORMAL HIGH (ref 11.5–15.5)
WBC: 8.6 10*3/uL (ref 4.0–10.5)
WBC: 8.4 10*3/uL (ref 4.0–10.5)
nRBC: 1.5 % — ABNORMAL HIGH (ref 0.0–0.2)
nRBC: 1.4 % — ABNORMAL HIGH (ref 0.0–0.2)

## 2022-03-20 NOTE — Progress Notes (Signed)
Called blood bank for update, specialty RBCs just arrived to facility, blood bank will call me once it is ready. Patient stable at this time.

## 2022-03-20 NOTE — Discharge Summary (Signed)
Physician Discharge Summary  Alicia Villegas [id number] DOB: 03/28/2000 DOA: 03/19/2022  PCP: Dorena Dew, FNP  Admit date: 03/19/2022  Discharge date: 03/20/2022  Discharge Diagnoses:  Principal Problem:   Symptomatic anemia Active Problems:   Sickle cell disease, type SS ([name])   Anemia of chronic disease   Thrombocytosis   Discharge Condition: Stable  Disposition:   Follow-up Information     Dorena Dew, FNP Follow up in 1 week(s).   Specialty: Family Medicine Why: labs in sickle cell day clinic Contact information: [address] [address] [phone]                Pt is discharged home in good condition and is to follow up with Dorena Dew, FNP this week to have labs evaluated. [name] is instructed to increase activity slowly and balance with rest for the next few days, and use prescribed medication to complete treatment of pain  Diet: Regular Wt Readings from Last 3 Encounters:  03/19/22 44.7 kg  01/23/22 49 kg  12/21/21 48.5 kg    History of present illness:  Alicia Villegas  is a 22 y.o. female is a 22 year old female with a medical history significant for sickle cell disease that presents to sickle cell day infusion clinic with complaints of fatigue and headache over the past week.  Patient states that her menstrual cycle started 1 week ago and was very painful.  From that point on, patient reports fatigue and increased headache.  Headache has been unrelieved by ibuprofen and Tylenol.  Patient states that she typically feels "weak" when her hemoglobin is low.  Patient's baseline hemoglobin has 7-8.  She has been followed by [facility] hematology in the past.  Patient has been consistently taking folic acid and hydroxyurea.  She denies fever, chills, chest pain, or shortness of breath.  No urinary symptoms, nausea, vomiting, or diarrhea.  No sick contacts, recent travel, or known exposure to COVID-19.   Day  infusion center course: BP (!) 84/49 (BP Location: Right Arm)   Pulse 87   Temp 98.3 F (36.8 C) (Oral)   Resp 17   LMP 03/12/2022   SpO2 93%  Blood pressure decreased to 84/49, patient's blood pressure typically runs low.  Patient's hemoglobin is 6.4 g/dL, which is below her baseline.  Platelets 415,000.  Comprehensive metabolic panel shows total bilirubin elevated at 2.1, otherwise unremarkable.  Patient will be admitted in observation for symptomatic anemia.    Hospital Course:  Symptomatic anemia: Patient admitted for symptomatic anemia in the setting of sickle cell pain.  On admission, hemoglobin was 6.4 g/dL.  Patient received 2 units PRBCs.  Hemoglobin increased above baseline at 11.2.  Patient will follow-up at the patient care center in 1 week to repeat labs. Patient is not having any pain on today and is requesting discharge home. Vital signs are stable.  Patient is alert, oriented, and ambulating without assistance.  She will discharge home in a hemodynamically stable condition. Patient was therefore discharged home today in a hemodynamically stable condition.   [name] will follow-up with PCP within 1 week of this discharge. [name] was counseled extensively about nonpharmacologic means of pain management, patient verbalized understanding and was appreciative of  the care received during this admission.   We discussed the need for good hydration, monitoring of hydration status, avoidance of heat, cold, stress, and infection triggers. We discussed the need to be adherent with taking Hydrea and other home medications.  Patient was reminded of the need to seek medical attention immediately if any symptom of bleeding, anemia, or infection occurs.  Discharge Exam: Vitals:   03/20/22 0942 03/20/22 1150  BP: 104/64 100/60  Pulse: 85 77  Resp: 16 15  Temp: 98.1 F (36.7 C) 97.9 F (36.6 C)  SpO2: 93% 94%   Vitals:   03/20/22 0653 03/20/22 0915 03/20/22 0942 03/20/22 1150  BP: (!)  90/53 95/62 104/64 100/60  Pulse: 82 82 85 77  Resp: 14 16 16 15  $ Temp: 98.6 F (37 C) 98.2 F (36.8 C) 98.1 F (36.7 C) 97.9 F (36.6 C)  TempSrc: Oral Oral Oral Oral  SpO2: 90% 93% 93% 94%  Weight:        General appearance : Awake, alert, not in any distress. Speech Clear. Not toxic looking HEENT: Atraumatic and Normocephalic, pupils equally reactive to light and accomodation Neck: Supple, no JVD. No cervical lymphadenopathy.  Chest: Good air entry bilaterally, no added sounds  CVS: S1 S2 regular, no murmurs.  Abdomen: Bowel sounds present, Non tender and not distended with no gaurding, rigidity or rebound. Extremities: B/L Lower Ext shows no edema, both legs are warm to touch Neurology: Awake alert, and oriented X 3, CN II-XII intact, Non focal Skin: No Rash  Discharge Instructions  Discharge Instructions     Discharge patient   Complete by: As directed    Discharge disposition: 01-Home or Self Care   Discharge patient date: 03/20/2022      Allergies as of 03/20/2022   No Known Allergies      Medication List     TAKE these medications    acetaminophen 500 MG tablet Commonly known as: TYLENOL Take 500 mg by mouth every 6 (six) hours as needed for mild pain.   folic acid 1 MG tablet Commonly known as: FOLVITE Take 1 tablet (1 mg total) by mouth daily.   hydroxyurea 100 mg/mL Susp Commonly known as: HYDREA Take 1,200 mg by mouth at bedtime.   ibuprofen 200 MG tablet Commonly known as: ADVIL Take 200-800 mg by mouth every 6 (six) hours as needed for mild pain or headache.   omeprazole 20 MG capsule Commonly known as: PRILOSEC Take 1 capsule (20 mg total) by mouth daily.   ondansetron 4 MG tablet Commonly known as: Zofran Take 1 tablet (4 mg total) by mouth every 8 (eight) hours as needed for nausea or vomiting.   oxyCODONE-acetaminophen 5-325 MG tablet Commonly known as: Percocet Take 1 tablet by mouth every 4 (four) hours as needed for severe  pain.   prochlorperazine 10 MG tablet Commonly known as: COMPAZINE Take 10 mg by mouth every 6 (six) hours as needed for nausea or vomiting ("for headaches").   propranolol 20 MG tablet Commonly known as: INDERAL Take 1 tablet (20 mg total) by mouth daily.   sterile water for irrigation   Vitamin D (Ergocalciferol) 1.25 MG (50000 UNIT) Caps capsule Commonly known as: DRISDOL Take 1 capsule (50,000 Units total) by mouth every 7 (seven) days.   voxelotor 500 MG Tabs tablet Commonly known as: OXBRYTA Take 1,500 mg by mouth at bedtime.        The results of significant diagnostics from this hospitalization (including imaging, microbiology, ancillary and laboratory) are listed below for reference.    Significant Diagnostic Studies: No results found.  Microbiology: No results found for this or any previous visit (from the past 240 hour(s)).   Labs: Basic Metabolic Panel: Recent Labs  Lab 03/19/22  1229 03/20/22 0616  NA 136 141  K 4.0 3.7  CL 106 113*  CO2 20* 21*  GLUCOSE 87 102*  BUN 5* 8  CREATININE 0.46 0.53  CALCIUM 9.0 8.8*   Liver Function Tests: Recent Labs  Lab 03/19/22 1229  AST 41  ALT 11  ALKPHOS 62  BILITOT 2.1*  PROT 7.6  ALBUMIN 4.6   No results for input(s): "LIPASE", "AMYLASE" in the last 168 hours. No results for input(s): "AMMONIA" in the last 168 hours. CBC: Recent Labs  Lab 03/19/22 1229 03/20/22 0616 03/20/22 1519  WBC 8.0 8.6 8.4  NEUTROABS 3.3  --   --   HGB 6.4* 6.2* 11.2*  HCT 17.5* 17.0* 32.1*  MCV 89.7 91.9 90.9  PLT 415* 354 454*   Cardiac Enzymes: No results for input(s): "CKTOTAL", "CKMB", "CKMBINDEX", "TROPONINI" in the last 168 hours. BNP: Invalid input(s): "POCBNP" CBG: No results for input(s): "GLUCAP" in the last 168 hours.  Time coordinating discharge: 30 minutes  Signed: Donia Pounds  APRN, MSN, FNP-C [facility] [address] [place], [place]  [address] [phone]  [facility] 03/20/2022, 7:49 PM

## 2022-03-20 NOTE — Progress Notes (Signed)
Pt was discharged. Discharge education given and pt reported no further questions. Pt was given her home medications from pharmacy. IV removed. Pt was stable and not in any distress. Transported to car via wheelchair.

## 2022-03-20 NOTE — TOC Progression Note (Signed)
Transition of Care [facility]) - Progression Note    Patient Details  Name: Alicia Villegas MRN: [id number] Date of Birth: 10/28/2000  Transition of Care [facility]) CM/SW [place], RN Phone Number:[phone]  03/20/2022, 2:00 PM  Clinical Narrative:     Transition of Care (TOC) Screening Note   Patient Details  Name: Alicia Villegas Date of Birth: [birth year]   [facility] of [facility]) CM/SW Contact:    Angelita Ingles, RN Phone Number: 03/20/2022, 2:00 PM    Transition of Care Department [facility]) has reviewed patient and no TOC needs have been identified at this time. We will continue to monitor patient advancement through interdisciplinary progression rounds. If new patient transition needs arise, please place a TOC consult.           Expected Discharge Plan and Services                                               Social Determinants of Health (SDOH) Interventions SDOH Screenings   Food Insecurity: Food Insecurity Present (11/15/2021)  Housing: Low Risk  (11/15/2021)  Transportation Needs: No Transportation Needs (11/15/2021)  Utilities: Not At Risk (11/15/2021)  Depression (PHQ2-9): Low Risk  (11/08/2021)  Tobacco Use: Low Risk  (01/23/2022)    Readmission Risk Interventions     No data to display

## 2022-03-21 LAB — BPAM RBC
Blood Product Expiration Date: 202403062359
Blood Product Expiration Date: 202403212359
ISSUE DATE / TIME: 202402130618
ISSUE DATE / TIME: 202402130913
Unit Type and Rh: 600
Unit Type and Rh: 6200

## 2022-03-21 LAB — TYPE AND SCREEN
ABO/RH(D): AB POS
Antibody Screen: POSITIVE
Unit division: 0
Unit division: 0

## 2022-04-06 IMAGING — CR DG CHEST 2V
2 series · 2 of 2 positions shown · non-contrast
Comparison: Chest radiograph dated 03/08/2021.

CLINICAL DATA: Chest pain.

EXAM:
CHEST - 2 VIEW

[w chest lat]
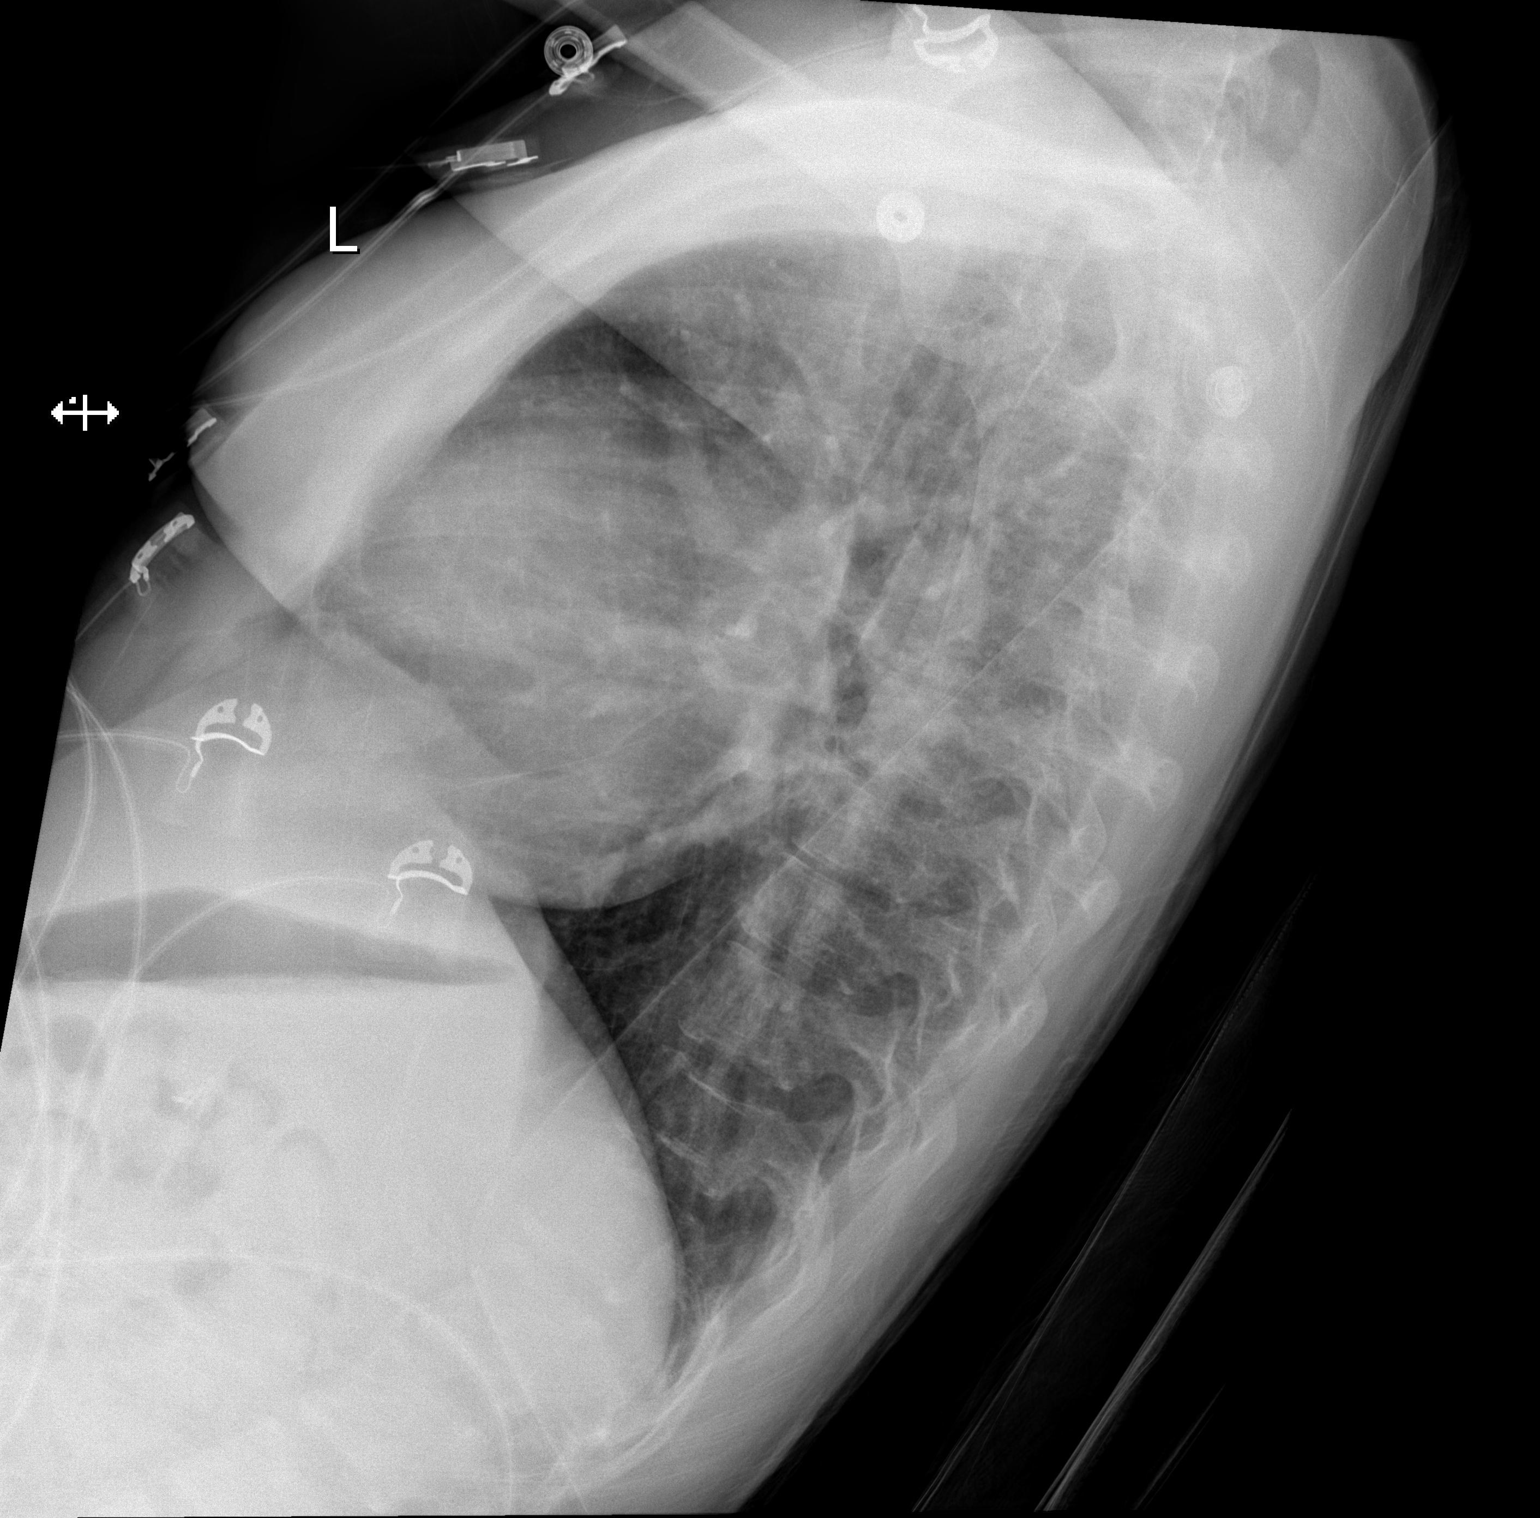

[x chest ap]
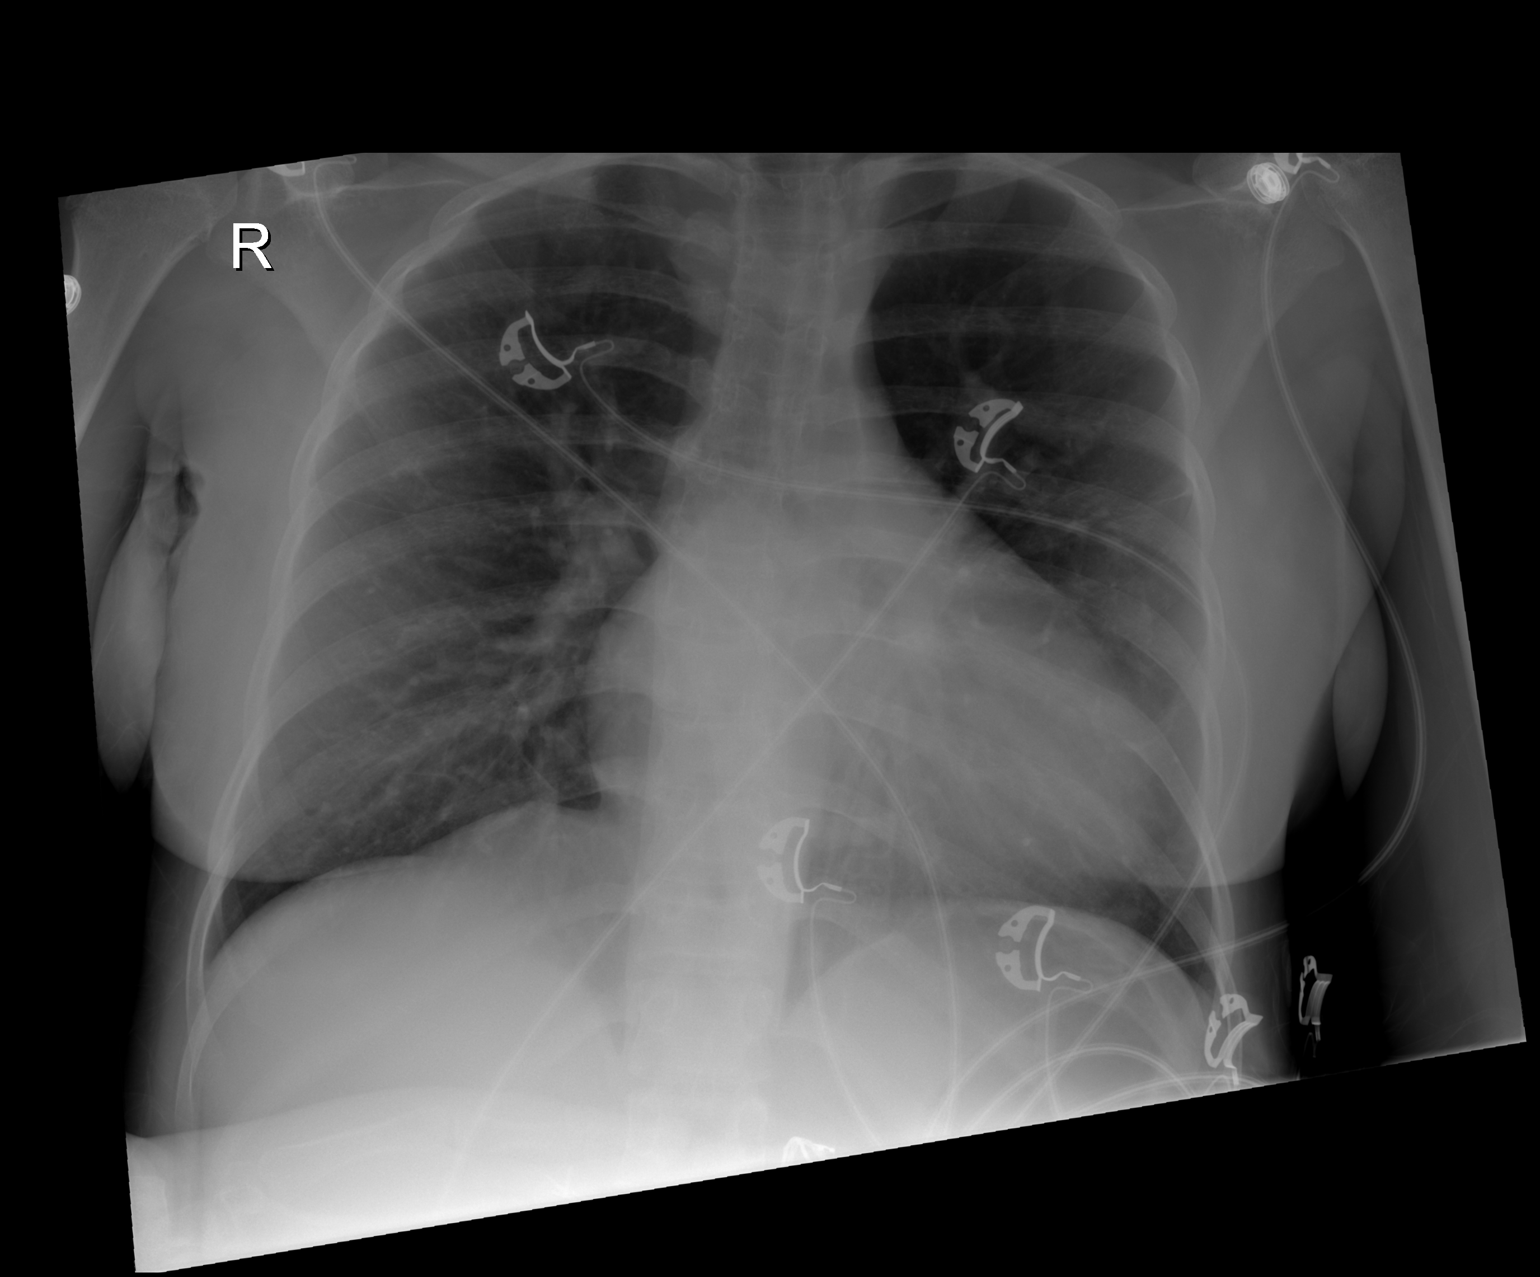

[2 of 2 positions shown; findings below may reference images not displayed]

FINDINGS: No focal consolidation, pleural effusion or pneumothorax. Borderline
cardiomegaly. No acute osseous pathology.
IMPRESSION: No acute cardiopulmonary process. Borderline cardiomegaly.

## 2022-04-17 ENCOUNTER — Ambulatory Visit (INDEPENDENT_AMBULATORY_CARE_PROVIDER_SITE_OTHER): Payer: Medicaid Other | Admitting: Nurse Practitioner

## 2022-04-17 VITALS — BP 99/45 | HR 93 | Temp 98.1°F | Ht 61.0 in | Wt 109.4 lb

## 2022-04-17 DIAGNOSIS — S61219A Laceration without foreign body of unspecified finger without damage to nail, initial encounter: Secondary | ICD-10-CM

## 2022-04-17 DIAGNOSIS — Z23 Encounter for immunization: Secondary | ICD-10-CM

## 2022-04-17 NOTE — Assessment & Plan Note (Signed)
Laceration site on right first finger clean and dry no drainage, bleeding noted, wound will approximated.  Has Mild tenderness on palpation  Patient encouraged to keep site clean and dry leave open to air to promote healing Report redness increased pain greenish discharge

## 2022-04-17 NOTE — Patient Instructions (Addendum)
Please keep site clean and dry, leave open to air if not bleeding.    It is important that you exercise regularly at least 30 minutes 5 times a week as tolerated  Think about what you will eat, plan ahead. Choose " clean, green, fresh or frozen" over canned, processed or packaged foods which are more sugary, salty and fatty. 70 to 75% of food eaten should be vegetables and fruit. Three meals at set times with snacks allowed between meals, but they must be fruit or vegetables. Aim to eat over a 12 hour period , example 7 am to 7 pm, and STOP after  your last meal of the day. Drink water,generally about 64 ounces per day, no other drink is as healthy. Fruit juice is best enjoyed in a healthy way, by EATING the fruit.  Thanks for choosing [facility] we consider it a privelige to serve you.

## 2022-04-17 NOTE — Assessment & Plan Note (Signed)
Patient educated on CDC recommendation for the TDAP vaccine. Verbal consent was obtained from the patient, vaccine administered by nurse, no sign of adverse reactions noted at this time. Patient education on arm soreness and use of tylenol  for this patient  was discussed. Patient educated on the signs and symptoms of adverse effect and advise to contact the office if they occur. 

## 2022-04-17 NOTE — Progress Notes (Signed)
Acute Office Visit  Subjective:     Patient ID: Alicia Villegas, female    DOB: 12/01/2000, 22 y.o.   MRN: [id number]  Chief Complaint  Patient presents with   Hand Injury    HPI 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  She presents today because of left first finger laceration, stated that she had a knife cut yesterday at home while cooking.  She denies fever, chills, malaise.  Last TDAP vaccine was in 2014    Review of Systems  Constitutional: Negative.   Respiratory: Negative.    Cardiovascular: Negative.   Genitourinary: Negative.   Musculoskeletal: Negative.   Neurological: Negative.   Psychiatric/Behavioral: Negative.          Objective:    BP (!) 99/45   Pulse 93   Temp 98.1 F (36.7 C)   Ht '5\' 1"'$  (1.549 m)   Wt 109 lb 6.4 oz (49.6 kg)   LMP 03/12/2022   SpO2 97%   BMI 20.67 kg/m    Physical Exam Constitutional:      General: She is not in acute distress.    Appearance: Normal appearance. She is not ill-appearing, toxic-appearing or diaphoretic.  Eyes:     General: Scleral icterus present.     Extraocular Movements: Extraocular movements intact.  Cardiovascular:     Rate and Rhythm: Normal rate and regular rhythm.     Pulses: Normal pulses.     Heart sounds: Normal heart sounds. No murmur heard.    No friction rub. No gallop.  Pulmonary:     Effort: Pulmonary effort is normal. No respiratory distress.     Breath sounds: Normal breath sounds. No stridor. No wheezing, rhonchi or rales.  Chest:     Chest wall: No tenderness.  Abdominal:     General: There is no distension.     Palpations: Abdomen is soft.     Tenderness: There is no abdominal tenderness.  Skin:    General: Skin is warm and dry.     Findings: No erythema.     Comments: Laceration site on right first finger clean and dry no drainage, bleeding  noted, wound will approximated.  Has Mild tenderness on palpation  Neurological:     Mental Status: She is alert and oriented to person, place, and time.     Motor: No weakness.     Coordination: Coordination normal.     Gait: Gait normal.  Psychiatric:        Behavior: Behavior normal.        Thought Content: Thought content normal.        Judgment: Judgment normal.     No results found for any visits on 04/17/22.      Assessment & Plan:   Problem List Items Addressed This Visit       Other   Finger laceration, initial encounter - Primary    Laceration site on right first finger clean and dry no drainage, bleeding noted, wound will approximated.  Has Mild tenderness on palpation  Patient encouraged to keep site clean and dry leave open to air to promote healing Report redness increased pain greenish discharge       Need for diphtheria-tetanus-pertussis (Tdap) vaccine    Patient educated on CDC recommendation for the TDAP vaccine. Verbal consent was obtained from the patient, vaccine administered by nurse, no sign of adverse reactions noted at  this time. Patient education on arm soreness and use of tylenol for this patient  was discussed. Patient educated on the signs and symptoms of adverse effect and advise to contact the office if they occur.       Relevant Orders   Tdap vaccine greater than or equal to 7yo IM    No orders of the defined types were placed in this encounter.   No follow-ups on file.  Renee Rival, FNP

## 2022-04-24 ENCOUNTER — Ambulatory Visit (INDEPENDENT_AMBULATORY_CARE_PROVIDER_SITE_OTHER): Payer: Medicaid Other | Admitting: Family Medicine

## 2022-04-24 VITALS — BP 99/51 | HR 94 | Temp 98.0°F | Ht 61.0 in | Wt 110.0 lb

## 2022-04-24 DIAGNOSIS — E559 Vitamin D deficiency, unspecified: Secondary | ICD-10-CM

## 2022-04-24 DIAGNOSIS — D571 Sickle-cell disease without crisis: Secondary | ICD-10-CM | POA: Diagnosis not present

## 2022-04-24 LAB — POCT URINALYSIS DIPSTICK
Bilirubin, UA: NEGATIVE
Glucose, UA: NEGATIVE
Ketones, UA: NEGATIVE
Leukocytes, UA: NEGATIVE
Nitrite, UA: NEGATIVE
Protein, UA: NEGATIVE
Spec Grav, UA: >=1.03 — AB (ref 1.010–1.025)
Urobilinogen, UA: 1 E.U./dL
pH, UA: 6 (ref 5.0–8.0)
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
## 2022-04-24 NOTE — Patient Instructions (Signed)
Living With Sickle Cell Disease Living with a long-term condition, such as sickle cell disease, can be a challenge. It can affect both your physical and mental health. You may not have total control over your condition. But proper care and treatment can help manage the effects of the disease so you can feel good and lead an active life. You can take steps to manage your condition and stay as healthy as possible. How does sickle cell disease affect me? Sickle cell disease can cause challenges that affect your quality of life. You may get sick more often as a result of organ damage and infections. Sometimes you may need to stay in the hospital. Learn how to recognize that you are not feeling well and that you may be getting sick. What actions can I take to manage my condition?  The goals of treatment are to control your symptoms and prevent and treat problems. Work with your health care provider to create a treatment plan that works for you. Taking an active role in managing your condition can help you feel more in control of your situation. Ask about possible side effects of medicines that your health care provider recommends. Discuss how you feel about having those side effects. Keeping a healthy lifestyle can help you manage your condition. This includes eating a healthy diet, getting enough sleep, and getting regular exercise. Sickle cell disease may affect your ability to take care of your basic needs. Tell your health care provider if you have concerns about any of these needs: Access to food. Housing. Safe drinking water and other utilities. Safety in your home and community. Work or school. Transportation. Paying for health care. Your health care provider may be able to connect you with community resources that can help you. How to manage stress  Living with sickle cell disease can be stressful. This disease can have a big impact on your mental health. Talk with your health care provider  about ways to reduce your stress or if you have concerns about your mental health.  To cope with stress, try: Keeping a stress diary. This can help you learn what causes your stress to start (figure out your triggers) and how to control your response to those triggers. Spending time doing things that you enjoy, such as: Hobbies. Being outdoors. Spending time with friends and people who make you laugh. Doing yoga, muscle relaxation, deep breathing, or mindfulness practices. Expressing yourself through journal writing, art, crafting, poetry, or playing music. Staying positive about your health. Try to accept that you cannot control your condition perfectly. Follow these instructions at home: Medicines Take over-the-counter and prescription medicines only as told by your health care provider. If you were prescribed antibiotics, take them as told by your health care provider. Do not stop taking them even if you start to feel better. If you develop a fever, do not take medicines to reduce the fever right away. This could cover up another problem. Contact your health care provider. Eating and drinking Drink enough fluid to keep your urine pale yellow. Drink more in hot weather and during exercise. Limit or avoid drinking alcohol. Eat a balanced and nutritious diet. Eat plenty of fruits, vegetables, whole grains, and lean protein. Take vitamins and supplements as told by your health care provider. Traveling When traveling, keep these with you: Your medical information. The names of your health care providers. Your medicines. If you have to travel by air, ask about precautions you should take. Managing pain Work with   your health care provider to create a pain management plan that works for you. The plan may include: Ways to reduce or manage your pain at home, such as: Using a heating pad. Taking a warm bath. Using healthy ways to distract you from the pain, such as hobbies or  reading. Practicing ways to relax, such as doing yoga or listening to music. Getting massages. Doing exercises or stretches as told by a physical therapist. Tracking how pain affects your daily life functions. When to seek help. Who to contact and what to do in case of a pain emergency. General instructions Do not use any products that contain nicotine or tobacco. These products include cigarettes, chewing tobacco, and vaping devices, such as e-cigarettes. These lower blood oxygen levels. If you need help quitting, ask your health care provider. Consider wearing a medical alert bracelet. Use an app or journal to track your symptoms, assess your level of pain and fatigue, and keep track of your medicines. Avoid the following: High altitudes. Very high or low temperatures and big changes in temperature. Activities that will lower your oxygen levels, such as mountain climbing or doing exercise that takes a lot of effort. Stay up to date on: Your treatment plan. Learn as much as you can about your condition. Health screenings. This will help prevent problems or catch them early on. Vaccines. This will help prevent infection. Wash your hands often with soap and water to help prevent infections. Wash them for at least 20 seconds each time. Keep all follow-up visits. Regular follow-up with your health care provider can help you better manage your condition. Where to find support You can find help and support through: Talking with a therapist or taking part in support groups. Sickle Cell Disease Foundation of America: www.sicklecelldisease.org Where to find more information Centers for Disease Control and Prevention: www.cdc.gov American Society of Hematology: www.hematology.org Contact a health care provider if: Your symptoms get worse. You have new symptoms. You have a fever. Get help right away if: You have a painful erection of the penis that lasts a long time (priapism). You become  short of breath or are having trouble breathing. You have pain that cannot be controlled with medicine. You have any signs of a stroke. "BE FAST" is an easy way to remember the main warning signs: B - Balance. Dizziness, sudden trouble walking, or loss of balance. E - Eyes. Trouble seeing or a change in how you see. F - Face. Sudden weakness or loss of feeling of the face. The face or eyelid may droop on one side. A - Arms. Weakness or loss of feeling in an arm. This happens all of a sudden and most often on one side of the body. S - Speech. Sudden trouble speaking, slurred speech, or trouble understanding what people say. T - Time. Time to call emergency services. Write down what time symptoms started. You have other signs of a stroke, such as: A sudden, very bad headache with no known cause. Feeling like you may vomit (nausea). Vomiting. Seizure. These symptoms may be an emergency. Get help right away. Call 911. Do not wait to see if the symptoms will go away. Do not drive yourself to the hospital. Also, get help right away if: You have strong feelings of sadness or loss of hope, or you have thoughts about hurting yourself or others. Take one of these steps if you feel like you may hurt yourself or others, or have thoughts about taking your own life:   Go to your nearest emergency room. Call 911. Call the National Suicide Prevention Lifeline at 1-800-273-8255 or 988. This is open 24 hours a day. Text the Crisis Text Line at 741741. Summary Proper care and treatment can help manage the effects of sickle cell disease so you can feel good and lead an active life. The goals of treatment are to control your symptoms and prevent and treat problems. Taking an active role in managing your condition can help you feel more in control of your situation. Work with your health care provider to create a pain management plan that works for you. Get medical help right away as told by your health care  provider. This information is not intended to replace advice given to you by your health care provider. Make sure you discuss any questions you have with your health care provider. Document Revised: 05/01/2021 Document Reviewed: 05/01/2021 Elsevier Patient Education  2023 Elsevier Inc.  

## 2022-05-01 ENCOUNTER — Other Ambulatory Visit: Payer: Medicaid Other

## 2022-05-01 ENCOUNTER — Other Ambulatory Visit: Payer: Self-pay

## 2022-05-01 ENCOUNTER — Other Ambulatory Visit: Payer: Self-pay | Admitting: Family Medicine

## 2022-05-01 DIAGNOSIS — D571 Sickle-cell disease without crisis: Secondary | ICD-10-CM | POA: Diagnosis not present

## 2022-05-02 LAB — CMP14+CBC/D/PLT+FER+RETIC+V...
ALT: 9 IU/L (ref 0–32)
AST: 46 IU/L — ABNORMAL HIGH (ref 0–40)
Albumin/Globulin Ratio: 1.4 (ref 1.2–2.2)
Albumin: 4.6 g/dL (ref 4.0–5.0)
Alkaline Phosphatase: 94 IU/L (ref 42–106)
BUN/Creatinine Ratio: 11 (ref 9–23)
BUN: 6 mg/dL (ref 6–20)
Basophils Absolute: 0.1 10*3/uL (ref 0.0–0.2)
Basos: 1 %
Bilirubin Total: 1.8 mg/dL — ABNORMAL HIGH (ref 0.0–1.2)
CO2: 10 mmol/L — ABNORMAL LOW (ref 20–29)
Calcium: 9.5 mg/dL (ref 8.7–10.2)
Chloride: 110 mmol/L — ABNORMAL HIGH (ref 96–106)
Creatinine, Ser: 0.54 mg/dL — ABNORMAL LOW (ref 0.57–1.00)
EOS (ABSOLUTE): 0.1 10*3/uL (ref 0.0–0.4)
Eos: 2 %
Ferritin: 694 ng/mL — ABNORMAL HIGH (ref 15–150)
Globulin, Total: 3.3 g/dL (ref 1.5–4.5)
Glucose: 97 mg/dL (ref 70–99)
Hematocrit: 22.4 % — ABNORMAL LOW (ref 34.0–46.6)
Hemoglobin: 7.6 g/dL — ABNORMAL LOW (ref 11.1–15.9)
Immature Grans (Abs): 0 10*3/uL (ref 0.0–0.1)
Immature Granulocytes: 0 %
Lymphocytes Absolute: 4.2 10*3/uL — ABNORMAL HIGH (ref 0.7–3.1)
Lymphs: 49 %
MCH: 31.5 pg (ref 26.6–33.0)
MCHC: 33.9 g/dL (ref 31.5–35.7)
MCV: 93 fL (ref 79–97)
Monocytes Absolute: 0.8 10*3/uL (ref 0.1–0.9)
Monocytes: 10 %
NRBC: 2 % — ABNORMAL HIGH (ref 0–0)
Neutrophils Absolute: 3.2 10*3/uL (ref 1.4–7.0)
Neutrophils: 38 %
Platelets: 449 10*3/uL (ref 150–450)
Potassium: 4.6 mmol/L (ref 3.5–5.2)
RBC: 2.41 x10E6/uL — CL (ref 3.77–5.28)
RDW: 19.6 % — ABNORMAL HIGH (ref 11.7–15.4)
Retic Ct Pct: 16.7 % — ABNORMAL HIGH (ref 0.6–2.6)
Sodium: 144 mmol/L (ref 134–144)
Total Protein: 7.9 g/dL (ref 6.0–8.5)
Vit D, 25-Hydroxy: 14.7 ng/mL — ABNORMAL LOW (ref 30.0–100.0)
WBC: 8.5 10*3/uL (ref 3.4–10.8)
eGFR: 135 mL/min/{1.73_m2} (ref >59)

## 2022-06-04 ENCOUNTER — Inpatient Hospital Stay (HOSPITAL_COMMUNITY)
Admission: AD | Admit: 2022-06-04 | Discharge: 2022-06-08 | DRG: 812 | Disposition: A | Discharge status: Home / Self Care | Payer: Medicaid Other | Source: Ambulatory Visit | Attending: Internal Medicine | Admitting: Internal Medicine

## 2022-06-04 ENCOUNTER — Telehealth (HOSPITAL_COMMUNITY): Payer: Self-pay | Admitting: General Practice

## 2022-06-04 ENCOUNTER — Encounter (HOSPITAL_COMMUNITY): Payer: Self-pay | Admitting: Family Medicine

## 2022-06-04 DIAGNOSIS — Z9049 Acquired absence of other specified parts of digestive tract: Secondary | ICD-10-CM

## 2022-06-04 DIAGNOSIS — R5383 Other fatigue: Secondary | ICD-10-CM | POA: Diagnosis present

## 2022-06-04 DIAGNOSIS — D5700 Hb-SS disease with crisis, unspecified: Principal | ICD-10-CM | POA: Diagnosis present

## 2022-06-04 DIAGNOSIS — R7402 Elevation of levels of lactic acid dehydrogenase (LDH): Secondary | ICD-10-CM | POA: Diagnosis present

## 2022-06-04 DIAGNOSIS — D72829 Elevated white blood cell count, unspecified: Secondary | ICD-10-CM | POA: Diagnosis present

## 2022-06-04 DIAGNOSIS — R110 Nausea: Secondary | ICD-10-CM | POA: Diagnosis present

## 2022-06-04 DIAGNOSIS — Z5941 Food insecurity: Secondary | ICD-10-CM

## 2022-06-04 DIAGNOSIS — D638 Anemia in other chronic diseases classified elsewhere: Secondary | ICD-10-CM | POA: Diagnosis present

## 2022-06-04 DIAGNOSIS — R1084 Generalized abdominal pain: Secondary | ICD-10-CM | POA: Diagnosis present

## 2022-06-04 DIAGNOSIS — Z9081 Acquired absence of spleen: Secondary | ICD-10-CM

## 2022-06-04 DIAGNOSIS — Z79899 Other long term (current) drug therapy: Secondary | ICD-10-CM

## 2022-06-04 DIAGNOSIS — R109 Unspecified abdominal pain: Secondary | ICD-10-CM | POA: Diagnosis present

## 2022-06-04 DIAGNOSIS — Z8613 Personal history of malaria: Secondary | ICD-10-CM

## 2022-06-04 LAB — CBC WITH DIFFERENTIAL/PLATELET
Abs Immature Granulocytes: 0.13 10*3/uL — ABNORMAL HIGH (ref 0.00–0.07)
Basophils Absolute: 0 10*3/uL (ref 0.0–0.1)
Basophils Relative: 0 %
Eosinophils Absolute: 0 10*3/uL (ref 0.0–0.5)
Eosinophils Relative: 0 %
HCT: 20 % — ABNORMAL LOW (ref 36.0–46.0)
Hemoglobin: 7 g/dL — ABNORMAL LOW (ref 12.0–15.0)
Immature Granulocytes: 1 %
Lymphocytes Relative: 26 %
Lymphs Abs: 3.1 10*3/uL (ref 0.7–4.0)
MCH: 32.7 pg (ref 26.0–34.0)
MCHC: 35 g/dL (ref 30.0–36.0)
MCV: 93.5 fL (ref 80.0–100.0)
Monocytes Absolute: 1.4 10*3/uL — ABNORMAL HIGH (ref 0.1–1.0)
Monocytes Relative: 11 %
Neutro Abs: 7.5 10*3/uL (ref 1.7–7.7)
Neutrophils Relative %: 62 %
Platelets: 332 10*3/uL (ref 150–400)
RBC: 2.14 MIL/uL — ABNORMAL LOW (ref 3.87–5.11)
RDW: 24.8 % — ABNORMAL HIGH (ref 11.5–15.5)
WBC: 12.2 10*3/uL — ABNORMAL HIGH (ref 4.0–10.5)
nRBC: 3.1 % — ABNORMAL HIGH (ref 0.0–0.2)

## 2022-06-04 LAB — RETICULOCYTES
Immature Retic Fract: 37.5 % — ABNORMAL HIGH (ref 2.3–15.9)
RBC.: 2.11 MIL/uL — ABNORMAL LOW (ref 3.87–5.11)
Retic Count, Absolute: 450.7 10*3/uL — ABNORMAL HIGH (ref 19.0–186.0)
Retic Ct Pct: 21.4 % — ABNORMAL HIGH (ref 0.4–3.1)

## 2022-06-04 LAB — URINALYSIS, ROUTINE W REFLEX MICROSCOPIC
Bilirubin Urine: NEGATIVE
Glucose, UA: NEGATIVE mg/dL
Ketones, ur: 5 mg/dL — AB
Leukocytes,Ua: NEGATIVE
Nitrite: NEGATIVE
Protein, ur: NEGATIVE mg/dL
Specific Gravity, Urine: 1.006 (ref 1.005–1.030)
pH: 5 (ref 5.0–8.0)

## 2022-06-04 LAB — COMPREHENSIVE METABOLIC PANEL
ALT: 13 U/L (ref 0–44)
AST: 44 U/L — ABNORMAL HIGH (ref 15–41)
Albumin: 4.5 g/dL (ref 3.5–5.0)
Alkaline Phosphatase: 71 U/L (ref 38–126)
Anion gap: 12 (ref 5–15)
BUN: 7 mg/dL (ref 6–20)
CO2: 16 mmol/L — ABNORMAL LOW (ref 22–32)
Calcium: 9 mg/dL (ref 8.9–10.3)
Chloride: 106 mmol/L (ref 98–111)
Creatinine, Ser: 0.55 mg/dL (ref 0.44–1.00)
GFR, Estimated: >60 mL/min (ref >60)
Glucose, Bld: 83 mg/dL (ref 70–99)
Potassium: 3.7 mmol/L (ref 3.5–5.1)
Sodium: 134 mmol/L — ABNORMAL LOW (ref 135–145)
Total Bilirubin: 4.5 mg/dL — ABNORMAL HIGH (ref 0.3–1.2)
Total Protein: 7.9 g/dL (ref 6.5–8.1)

## 2022-06-04 LAB — TYPE AND SCREEN: Antibody Screen: POSITIVE

## 2022-06-04 LAB — LACTATE DEHYDROGENASE: LDH: 576 U/L — ABNORMAL HIGH (ref 98–192)

## 2022-06-04 MED ORDER — HYDROMORPHONE HCL 1 MG/ML IJ SOLN: 0.5000 mg | INTRAMUSCULAR | Status: DC

## 2022-06-04 MED ORDER — KETOROLAC TROMETHAMINE 30 MG/ML IJ SOLN
15.0000 mg | Freq: Once | INTRAMUSCULAR | Status: AC
  Administered 2022-06-04: 15 mg via INTRAVENOUS
  Filled 2022-06-04: qty 1

## 2022-06-04 MED ORDER — ORAL CARE MOUTH RINSE: 15.0000 mL | OROMUCOSAL | Status: DC | PRN

## 2022-06-04 MED ORDER — FENTANYL CITRATE PF 50 MCG/ML IJ SOSY
12.5000 ug | PREFILLED_SYRINGE | INTRAMUSCULAR | Status: DC | PRN
  Administered 2022-06-04 – 2022-06-07 (×7): 12.5 ug via INTRAVENOUS
  Filled 2022-06-04 (×8): qty 1

## 2022-06-04 MED ORDER — IOHEXOL 9 MG/ML PO SOLN
500.0000 mL | ORAL | Status: AC
  Administered 2022-06-04: 500 mL via ORAL

## 2022-06-04 MED ORDER — PROPRANOLOL HCL 20 MG PO TABS
20.0000 mg | ORAL_TABLET | Freq: Every day | ORAL | Status: DC
  Administered 2022-06-05 – 2022-06-07 (×2): 20 mg via ORAL
  Filled 2022-06-04 (×3): qty 2

## 2022-06-04 MED ORDER — ACETAMINOPHEN 500 MG PO TABS
1000.0000 mg | ORAL_TABLET | Freq: Once | ORAL | Status: AC
  Administered 2022-06-04: 1000 mg via ORAL
  Filled 2022-06-04: qty 2

## 2022-06-04 MED ORDER — KETOROLAC TROMETHAMINE 15 MG/ML IJ SOLN
15.0000 mg | Freq: Four times a day (QID) | INTRAMUSCULAR | Status: DC
  Administered 2022-06-04 – 2022-06-07 (×5): 15 mg via INTRAVENOUS
  Filled 2022-06-04 (×7): qty 1

## 2022-06-04 MED ORDER — DIPHENHYDRAMINE HCL 25 MG PO CAPS
25.0000 mg | ORAL_CAPSULE | Freq: Four times a day (QID) | ORAL | Status: DC | PRN
  Administered 2022-06-07: 25 mg via ORAL
  Filled 2022-06-04: qty 1

## 2022-06-04 MED ORDER — POLYETHYLENE GLYCOL 3350 17 G PO PACK: 17.0000 g | PACK | Freq: Every day | ORAL | Status: DC | PRN

## 2022-06-04 MED ORDER — IOHEXOL 9 MG/ML PO SOLN
ORAL | Status: AC
  Filled 2022-06-04: qty 1000

## 2022-06-04 MED ORDER — SODIUM CHLORIDE (PF) 0.9 % IJ SOLN
INTRAMUSCULAR | Status: AC
  Filled 2022-06-04: qty 50

## 2022-06-04 MED ORDER — HYDROXYUREA 100 MG/ML ORAL SUSPENSION: 1200.0000 mg | Freq: Every day | ORAL | Status: DC

## 2022-06-04 MED ORDER — ENOXAPARIN SODIUM 40 MG/0.4ML IJ SOSY
40.0000 mg | PREFILLED_SYRINGE | INTRAMUSCULAR | Status: DC
  Filled 2022-06-04 (×2): qty 0.4

## 2022-06-04 MED ORDER — VOXELOTOR 500 MG PO TABS: 1500.0000 mg | ORAL_TABLET | Freq: Every day | ORAL | Status: DC

## 2022-06-04 MED ORDER — ONDANSETRON HCL 4 MG/2ML IJ SOLN: 4.0000 mg | Freq: Four times a day (QID) | INTRAMUSCULAR | Status: DC | PRN

## 2022-06-04 MED ORDER — MORPHINE SULFATE (PF) 4 MG/ML IV SOLN
2.0000 mg | INTRAVENOUS | Status: DC | PRN
  Administered 2022-06-04 (×2): 2 mg via INTRAVENOUS
  Filled 2022-06-04 (×2): qty 1

## 2022-06-04 MED ORDER — SENNOSIDES-DOCUSATE SODIUM 8.6-50 MG PO TABS
1.0000 | ORAL_TABLET | Freq: Two times a day (BID) | ORAL | Status: DC
  Administered 2022-06-05 – 2022-06-07 (×5): 1 via ORAL
  Filled 2022-06-04 (×8): qty 1

## 2022-06-04 MED ORDER — SODIUM CHLORIDE 0.45 % IV SOLN: INTRAVENOUS | Status: DC

## 2022-06-04 NOTE — Progress Notes (Signed)
Patient admitted to the day hospital for treatment of sickle cell pain crisis. Patient reported pain rated 9/10 in the stomach and body weakness. Patient given 2 pushes of IV Morphine and hydrated with IV fluids. Patient transferred to [facility] long 6 E room 16. Reported  pain at transfer was 7/10. Report was given Isabelle Course, Charity fundraiser. Patient alert, oriented and transported in a wheelchair.

## 2022-06-04 NOTE — TOC Progression Note (Signed)
Transition of Care [facility]) - Progression Note    Patient Details  Name: Alicia Villegas MRN: [id number] Date of Birth: [birth year]  Transition of Care [facility]) CM/SW Contact  Beckie Busing, RN Phone [phone]  06/04/2022, 3:37 PM  Clinical Narrative:     Transition of Care (TOC) Screening Note   Patient Details  Name: Alicia Villegas Date of Birth: 05-28-2000   Transition of Care [facility]) CM/SW Contact:    Beckie Busing, RN Phone Number: 06/04/2022, 3:37 PM    Transition of Care Department [facility]) has reviewed patient and no TOC needs have been identified at this time. We will continue to monitor patient advancement through interdisciplinary progression rounds. If new patient transition needs arise, please place a TOC consult.          Expected Discharge Plan and Services                                               Social Determinants of Health (SDOH) Interventions SDOH Screenings   Food Insecurity: Food Insecurity Present (11/15/2021)  Housing: Low Risk  (11/15/2021)  Transportation Needs: No Transportation Needs (11/15/2021)  Utilities: Not At Risk (11/15/2021)  Depression (PHQ2-9): Low Risk  (11/08/2021)  Tobacco Use: Low Risk  (06/04/2022)    Readmission Risk Interventions     No data to display

## 2022-06-04 NOTE — Telephone Encounter (Signed)
Patient came to the [facility] lobby, requesting to be seen at the day hospital due to pain in the mid "stomach"  rated at 9/10 since 06/01/22. Denied chest pain, fever, diarrhea, abdominal pain, nausea/vomitting. Screened negative for Covid-19 symptoms. Admitted to having means of transportation without driving self after treatment. Last took Tylenol in [place], could not remember the dosage. Per provider, patient can be seen at the day hospital for treatment. Patient notified, verbalized understanding.

## 2022-06-04 NOTE — H&P (Signed)
[facility] History and Physical   Date: 06/04/2022  Patient name: Alicia Villegas Medical record number: [id number] Date of birth: [birth year] Age: 22 y.o. Gender: female PCP: Massie Maroon, FNP  Attending physician: Quentin Angst, MD  Chief Complaint: Sickle cell pain   History of Present Illness: Alicia Villegas is a 22 year old female with a medical history significant for sickle cell disease and anemia of chronic disease presents to sickle cell day infusion clinic with complaints of pain to abdomen and lower back that is consistent with previous sickle cell pain crisis.  Patient states that pain intensity has been elevated over the past several days and unrelieved by home ibuprofen.  Patient is opiate nave.  She typically does not take any opiate medications at home.  Patient has generally well-controlled sickle cell disease with infrequent pain crises.  Patient's baseline hemoglobin is 6-7 g/dL.  Patient also endorses weakness and fatigue.  She typically feels weak and fatigued when her hemoglobin is low.  Patient denies blurry vision, dizziness, chest pain, or shortness of breath.  No urinary symptoms.  Patient states that she has had abdominal pain and nausea over the past several days.  Abdominal pain is worsened with eating.  She has not identified any inciting factors concerning crisis.  Patient has not had any recent travel or sick contacts.  Sickle cell day infusion center course: Complete metabolic panel shows sodium 134, CO2 16, BUN 7, creatinine 0.55, AST 44, ALT 13, and total bilirubin 4.5. LDH elevated at 576.  Complete blood count shows hemoglobin 7.0, WBCs 12.2, and platelets 332,000.  Reticulocyte percentage 21.4.  Patient's pain persists despite IV morphine, Toradol, Tylenol, and IV fluids.  Patient will be admitted in observation for further management of her pain crisis.  Meds: Medications Prior to Admission  Medication Sig Dispense Refill Last Dose    acetaminophen (TYLENOL) 500 MG tablet Take 500 mg by mouth every 6 (six) hours as needed for mild pain.      hydroxyurea (HYDREA) 100 mg/mL SUSP Take 1,200 mg by mouth at bedtime.      hydroxyurea (HYDREA) oral suspension 100 mg/ml mixture Take by mouth.      ibuprofen (ADVIL) 200 MG tablet Take 200-800 mg by mouth every 6 (six) hours as needed for mild pain or headache.      omeprazole (PRILOSEC) 20 MG capsule Take 1 capsule (20 mg total) by mouth daily. 30 capsule 3    ondansetron (ZOFRAN) 4 MG tablet Take 1 tablet (4 mg total) by mouth every 8 (eight) hours as needed for nausea or vomiting. 20 tablet 0    prochlorperazine (COMPAZINE) 10 MG tablet Take 10 mg by mouth every 6 (six) hours as needed for nausea or vomiting ("for headaches").      propranolol (INDERAL) 20 MG tablet Take 1 tablet (20 mg total) by mouth daily. 30 tablet 0    Vitamin D, Ergocalciferol, (DRISDOL) 1.25 MG (50000 UNIT) CAPS capsule Take 1 capsule (50,000 Units total) by mouth every 7 (seven) days. 5 capsule 0    voxelotor (OXBRYTA) 500 MG TABS tablet Take 1,500 mg by mouth at bedtime.      Water For Irrigation, Sterile (STERILE WATER FOR IRRIGATION)  (Patient not taking: Reported on 04/17/2022)       Allergies: Patient has no known allergies. 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   Sickle cell anemia ([name])    Sickle cell anemia ([name])    type SS   Past Surgical History:  Procedure Laterality Date   CHOLECYSTECTOMY     DG GALL BLADDER     HERNIA REPAIR     SPLENECTOMY     SPLENECTOMY, TOTAL     Family History  Family history unknown: Yes   Social History   Socioeconomic History   Marital status: Single    Spouse name: Not on file   Number of children: Not on file   Years of education: Not on file   Highest education level: Not on file   Occupational History   Not on file  Tobacco Use   Smoking status: Never   Smokeless tobacco: Never  Vaping Use   Vaping Use: Never used  Substance and Sexual Activity   Alcohol use: No   Drug use: No   Sexual activity: Never    Birth control/protection: Abstinence  Other Topics Concern   Not on file  Social History Narrative   ** Merged History Encounter **       ** Data from: 01/02/16 Enc Dept: [facility]   Family moved from [place] to [place] around 2008 (age 61).  Lives with parents & siblings age 8, 6, 25, 56, 9, 2.  No smokers in the house; No pets in the house.       ** Data from: 03/16/16 Enc Dept: [facility]   From [place], many siblings at home.   Social Determinants of Health   Financial Resource Strain: Not on file  Food Insecurity: Food Insecurity Present (11/15/2021)   Hunger Vital Sign    Worried About Running Out of Food in the Last Year: Often true    Ran Out of Food in the Last Year: Often true  Transportation Needs: No Transportation Needs (11/15/2021)   PRAPARE - Administrator, Civil Service (Medical): No    Lack of Transportation (Non-Medical): No  Physical Activity: Not on file  Stress: Not on file  Social Connections: Not on file  Intimate Partner Violence: Not At Risk (11/15/2021)   Humiliation, Afraid, Rape, and Kick questionnaire    Fear of Current or Ex-Partner: No    Emotionally Abused: No    Physically Abused: No    Sexually Abused: No    Review of Systems  Constitutional: Negative.   HENT: Negative.    Eyes: Negative.   Cardiovascular: Negative.   Gastrointestinal:  Positive for abdominal pain.  Genitourinary: Negative.   Skin: Negative.   Neurological: Negative.      Physical Exam: There were no vitals taken for this visit. Physical Exam Constitutional:      Appearance: Normal appearance.  Eyes:     Pupils: Pupils are equal, round, and reactive to light.  Cardiovascular:     Rate and Rhythm:  Normal rate.  Pulmonary:     Effort: Pulmonary effort is normal.  Abdominal:     General: Bowel sounds are normal.  Neurological:     Mental Status: She is alert.      Lab results: No results found for this or any previous visit (from the past 24 hour(s)).  Imaging results:  No results found.   Assessment & Plan: Sickle cell disease with pain crisis: Patient will be admitted to [facility] patient is very opiate nave, pain managed with fentanyl 12.5 mcg IV every 2 hours as  needed Toradol 15 mg every 6 hours Tylenol 650 mg every 6 hours as needed IV fluids, 0.45% saline at 50 mL/h Monitor vital signs very closely, reevaluate pain scale regularly, and supplemental oxygen as needed Patient will be reevaluated for pain in the context of function and relationship to baseline as care progresses.  Symptomatic anemia: Patient endorses fatigue.  Hemoglobin is 7.0 g/dL.  Will transfuse 1 unit PRBCs.  Follow labs in AM.  Abdominal pain: Patient has generalized abdominal pain.  Endorses nausea.  Antiemetics as needed.  Advance diet as tolerated.  Review CT of abdomen and pelvis as results become available.  Leukocytosis: WBCs mildly elevated at 12.2.  Review CT of abdomen and pelvis.  Urinalysis pending.  Monitor closely without antibiotics.     Nolon Nations  APRN, MSN, FNP-C Patient Care [facility] [address] [place], [place] [address] [phone]  06/04/2022, 8:42 AM

## 2022-06-05 ENCOUNTER — Observation Stay (HOSPITAL_COMMUNITY): Payer: Medicaid Other

## 2022-06-05 DIAGNOSIS — Z79899 Other long term (current) drug therapy: Secondary | ICD-10-CM | POA: Diagnosis not present

## 2022-06-05 DIAGNOSIS — R110 Nausea: Secondary | ICD-10-CM | POA: Diagnosis not present

## 2022-06-05 DIAGNOSIS — K5730 Diverticulosis of large intestine without perforation or abscess without bleeding: Secondary | ICD-10-CM | POA: Diagnosis not present

## 2022-06-05 DIAGNOSIS — R7402 Elevation of levels of lactic acid dehydrogenase (LDH): Secondary | ICD-10-CM | POA: Diagnosis not present

## 2022-06-05 DIAGNOSIS — R1084 Generalized abdominal pain: Secondary | ICD-10-CM | POA: Diagnosis not present

## 2022-06-05 DIAGNOSIS — D5700 Hb-SS disease with crisis, unspecified: Secondary | ICD-10-CM | POA: Diagnosis not present

## 2022-06-05 DIAGNOSIS — Z9081 Acquired absence of spleen: Secondary | ICD-10-CM | POA: Diagnosis not present

## 2022-06-05 DIAGNOSIS — Z9049 Acquired absence of other specified parts of digestive tract: Secondary | ICD-10-CM | POA: Diagnosis not present

## 2022-06-05 DIAGNOSIS — Z8613 Personal history of malaria: Secondary | ICD-10-CM | POA: Diagnosis not present

## 2022-06-05 DIAGNOSIS — D72829 Elevated white blood cell count, unspecified: Secondary | ICD-10-CM | POA: Diagnosis not present

## 2022-06-05 DIAGNOSIS — Z5941 Food insecurity: Secondary | ICD-10-CM | POA: Diagnosis not present

## 2022-06-05 DIAGNOSIS — R5383 Other fatigue: Secondary | ICD-10-CM | POA: Diagnosis not present

## 2022-06-05 LAB — BASIC METABOLIC PANEL
Anion gap: 12 (ref 5–15)
BUN: 7 mg/dL (ref 6–20)
CO2: 16 mmol/L — ABNORMAL LOW (ref 22–32)
Calcium: 8.6 mg/dL — ABNORMAL LOW (ref 8.9–10.3)
Chloride: 104 mmol/L (ref 98–111)
Creatinine, Ser: 0.42 mg/dL — ABNORMAL LOW (ref 0.44–1.00)
GFR, Estimated: >60 mL/min (ref >60)
Glucose, Bld: 73 mg/dL (ref 70–99)
Potassium: 3.6 mmol/L (ref 3.5–5.1)
Sodium: 132 mmol/L — ABNORMAL LOW (ref 135–145)

## 2022-06-05 LAB — HCG, QUANTITATIVE, PREGNANCY: hCG, Beta Chain, Quant, S: <1 m[IU]/mL (ref <5)

## 2022-06-05 LAB — CBC
HCT: 20 % — ABNORMAL LOW (ref 36.0–46.0)
Hemoglobin: 7.2 g/dL — ABNORMAL LOW (ref 12.0–15.0)
MCH: 33.8 pg (ref 26.0–34.0)
MCHC: 36 g/dL (ref 30.0–36.0)
MCV: 93.9 fL (ref 80.0–100.0)
Platelets: 351 10*3/uL (ref 150–400)
RBC: 2.13 MIL/uL — ABNORMAL LOW (ref 3.87–5.11)
RDW: 25.6 % — ABNORMAL HIGH (ref 11.5–15.5)
WBC: 11.6 10*3/uL — ABNORMAL HIGH (ref 4.0–10.5)
nRBC: 3 % — ABNORMAL HIGH (ref 0.0–0.2)

## 2022-06-05 MED ORDER — ACETAMINOPHEN 325 MG PO TABS
650.0000 mg | ORAL_TABLET | Freq: Four times a day (QID) | ORAL | Status: DC | PRN
  Administered 2022-06-05 – 2022-06-06 (×2): 650 mg via ORAL
  Filled 2022-06-05 (×2): qty 2

## 2022-06-05 NOTE — Progress Notes (Signed)
Subjective: Alicia Villegas is a 22 year old female with a medical history significant for sickle cell disease and anemia of chronic disease was admitted for sickle cell pain crisis. Patient continues to have pain mostly to abdomen.  Her abdominal pain is generalized.  Reviewed CT of abdomen and pelvis, no acute findings. Patient states the pain intensity is 5/10.  She denies chest pain, dizziness, shortness of breath, urinary symptoms, nausea, vomiting, or diarrhea.  Objective:  Vital signs in last 24 hours:  Vitals:   06/04/22 2003 06/04/22 2346 06/05/22 0338 06/05/22 1019  BP: (!) 109/53 111/66 107/60 (!) 90/52  Pulse: 88 80 83 83  Resp: 16 18 16    Temp: 97.8 F (36.6 C) 98.1 F (36.7 C) 98.2 F (36.8 C) 98.3 F (36.8 C)  TempSrc: Oral Oral Oral Oral  SpO2: 93% 90% 90%     Intake/Output from previous day:   Intake/Output Summary (Last 24 hours) at 06/05/2022 1343 Last data filed at 06/05/2022 1200 Gross per 24 hour  Intake 2224.27 ml  Output --  Net 2224.27 ml    Physical Exam: General: Alert, awake, oriented x3, in no acute distress.  HEENT: [place]/AT PEERL, EOMI Neck: Trachea midline,  no masses, no thyromegal,y no JVD, no carotid bruit OROPHARYNX:  Moist, No exudate/ erythema/lesions.  Heart: Regular rate and rhythm, without murmurs, rubs, gallops, PMI non-displaced, no heaves or thrills on palpation.  Lungs: Clear to auscultation, no wheezing or rhonchi noted. No increased vocal fremitus resonant to percussion  Abdomen: Soft, nontender, nondistended, positive bowel sounds, no masses no hepatosplenomegaly noted..  Neuro: No focal neurological deficits noted cranial nerves II through XII grossly intact. DTRs 2+ bilaterally upper and lower extremities. Strength 5 out of 5 in bilateral upper and lower extremities. Musculoskeletal: No warm swelling or erythema around joints, no spinal tenderness noted. Psychiatric: Patient alert and oriented x3, good insight and cognition, good  recent to remote recall. Lymph node survey: No cervical axillary or inguinal lymphadenopathy noted.  Lab Results:  Basic Metabolic Panel:    Component Value Date/Time   NA 132 (L) 06/05/2022 0052   NA 144 05/01/2022 1051   K 3.6 06/05/2022 0052   CL 104 06/05/2022 0052   CO2 16 (L) 06/05/2022 0052   BUN 7 06/05/2022 0052   BUN 6 05/01/2022 1051   CREATININE 0.42 (L) 06/05/2022 0052   CREATININE 0.30 (L) 05/02/2015 1442   GLUCOSE 73 06/05/2022 0052   CALCIUM 8.6 (L) 06/05/2022 0052   CBC:    Component Value Date/Time   WBC 11.6 (H) 06/05/2022 0052   HGB 7.2 (L) 06/05/2022 0052   HGB 7.6 (L) 05/01/2022 1051   HCT 20.0 (L) 06/05/2022 0052   HCT 22.4 (L) 05/01/2022 1051   PLT 351 06/05/2022 0052   PLT 449 05/01/2022 1051   MCV 93.9 06/05/2022 0052   MCV 93 05/01/2022 1051   NEUTROABS 7.5 06/04/2022 0945   NEUTROABS 3.2 05/01/2022 1051   LYMPHSABS 3.1 06/04/2022 0945   LYMPHSABS 4.2 (H) 05/01/2022 1051   MONOABS 1.4 (H) 06/04/2022 0945   EOSABS 0.0 06/04/2022 0945   EOSABS 0.1 05/01/2022 1051   BASOSABS 0.0 06/04/2022 0945   BASOSABS 0.1 05/01/2022 1051    No results found for this or any previous visit (from the past 240 hour(s)).  Studies/Results: CT ABDOMEN PELVIS WO CONTRAST  Result Date: 06/05/2022 CLINICAL DATA:  Sickle cell patient with abdominal and back pain worsening over the past several days with weakness and leukocytosis. EXAM: CT ABDOMEN AND  PELVIS WITHOUT CONTRAST TECHNIQUE: Multidetector CT imaging of the abdomen and pelvis was performed following the standard protocol without IV contrast. RADIATION DOSE REDUCTION: This exam was performed according to the departmental dose-optimization program which includes automated exposure control, adjustment of the mA and/or kV according to patient size and/or use of iterative reconstruction technique. COMPARISON:  CT without contrast 01/10/2020 FINDINGS: Lower chest: There is mild cardiomegaly, small pericardial  effusion increased from the prior study. The intraventricular blood pool is less dense than the myocardium consistent with anemia. Lung bases show scattered linear scar-like opacities without infiltrates. Hepatobiliary: Gallbladder is absent as before, without biliary dilatation. The unenhanced liver is unremarkable. Pancreas: Unremarkable without contrast. 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 Spleen: Surgically absent. Adrenals/Urinary Tract: There is no adrenal mass. No focal abnormality is seen in the unenhanced kidneys. There is no urinary stone or obstruction. There is no bladder thickening. Stomach/Bowel: The stomach and unopacified small bowel are unremarkable. The appendix is normal. The colon wall is normal thickness. There are scattered diverticula left colon without evidence of focal diverticulitis. Vascular/Lymphatic: No significant vascular findings are present. No enlarged abdominal or pelvic lymph nodes. Reproductive: The uterus is intact. Right adnexal area is unremarkable. On the left, a 3 cm ovarian cystic lesion is noted with homogeneous attenuation and Hounsfield density of 18. No follow-up imaging is recommended (reference: JACR 2020 Feb;17(2):248-254). No other adnexal abnormality is seen. Other: No abdominal wall hernia or abnormality. No abdominopelvic ascites. Musculoskeletal: There are central endplate defects in the spine consistent with history of sickle cell disease. No acute or other significant osseous findings. IMPRESSION: 1. No acute noncontrast CT findings in the abdomen or pelvis. 2. Diverticulosis without evidence of diverticulitis. 3. Mild cardiomegaly with small pericardial effusion. 4. Anemia. 5. Central endplate defects in the spine consistent with history of sickle cell disease. 6. 3 cm left ovarian simple-appearing cyst. No follow-up imaging recommended. Electronically  Signed   By: Almira Bar M.D.   On: 06/05/2022 05:51    Medications: Scheduled Meds:  enoxaparin (LOVENOX) injection  40 mg Subcutaneous Q24H   hydroxyurea  1,200 mg Oral QHS   ketorolac  15 mg Intravenous Q6H   propranolol  20 mg Oral Daily   senna-docusate  1 tablet Oral BID   voxelotor  1,500 mg Oral QHS   Continuous Infusions:  sodium chloride 100 mL/hr at 06/05/22 1200   PRN Meds:.acetaminophen, diphenhydrAMINE, fentaNYL (SUBLIMAZE) injection, ondansetron (ZOFRAN) IV, mouth rinse, polyethylene glycol  Consultants: None  Procedures: None  Antibiotics: none  Assessment/Plan: Active Problems:   Sickle cell pain crisis ([name])  Sickle cell disease with pain crisis: Decrease IV fluids to 0.45% saline at 50 mL/h Toradol 15 mg IV every 6 hours Tylenol 650 mg every 6 hours as needed Patient is opiate nave.  Fentanyl 12.5 mcg every 2 hours as needed for severe pain Monitor vital signs very closely, reevaluate pain scale regularly, and supplemental oxygen as needed.  Anemia of chronic disease: Patient's hemoglobin is 7.2 g/dL.  She is typically not transfused unless hemoglobin and less than 7.  Follow labs in AM.  Please defer to sickle cell team for any nonemergent blood transfusions.  Abdominal pain: Patient continues to have abdominal pain.  Secondary to sickle cell crisis.  Reviewed CT of abdomen and pelvis, unremarkable.  Nausea resolved.  Patient tolerating diet well.  Leukocytosis: Stable.  No antibiotics at this time.  Monitor labs in AM.   Code Status: Full Code Family Communication: N/A Disposition Plan: Not yet ready for discharge  Nolon Nations  APRN, MSN, FNP-C Patient Care [facility] [address] [place], [place] [address] [phone]  If 7PM-7AM, please contact night-coverage.  06/05/2022, 1:43 PM  LOS: 0 days

## 2022-06-06 DIAGNOSIS — D5700 Hb-SS disease with crisis, unspecified: Secondary | ICD-10-CM | POA: Diagnosis not present

## 2022-06-06 LAB — CBC
HCT: 17.3 % — ABNORMAL LOW (ref 36.0–46.0)
Hemoglobin: 6.1 g/dL — CL (ref 12.0–15.0)
MCH: 32.8 pg (ref 26.0–34.0)
MCHC: 35.3 g/dL (ref 30.0–36.0)
MCV: 93 fL (ref 80.0–100.0)
Platelets: 322 10*3/uL (ref 150–400)
RBC: 1.86 MIL/uL — ABNORMAL LOW (ref 3.87–5.11)
RDW: 25.2 % — ABNORMAL HIGH (ref 11.5–15.5)
WBC: 8.2 10*3/uL (ref 4.0–10.5)
nRBC: 5.4 % — ABNORMAL HIGH (ref 0.0–0.2)

## 2022-06-06 LAB — BASIC METABOLIC PANEL
Anion gap: 9 (ref 5–15)
BUN: 6 mg/dL (ref 6–20)
CO2: 18 mmol/L — ABNORMAL LOW (ref 22–32)
Calcium: 8.4 mg/dL — ABNORMAL LOW (ref 8.9–10.3)
Chloride: 110 mmol/L (ref 98–111)
Creatinine, Ser: <0.3 mg/dL — ABNORMAL LOW (ref 0.44–1.00)
Glucose, Bld: 88 mg/dL (ref 70–99)
Potassium: 3.3 mmol/L — ABNORMAL LOW (ref 3.5–5.1)
Sodium: 137 mmol/L (ref 135–145)

## 2022-06-06 LAB — TYPE AND SCREEN
ABO/RH(D): AB POS
Unit division: 0

## 2022-06-06 LAB — PREPARE RBC (CROSSMATCH)

## 2022-06-06 LAB — BPAM RBC: Unit Type and Rh: 9500

## 2022-06-06 MED ORDER — ACETAMINOPHEN 325 MG PO TABS
650.0000 mg | ORAL_TABLET | Freq: Once | ORAL | Status: AC
  Administered 2022-06-07: 650 mg via ORAL
  Filled 2022-06-06: qty 2

## 2022-06-06 MED ORDER — SODIUM CHLORIDE 0.9% IV SOLUTION: Freq: Once | INTRAVENOUS | Status: DC

## 2022-06-06 MED ORDER — DIPHENHYDRAMINE HCL 50 MG/ML IJ SOLN
25.0000 mg | Freq: Once | INTRAMUSCULAR | Status: AC
  Administered 2022-06-06: 25 mg via INTRAVENOUS
  Filled 2022-06-06: qty 1

## 2022-06-06 NOTE — Progress Notes (Signed)
Subjective: Alicia Villegas is a 22 year old female with a medical history significant for sickle cell disease and anemia of chronic disease was admitted for sickle cell pain crisis. Patient states that abdominal pain is improving.  Today, patient's hemoglobin is decreased to 6.1 g/dL.  She continues to endorse some fatigue.  Patient states the pain intensity is 5/10.  She denies chest pain, dizziness, shortness of breath, urinary symptoms, nausea, vomiting, or diarrhea.  Objective:  Vital signs in last 24 hours:  Vitals:   06/06/22 0138 06/06/22 0514 06/06/22 1052 06/06/22 1408  BP: (!) 94/57 97/63 (!) 96/54 (!) 98/56  Pulse: 79 76 89 84  Resp: 18 17 20 18   Temp: 98.3 F (36.8 C) 98.3 F (36.8 C) 98.6 F (37 C) 98.3 F (36.8 C)  TempSrc: Oral Oral Oral Oral  SpO2: 94% 91% 91% 90%    Intake/Output from previous day:   Intake/Output Summary (Last 24 hours) at 06/06/2022 1647 Last data filed at 06/05/2022 1813 Gross per 24 hour  Intake 510 ml  Output --  Net 510 ml    Physical Exam: General: Alert, awake, oriented x3, in no acute distress.  HEENT: [place]/AT PEERL, EOMI Neck: Trachea midline,  no masses, no thyromegal,y no JVD, no carotid bruit OROPHARYNX:  Moist, No exudate/ erythema/lesions.  Heart: Regular rate and rhythm, without murmurs, rubs, gallops, PMI non-displaced, no heaves or thrills on palpation.  Lungs: Clear to auscultation, no wheezing or rhonchi noted. No increased vocal fremitus resonant to percussion  Abdomen: Soft, nontender, nondistended, positive bowel sounds, no masses no hepatosplenomegaly noted..  Neuro: No focal neurological deficits noted cranial nerves II through XII grossly intact. DTRs 2+ bilaterally upper and lower extremities. Strength 5 out of 5 in bilateral upper and lower extremities. Musculoskeletal: No warm swelling or erythema around joints, no spinal tenderness noted. Psychiatric: Patient alert and oriented x3, good insight and cognition, good  recent to remote recall. Lymph node survey: No cervical axillary or inguinal lymphadenopathy noted.  Lab Results:  Basic Metabolic Panel:    Component Value Date/Time   NA 137 06/06/2022 0611   NA 144 05/01/2022 1051   K 3.3 (L) 06/06/2022 0611   CL 110 06/06/2022 0611   CO2 18 (L) 06/06/2022 0611   BUN 6 06/06/2022 0611   BUN 6 05/01/2022 1051   CREATININE <0.30 (L) 06/06/2022 0611   CREATININE 0.30 (L) 05/02/2015 1442   GLUCOSE 88 06/06/2022 0611   CALCIUM 8.4 (L) 06/06/2022 0611   CBC:    Component Value Date/Time   WBC 8.2 06/06/2022 0611   HGB 6.1 (LL) 06/06/2022 0611   HGB 7.6 (L) 05/01/2022 1051   HCT 17.3 (L) 06/06/2022 0611   HCT 22.4 (L) 05/01/2022 1051   PLT 322 06/06/2022 0611   PLT 449 05/01/2022 1051   MCV 93.0 06/06/2022 0611   MCV 93 05/01/2022 1051   NEUTROABS 7.5 06/04/2022 0945   NEUTROABS 3.2 05/01/2022 1051   LYMPHSABS 3.1 06/04/2022 0945   LYMPHSABS 4.2 (H) 05/01/2022 1051   MONOABS 1.4 (H) 06/04/2022 0945   EOSABS 0.0 06/04/2022 0945   EOSABS 0.1 05/01/2022 1051   BASOSABS 0.0 06/04/2022 0945   BASOSABS 0.1 05/01/2022 1051    No results found for this or any previous visit (from the past 240 hour(s)).  Studies/Results: CT ABDOMEN PELVIS WO CONTRAST  Result Date: 06/05/2022 CLINICAL DATA:  Sickle cell patient with abdominal and back pain worsening over the past several days with weakness and leukocytosis. EXAM: CT ABDOMEN AND  PELVIS WITHOUT CONTRAST TECHNIQUE: Multidetector CT imaging of the abdomen and pelvis was performed following the standard protocol without IV contrast. RADIATION DOSE REDUCTION: This exam was performed according to the departmental dose-optimization program which includes automated exposure control, adjustment of the mA and/or kV according to patient size and/or use of iterative reconstruction technique. COMPARISON:  CT without contrast 01/10/2020 FINDINGS: Lower chest: There is mild cardiomegaly, small pericardial effusion  increased from the prior study. The intraventricular blood pool is less dense than the myocardium consistent with anemia. Lung bases show scattered linear scar-like opacities without infiltrates. Hepatobiliary: Gallbladder is absent as before, without biliary dilatation. The unenhanced liver is unremarkable. Pancreas: Unremarkable without contrast. 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 Spleen: Surgically absent. Adrenals/Urinary Tract: There is no adrenal mass. No focal abnormality is seen in the unenhanced kidneys. There is no urinary stone or obstruction. There is no bladder thickening. Stomach/Bowel: The stomach and unopacified small bowel are unremarkable. The appendix is normal. The colon wall is normal thickness. There are scattered diverticula left colon without evidence of focal diverticulitis. Vascular/Lymphatic: No significant vascular findings are present. No enlarged abdominal or pelvic lymph nodes. Reproductive: The uterus is intact. Right adnexal area is unremarkable. On the left, a 3 cm ovarian cystic lesion is noted with homogeneous attenuation and Hounsfield density of 18. No follow-up imaging is recommended (reference: JACR 2020 Feb;17(2):248-254). No other adnexal abnormality is seen. Other: No abdominal wall hernia or abnormality. No abdominopelvic ascites. Musculoskeletal: There are central endplate defects in the spine consistent with history of sickle cell disease. No acute or other significant osseous findings. IMPRESSION: 1. No acute noncontrast CT findings in the abdomen or pelvis. 2. Diverticulosis without evidence of diverticulitis. 3. Mild cardiomegaly with small pericardial effusion. 4. Anemia. 5. Central endplate defects in the spine consistent with history of sickle cell disease. 6. 3 cm left ovarian simple-appearing cyst. No follow-up imaging recommended. Electronically Signed    By: Almira Bar M.D.   On: 06/05/2022 05:51    Medications: Scheduled Meds:  sodium chloride   Intravenous Once   acetaminophen  650 mg Oral Once   diphenhydrAMINE  25 mg Intravenous Once   enoxaparin (LOVENOX) injection  40 mg Subcutaneous Q24H   hydroxyurea  1,200 mg Oral QHS   ketorolac  15 mg Intravenous Q6H   propranolol  20 mg Oral Daily   senna-docusate  1 tablet Oral BID   voxelotor  1,500 mg Oral QHS   Continuous Infusions:  sodium chloride 50 mL/hr at 06/06/22 0621   PRN Meds:.acetaminophen, diphenhydrAMINE, fentaNYL (SUBLIMAZE) injection, ondansetron (ZOFRAN) IV, mouth rinse, polyethylene glycol  Consultants: None  Procedures: None  Antibiotics: none  Assessment/Plan: Active Problems:   Abdominal pain   Sickle cell pain crisis ([name])   Anemia of chronic disease  Sickle cell disease with pain crisis: Decrease IV fluids to KVO Toradol 15 mg IV every 6 hours Tylenol 650 mg every 6 hours as needed Patient is opiate nave.  Fentanyl 12.5 mcg every 2 hours as needed for severe pain Monitor vital signs very closely, reevaluate pain scale regularly, and supplemental oxygen as needed.  Anemia of chronic disease: Patient's hemoglobin is decreased to 6.1 g/dL.  Transfused 2 units PRBCs as blood becomes available.  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   Follow labs in AM.  Please defer to sickle cell team for any nonemergent blood transfusions.  Abdominal pain: Patient continues to have abdominal pain.  Secondary to sickle cell crisis.  Reviewed CT of abdomen and pelvis, unremarkable.  Nausea resolved.  Patient tolerating diet well.  Leukocytosis: Stable.  No antibiotics at this time.  Monitor labs in AM.   Code Status: Full Code Family Communication: N/A Disposition Plan: Not yet ready for discharge  Nolon Nations  APRN, MSN, FNP-C Patient Care [facility] [address] [place], [place]  [address] [phone]  If 7PM-7AM, please contact night-coverage.  06/06/2022, 4:47 PM  LOS: 1 day

## 2022-06-07 DIAGNOSIS — D5700 Hb-SS disease with crisis, unspecified: Secondary | ICD-10-CM | POA: Diagnosis not present

## 2022-06-07 LAB — BPAM RBC
Blood Product Expiration Date: 202406012359
ISSUE DATE / TIME: 202405012140

## 2022-06-07 LAB — BASIC METABOLIC PANEL
Anion gap: 9 (ref 5–15)
BUN: 6 mg/dL (ref 6–20)
CO2: 19 mmol/L — ABNORMAL LOW (ref 22–32)
Calcium: 8.6 mg/dL — ABNORMAL LOW (ref 8.9–10.3)
Chloride: 111 mmol/L (ref 98–111)
Creatinine, Ser: 0.31 mg/dL — ABNORMAL LOW (ref 0.44–1.00)
GFR, Estimated: >60 mL/min (ref >60)
Glucose, Bld: 98 mg/dL (ref 70–99)
Potassium: 3.2 mmol/L — ABNORMAL LOW (ref 3.5–5.1)
Sodium: 139 mmol/L (ref 135–145)

## 2022-06-07 LAB — CBC
HCT: 29.1 % — ABNORMAL LOW (ref 36.0–46.0)
Hemoglobin: 10.2 g/dL — ABNORMAL LOW (ref 12.0–15.0)
MCH: 30.6 pg (ref 26.0–34.0)
MCHC: 35.1 g/dL (ref 30.0–36.0)
MCV: 87.4 fL (ref 80.0–100.0)
Platelets: 373 10*3/uL (ref 150–400)
RBC: 3.33 MIL/uL — ABNORMAL LOW (ref 3.87–5.11)
RDW: 22.9 % — ABNORMAL HIGH (ref 11.5–15.5)
WBC: 7.2 10*3/uL (ref 4.0–10.5)
nRBC: 0.3 % — ABNORMAL HIGH (ref 0.0–0.2)

## 2022-06-07 LAB — TYPE AND SCREEN: Unit division: 0

## 2022-06-07 LAB — LACTATE DEHYDROGENASE: LDH: 566 U/L — ABNORMAL HIGH (ref 98–192)

## 2022-06-08 DIAGNOSIS — D5700 Hb-SS disease with crisis, unspecified: Secondary | ICD-10-CM | POA: Diagnosis not present

## 2022-06-08 LAB — BPAM RBC
Blood Product Expiration Date: 202406102359
ISSUE DATE / TIME: 202405020028
Unit Type and Rh: 5100

## 2022-06-08 LAB — TYPE AND SCREEN

## 2022-06-08 LAB — CBC
HCT: 33.1 % — ABNORMAL LOW (ref 36.0–46.0)
Hemoglobin: 11.9 g/dL — ABNORMAL LOW (ref 12.0–15.0)
MCH: 30.8 pg (ref 26.0–34.0)
MCHC: 36 g/dL (ref 30.0–36.0)
MCV: 85.8 fL (ref 80.0–100.0)
Platelets: 365 10*3/uL (ref 150–400)
RBC: 3.86 MIL/uL — ABNORMAL LOW (ref 3.87–5.11)
RDW: 22.7 % — ABNORMAL HIGH (ref 11.5–15.5)
WBC: 7.1 10*3/uL (ref 4.0–10.5)
nRBC: 2.7 % — ABNORMAL HIGH (ref 0.0–0.2)

## 2022-06-08 NOTE — Progress Notes (Signed)
Subjective: Alicia Villegas is a 22 year old female with a medical history significant for sickle cell disease and anemia of chronic disease was admitted for sickle cell pain crisis. Patient states that abdominal pain is improving.  She states that she is having menstrual cramping today, menses started overnight.  Patient's hemoglobin has returned above baseline.  She is status post 2 units PRBCs on yesterday.  Patient says that fatigue is improved.  Patient states the pain intensity is 5/10.  She denies chest pain, dizziness, shortness of breath, urinary symptoms, nausea, vomiting, or diarrhea.  Objective:  Vital signs in last 24 hours:  Vitals:   06/07/22 1403 06/07/22 1754 06/07/22 2054 06/08/22 0019  BP: 105/64 116/75 98/60 (!) 87/49  Pulse: 73 72 69 67  Resp: 12  16 14   Temp: 98.4 F (36.9 C) 98.9 F (37.2 C) 98 F (36.7 C) 98 F (36.7 C)  TempSrc: Oral Oral Oral Oral  SpO2: 92% 94% 94% 92%    Intake/Output from previous day:   Intake/Output Summary (Last 24 hours) at 06/08/2022 0128 Last data filed at 06/07/2022 1800 Gross per 24 hour  Intake 2093.47 ml  Output --  Net 2093.47 ml    Physical Exam: General: Alert, awake, oriented x3, in no acute distress.  HEENT: [place]/AT PEERL, EOMI Neck: Trachea midline,  no masses, no thyromegal,y no JVD, no carotid bruit OROPHARYNX:  Moist, No exudate/ erythema/lesions.  Heart: Regular rate and rhythm, without murmurs, rubs, gallops, PMI non-displaced, no heaves or thrills on palpation.  Lungs: Clear to auscultation, no wheezing or rhonchi noted. No increased vocal fremitus resonant to percussion  Abdomen: Soft, nontender, nondistended, positive bowel sounds, no masses no hepatosplenomegaly noted..  Neuro: No focal neurological deficits noted cranial nerves II through XII grossly intact. DTRs 2+ bilaterally upper and lower extremities. Strength 5 out of 5 in bilateral upper and lower extremities. Musculoskeletal: No warm swelling or erythema  around joints, no spinal tenderness noted. Psychiatric: Patient alert and oriented x3, good insight and cognition, good recent to remote recall. Lymph node survey: No cervical axillary or inguinal lymphadenopathy noted.  Lab Results:  Basic Metabolic Panel:    Component Value Date/Time   NA 139 06/07/2022 2110   NA 144 05/01/2022 1051   K 3.2 (L) 06/07/2022 2110   CL 111 06/07/2022 2110   CO2 19 (L) 06/07/2022 2110   BUN 6 06/07/2022 2110   BUN 6 05/01/2022 1051   CREATININE 0.31 (L) 06/07/2022 2110   CREATININE 0.30 (L) 05/02/2015 1442   GLUCOSE 98 06/07/2022 2110   CALCIUM 8.6 (L) 06/07/2022 2110   CBC:    Component Value Date/Time   WBC 7.2 06/07/2022 2110   HGB 10.2 (L) 06/07/2022 2110   HGB 7.6 (L) 05/01/2022 1051   HCT 29.1 (L) 06/07/2022 2110   HCT 22.4 (L) 05/01/2022 1051   PLT 373 06/07/2022 2110   PLT 449 05/01/2022 1051   MCV 87.4 06/07/2022 2110   MCV 93 05/01/2022 1051   NEUTROABS 7.5 06/04/2022 0945   NEUTROABS 3.2 05/01/2022 1051   LYMPHSABS 3.1 06/04/2022 0945   LYMPHSABS 4.2 (H) 05/01/2022 1051   MONOABS 1.4 (H) 06/04/2022 0945   EOSABS 0.0 06/04/2022 0945   EOSABS 0.1 05/01/2022 1051   BASOSABS 0.0 06/04/2022 0945   BASOSABS 0.1 05/01/2022 1051    No results found for this or any previous visit (from the past 240 hour(s)).  Studies/Results: No results found.  Medications: Scheduled Meds:  sodium chloride   Intravenous Once  enoxaparin (LOVENOX) injection  40 mg Subcutaneous Q24H   hydroxyurea  1,200 mg Oral QHS   ketorolac  15 mg Intravenous Q6H   propranolol  20 mg Oral Daily   senna-docusate  1 tablet Oral BID   voxelotor  1,500 mg Oral QHS   Continuous Infusions:  sodium chloride Stopped (06/07/22 0351)   PRN Meds:.acetaminophen, diphenhydrAMINE, fentaNYL (SUBLIMAZE) injection, ondansetron (ZOFRAN) IV, mouth rinse, polyethylene glycol  Consultants: None  Procedures: None  Antibiotics: none  Assessment/Plan: Principal  Problem:   Sickle cell pain crisis ([name]) Active Problems:   Abdominal pain   Anemia of chronic disease  Sickle cell disease with pain crisis: Decrease IV fluids to KVO Toradol 15 mg IV every 6 hours Tylenol 650 mg every 6 hours as needed Patient is opiate nave.  Fentanyl 12.5 mcg every 2 hours as needed for severe pain Monitor vital signs very closely, reevaluate pain scale regularly, and supplemental oxygen as needed.  Anemia of chronic disease: Hemoglobin 8.0.  Patient is status post 2 units PRBCs..     Follow labs in AM.  Please defer to sickle cell team for any nonemergent blood transfusions.  Abdominal pain: Patient continues to have abdominal pain.  Secondary to sickle cell crisis.  Reviewed CT of abdomen and pelvis, unremarkable.  Nausea resolved.  Patient tolerating diet well.  Leukocytosis: Stable.  No antibiotics at this time.  Monitor labs in AM.   Code Status: Full Code Family Communication: N/A Disposition Plan: Not yet ready for discharge.  Discharge planned for 06/08/2022.  Nolon Nations  APRN, MSN, FNP-C Patient Care [facility] [address] [place], [place] [address] [phone]  If 7PM-7AM, please contact night-coverage.  06/08/2022, 1:28 AM  LOS: 3 days

## 2022-06-08 NOTE — Discharge Summary (Signed)
Physician Discharge Summary  Alicia Villegas YQM:[id number] DOB: 2000-08-19 DOA: 06/04/2022  PCP: Massie Maroon, FNP  Admit date: 06/04/2022  Discharge date: 06/08/2022  Discharge Diagnoses:  Principal Problem:   Sickle cell pain crisis ([name]) Active Problems:   Abdominal pain   Anemia of chronic disease   Discharge Condition: Stable  Disposition:  Pt is discharged home in good condition and is to follow up with Massie Maroon, FNP this week to have labs evaluated. Alicia Villegas is instructed to increase activity slowly and balance with rest for the next few days, and use prescribed medication to complete treatment of pain  Diet: Regular Wt Readings from Last 3 Encounters:  04/24/22 49.9 kg  04/17/22 49.6 kg  03/19/22 44.7 kg    History of present illness:    Hospital Course:  Patient was admitted for sickle cell pain crisis and managed appropriately with IVF, IV Dilaudid via PCA and IV Toradol, as well as other adjunct therapies per sickle cell pain management protocols.  Patient was therefore discharged home today in a hemodynamically stable condition.   Alicia Villegas will follow-up with PCP within 1 week of this discharge. Alicia Villegas was counseled extensively about nonpharmacologic means of pain management, patient verbalized understanding and was appreciative of  the care received during this admission.   We discussed the need for good hydration, monitoring of hydration status, avoidance of heat, cold, stress, and infection triggers. We discussed the need to be adherent with taking Hydrea and other home medications. Patient was reminded of the need to seek medical attention immediately if any symptom of bleeding, anemia, or infection occurs.  Discharge Exam: Vitals:   06/08/22 0438 06/08/22 0811  BP: (!) 101/51 (!) 94/57  Pulse: 64 77  Resp: 16 18  Temp: 98.1 F (36.7 C) 98 F (36.7 C)  SpO2: 93% 92%   Vitals:   06/07/22 2054 06/08/22 0019 06/08/22 0438 06/08/22 0811  BP:  98/60 (!) 87/49 (!) 101/51 (!) 94/57  Pulse: 69 67 64 77  Resp: 16 14 16 18   Temp: 98 F (36.7 C) 98 F (36.7 C) 98.1 F (36.7 C) 98 F (36.7 C)  TempSrc: Oral Oral Oral Oral  SpO2: 94% 92% 93% 92%    General appearance : Awake, alert, not in any distress. Speech Clear. Not toxic looking HEENT: Atraumatic and Normocephalic, pupils equally reactive to light and accomodation Neck: Supple, no JVD. No cervical lymphadenopathy.  Chest: Good air entry bilaterally, no added sounds  CVS: S1 S2 regular, no murmurs.  Abdomen: Bowel sounds present, Non tender and not distended with no gaurding, rigidity or rebound. Extremities: B/L Lower Ext shows no edema, both legs are warm to touch Neurology: Awake alert, and oriented X 3, CN II-XII intact, Non focal Skin: No Rash  Discharge Instructions  Discharge Instructions     Discharge patient   Complete by: As directed    Discharge disposition: 01-Home or Self Care   Discharge patient date: 06/08/2022      Allergies as of 06/08/2022   No Known Allergies      Medication List     TAKE these medications    acetaminophen 500 MG tablet Commonly known as: TYLENOL Take 500 mg by mouth every 6 (six) hours as needed for mild pain.   hydroxyurea 100 mg/mL Susp Commonly known as: HYDREA Take 1,200 mg by mouth at bedtime.   ibuprofen 200 MG tablet Commonly known as: ADVIL Take 200-800 mg by mouth every 6 (six) hours as needed for  mild pain or headache.   omeprazole 20 MG capsule Commonly known as: PRILOSEC Take 1 capsule (20 mg total) by mouth daily.   ondansetron 4 MG tablet Commonly known as: Zofran Take 1 tablet (4 mg total) by mouth every 8 (eight) hours as needed for nausea or vomiting.   prochlorperazine 10 MG tablet Commonly known as: COMPAZINE Take 10 mg by mouth every 6 (six) hours as needed for nausea or vomiting ("for headaches").   propranolol 20 MG tablet Commonly known as: INDERAL Take 1 tablet (20 mg total) by mouth  daily.   sterile water for irrigation   Vitamin D (Ergocalciferol) 1.25 MG (50000 UNIT) Caps capsule Commonly known as: DRISDOL Take 1 capsule (50,000 Units total) by mouth every 7 (seven) days.   voxelotor 500 MG Tabs tablet Commonly known as: OXBRYTA Take 1,500 mg by mouth at bedtime.        The results of significant diagnostics from this hospitalization (including imaging, microbiology, ancillary and laboratory) are listed below for reference.    Significant Diagnostic Studies: CT ABDOMEN PELVIS WO CONTRAST  Result Date: 06/05/2022 CLINICAL DATA:  Sickle cell patient with abdominal and back pain worsening over the past several days with weakness and leukocytosis. EXAM: CT ABDOMEN AND PELVIS WITHOUT CONTRAST TECHNIQUE: Multidetector CT imaging of the abdomen and pelvis was performed following the standard protocol without IV contrast. RADIATION DOSE REDUCTION: This exam was performed according to the departmental dose-optimization program which includes automated exposure control, adjustment of the mA and/or kV according to patient size and/or use of iterative reconstruction technique. COMPARISON:  CT without contrast 01/10/2020 FINDINGS: Lower chest: There is mild cardiomegaly, small pericardial effusion increased from the prior study. The intraventricular blood pool is less dense than the myocardium consistent with anemia. Lung bases show scattered linear scar-like opacities without infiltrates. Hepatobiliary: Gallbladder is absent as before, without biliary dilatation. The unenhanced liver is unremarkable. Pancreas: Unremarkable without contrast. 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 Spleen: Surgically absent. Adrenals/Urinary Tract: There is no adrenal mass. No focal abnormality is seen in the unenhanced kidneys. There is no urinary stone or obstruction. There is no bladder thickening.  Stomach/Bowel: The stomach and unopacified small bowel are unremarkable. The appendix is normal. The colon wall is normal thickness. There are scattered diverticula left colon without evidence of focal diverticulitis. Vascular/Lymphatic: No significant vascular findings are present. No enlarged abdominal or pelvic lymph nodes. Reproductive: The uterus is intact. Right adnexal area is unremarkable. On the left, a 3 cm ovarian cystic lesion is noted with homogeneous attenuation and Hounsfield density of 18. No follow-up imaging is recommended (reference: JACR 2020 Feb;17(2):248-254). No other adnexal abnormality is seen. Other: No abdominal wall hernia or abnormality. No abdominopelvic ascites. Musculoskeletal: There are central endplate defects in the spine consistent with history of sickle cell disease. No acute or other significant osseous findings. IMPRESSION: 1. No acute noncontrast CT findings in the abdomen or pelvis. 2. Diverticulosis without evidence of diverticulitis. 3. Mild cardiomegaly with small pericardial effusion. 4. Anemia. 5. Central endplate defects in the spine consistent with history of sickle cell disease. 6. 3 cm left ovarian simple-appearing cyst. No follow-up imaging recommended. Electronically Signed   By: Almira Bar M.D.   On: 06/05/2022 05:51    Microbiology: No results found for this or any previous visit (from the past 240 hour(s)).   Labs: Basic Metabolic Panel: Recent Labs  Lab 06/04/22  0945 06/05/22 0052 06/06/22 0611 06/07/22 2110  NA 134* 132* 137 139  K 3.7 3.6 3.3* 3.2*  CL 106 104 110 111  CO2 16* 16* 18* 19*  GLUCOSE 83 73 88 98  BUN 7 7 6 6   CREATININE 0.55 0.42* <0.30* 0.31*  CALCIUM 9.0 8.6* 8.4* 8.6*   Liver Function Tests: Recent Labs  Lab 06/04/22 0945  AST 44*  ALT 13  ALKPHOS 71  BILITOT 4.5*  PROT 7.9  ALBUMIN 4.5   No results for input(s): "LIPASE", "AMYLASE" in the last 168 hours. No results for input(s): "AMMONIA" in the last 168  hours. CBC: Recent Labs  Lab 06/04/22 0945 06/05/22 0052 06/06/22 0611 06/07/22 2110 06/08/22 0824  WBC 12.2* 11.6* 8.2 7.2 7.1  NEUTROABS 7.5  --   --   --   --   HGB 7.0* 7.2* 6.1* 10.2* 11.9*  HCT 20.0* 20.0* 17.3* 29.1* 33.1*  MCV 93.5 93.9 93.0 87.4 85.8  PLT 332 351 322 373 365   Cardiac Enzymes: No results for input(s): "CKTOTAL", "CKMB", "CKMBINDEX", "TROPONINI" in the last 168 hours. BNP: Invalid input(s): "POCBNP" CBG: No results for input(s): "GLUCAP" in the last 168 hours.  Time coordinating discharge: 50 minutes  Signed:  Julianne Handler  [facility] 06/08/2022, 12:23 PM

## 2022-06-11 ENCOUNTER — Telehealth: Payer: Self-pay | Admitting: *Deleted

## 2022-06-11 NOTE — Transitions of Care (Post Inpatient/ED Visit) (Signed)
   06/11/2022  Name: Alicia Villegas MRN: [id number] DOB: [birth year]  Today's TOC FU Call Status: Today's TOC FU Call Status:: Unsuccessul Call (1st Attempt) Unsuccessful Call (1st Attempt) Date: 06/11/22  Attempted to reach the patient regarding the most recent Inpatient/ED visit.  RNCM spoke with [name] Strader/Pastor/Liaison. Patient does not have a telephone. [name] will reach patient and give her RNCM telephone number and ask patient to contact RNCM.   Follow Up Plan: Additional outreach attempts will be made to reach the patient to complete the Transitions of Care (Post Inpatient/ED visit) call.   Estanislado Emms RN, BSN [place]  Managed [facility] RN Care Coordinator [phone]

## 2022-07-05 ENCOUNTER — Encounter (HOSPITAL_COMMUNITY): Payer: Self-pay | Admitting: *Deleted

## 2022-07-05 ENCOUNTER — Emergency Department (HOSPITAL_COMMUNITY)
Admission: EM | Admit: 2022-07-05 | Discharge: 2022-07-05 | Disposition: A | Discharge status: Home / Self Care | Payer: Medicaid Other | Attending: Emergency Medicine | Admitting: Emergency Medicine

## 2022-07-05 ENCOUNTER — Other Ambulatory Visit: Payer: Self-pay

## 2022-07-05 DIAGNOSIS — D57219 Sickle-cell/Hb-C disease with crisis, unspecified: Secondary | ICD-10-CM | POA: Diagnosis not present

## 2022-07-05 DIAGNOSIS — D5700 Hb-SS disease with crisis, unspecified: Secondary | ICD-10-CM | POA: Diagnosis not present

## 2022-07-05 DIAGNOSIS — M79604 Pain in right leg: Secondary | ICD-10-CM | POA: Insufficient documentation

## 2022-07-05 DIAGNOSIS — M79605 Pain in left leg: Secondary | ICD-10-CM | POA: Insufficient documentation

## 2022-07-05 LAB — RETICULOCYTES
Immature Retic Fract: 25.2 % — ABNORMAL HIGH (ref 2.3–15.9)
RBC.: 2.35 MIL/uL — ABNORMAL LOW (ref 3.87–5.11)
Retic Count, Absolute: 420.5 10*3/uL — ABNORMAL HIGH (ref 19.0–186.0)
Retic Ct Pct: 17.9 % — ABNORMAL HIGH (ref 0.4–3.1)

## 2022-07-05 LAB — CBC WITH DIFFERENTIAL/PLATELET
Abs Immature Granulocytes: 0.04 10*3/uL (ref 0.00–0.07)
Basophils Absolute: 0 10*3/uL (ref 0.0–0.1)
Basophils Relative: 0 %
Eosinophils Absolute: 0.1 10*3/uL (ref 0.0–0.5)
Eosinophils Relative: 1 %
HCT: 23 % — ABNORMAL LOW (ref 36.0–46.0)
Hemoglobin: 8.1 g/dL — ABNORMAL LOW (ref 12.0–15.0)
Immature Granulocytes: 0 %
Lymphocytes Relative: 32 %
Lymphs Abs: 3.1 10*3/uL (ref 0.7–4.0)
MCH: 32.4 pg (ref 26.0–34.0)
MCHC: 35.2 g/dL (ref 30.0–36.0)
MCV: 92 fL (ref 80.0–100.0)
Monocytes Absolute: 0.8 10*3/uL (ref 0.1–1.0)
Monocytes Relative: 9 %
Neutro Abs: 5.7 10*3/uL (ref 1.7–7.7)
Neutrophils Relative %: 58 %
Platelets: 403 10*3/uL — ABNORMAL HIGH (ref 150–400)
RBC: 2.5 MIL/uL — ABNORMAL LOW (ref 3.87–5.11)
RDW: 24.5 % — ABNORMAL HIGH (ref 11.5–15.5)
WBC: 9.7 10*3/uL (ref 4.0–10.5)
nRBC: 0.4 % — ABNORMAL HIGH (ref 0.0–0.2)

## 2022-07-05 LAB — COMPREHENSIVE METABOLIC PANEL
ALT: 15 U/L (ref 0–44)
AST: 41 U/L (ref 15–41)
Albumin: 4.5 g/dL (ref 3.5–5.0)
Alkaline Phosphatase: 72 U/L (ref 38–126)
Anion gap: 8 (ref 5–15)
BUN: 6 mg/dL (ref 6–20)
CO2: 19 mmol/L — ABNORMAL LOW (ref 22–32)
Calcium: 9.2 mg/dL (ref 8.9–10.3)
Chloride: 111 mmol/L (ref 98–111)
Creatinine, Ser: 0.43 mg/dL — ABNORMAL LOW (ref 0.44–1.00)
GFR, Estimated: >60 mL/min (ref >60)
Glucose, Bld: 89 mg/dL (ref 70–99)
Potassium: 3.6 mmol/L (ref 3.5–5.1)
Sodium: 138 mmol/L (ref 135–145)
Total Bilirubin: 1.8 mg/dL — ABNORMAL HIGH (ref 0.3–1.2)
Total Protein: 8 g/dL (ref 6.5–8.1)

## 2022-07-05 LAB — I-STAT BETA HCG BLOOD, ED (MC, WL, AP ONLY): I-stat hCG, quantitative: <5 m[IU]/mL (ref <5)

## 2022-07-05 MED ORDER — DEXTROSE-SODIUM CHLORIDE 5-0.45 % IV SOLN: INTRAVENOUS | Status: DC

## 2022-07-05 MED ORDER — HYDROMORPHONE HCL 1 MG/ML IJ SOLN
0.5000 mg | INTRAMUSCULAR | Status: AC
  Administered 2022-07-05: 0.5 mg via SUBCUTANEOUS
  Filled 2022-07-05: qty 1

## 2022-07-05 MED ORDER — ONDANSETRON HCL 4 MG/2ML IJ SOLN: 4.0000 mg | INTRAMUSCULAR | Status: DC | PRN

## 2022-07-05 MED ORDER — SODIUM CHLORIDE 0.9 % IV SOLN
12.5000 mg | Freq: Once | INTRAVENOUS | Status: AC
  Administered 2022-07-05: 12.5 mg via INTRAVENOUS
  Filled 2022-07-05: qty 12.5

## 2022-07-05 MED ORDER — HYDROMORPHONE HCL 2 MG/ML IJ SOLN: 2.0000 mg | INTRAMUSCULAR | Status: AC

## 2022-07-05 MED ORDER — HYDROMORPHONE HCL 1 MG/ML IJ SOLN: 0.5000 mg | INTRAMUSCULAR | Status: AC

## 2022-07-05 MED ORDER — HYDROMORPHONE HCL 2 MG/ML IJ SOLN
2.0000 mg | INTRAMUSCULAR | Status: AC
  Administered 2022-07-05: 2 mg via INTRAVENOUS
  Filled 2022-07-05: qty 1

## 2022-07-05 NOTE — Discharge Instructions (Signed)
Follow-up with your doctors in the sickle cell clinic.  Return for any new or worse symptoms.

## 2022-07-05 NOTE — ED Provider Notes (Signed)
[place] EMERGENCY DEPARTMENT AT [facility] Provider Note   CSN: [id number] Arrival date & time: 07/05/22  1436     History  Chief Complaint  Patient presents with   Sickle Cell Pain Crisis    Alicia Villegas is a 22 y.o. female.  Patient with onset of bilateral leg pain typical of her sickle cell pain on Tuesday evening.  Her pain medications at home were not helping.  Patient states normally when she does come in she does require admission denies any fevers or cough or any chest pain.  Patient's vital signs Temp 98.7 heart rate is 93 respirations 18 blood pressure 117/75 oxygen saturation 95%.  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  Patient has splenectomy and cholecystectomy.  Patient does not use tobacco products.       Home Medications Prior to Admission medications   Medication Sig Start Date End Date Taking? Authorizing Provider  acetaminophen (TYLENOL) 500 MG tablet Take 500 mg by mouth every 6 (six) hours as needed for mild pain.    [provider]  hydroxyurea (HYDREA) 100 mg/mL SUSP Take 1,200 mg by mouth at bedtime.    [provider]  ibuprofen (ADVIL) 200 MG tablet Take 200-800 mg by mouth every 6 (six) hours as needed for mild pain or headache.    [provider]  omeprazole (PRILOSEC) 20 MG capsule Take 1 capsule (20 mg total) by mouth daily. 12/21/21   Ivonne Andrew, NP  ondansetron (ZOFRAN) 4 MG tablet Take 1 tablet (4 mg total) by mouth every 8 (eight) hours as needed for nausea or vomiting. 12/21/21   Ivonne Andrew, NP  prochlorperazine (COMPAZINE) 10 MG tablet Take 10 mg by mouth every 6 (six) hours as needed for nausea or vomiting ("for headaches").    [provider]  propranolol (INDERAL) 20 MG tablet Take 1 tablet (20 mg total) by mouth daily. 04/28/21   Massie Maroon, FNP  Vitamin D, Ergocalciferol, (DRISDOL) 1.25 MG (50000 UNIT) CAPS  capsule Take 1 capsule (50,000 Units total) by mouth every 7 (seven) days. 11/09/21   Ivonne Andrew, NP  voxelotor (OXBRYTA) 500 MG TABS tablet Take 1,500 mg by mouth at bedtime. 10/19/20   [provider]  Water For Irrigation, Sterile (STERILE WATER FOR IRRIGATION)  02/27/22   [provider]      Allergies    Patient has no known allergies.    Review of Systems   Review of Systems  Constitutional:  Negative for chills and fever.  HENT:  Negative for ear pain and sore throat.   Eyes:  Negative for pain and visual disturbance.  Respiratory:  Negative for cough and shortness of breath.   Cardiovascular:  Negative for chest pain and palpitations.  Gastrointestinal:  Negative for abdominal pain and vomiting.  Genitourinary:  Negative for dysuria and hematuria.  Musculoskeletal:  Positive for myalgias. Negative for arthralgias and back pain.  Skin:  Negative for color change and rash.  Neurological:  Negative for seizures and syncope.  All other systems reviewed and are negative.   Physical Exam Updated Vital Signs BP 106/68   Pulse 71   Temp 98.8 F (37.1 C) (Oral)   Resp 17   LMP 07/04/2022 Comment: negative HCG quantitaitve 06/05/22  SpO2 99%  Physical Exam Vitals and nursing note reviewed.  Constitutional:      General: She is not in acute distress.    Appearance: Normal appearance.  She is well-developed.  HENT:     Head: Normocephalic and atraumatic.  Eyes:     Extraocular Movements: Extraocular movements intact.     Conjunctiva/sclera: Conjunctivae normal.     Pupils: Pupils are equal, round, and reactive to light.  Cardiovascular:     Rate and Rhythm: Normal rate and regular rhythm.     Heart sounds: No murmur heard. Pulmonary:     Effort: Pulmonary effort is normal. No respiratory distress.     Breath sounds: Normal breath sounds. No wheezing or rales.  Abdominal:     Palpations: Abdomen is soft.     Tenderness: There is no abdominal  tenderness. There is no guarding.  Musculoskeletal:        General: No swelling.     Cervical back: Normal range of motion and neck supple.     Right lower leg: No edema.  Skin:    General: Skin is warm and dry.     Capillary Refill: Capillary refill takes less than 2 seconds.  Neurological:     General: No focal deficit present.     Mental Status: She is alert and oriented to person, place, and time.  Psychiatric:        Mood and Affect: Mood normal.     ED Results / Procedures / Treatments   Labs (all labs ordered are listed, but only abnormal results are displayed) Labs Reviewed  COMPREHENSIVE METABOLIC PANEL - Abnormal; Notable for the following components:      Result Value   CO2 19 (*)    Creatinine, Ser 0.43 (*)    Total Bilirubin 1.8 (*)    All other components within normal limits  CBC WITH DIFFERENTIAL/PLATELET - Abnormal; Notable for the following components:   RBC 2.50 (*)    Hemoglobin 8.1 (*)    HCT 23.0 (*)    RDW 24.5 (*)    Platelets 403 (*)    nRBC 0.4 (*)    All other components within normal limits  RETICULOCYTES  I-STAT BETA HCG BLOOD, ED ([facility], [facility], [facility] ONLY)    EKG None  Radiology No results found.  Procedures Procedures    Medications Ordered in ED Medications  HYDROmorphone (DILAUDID) injection 0.5 mg (0.5 mg Subcutaneous Not Given 07/05/22 1627)  dextrose 5 % and 0.45 % NaCl infusion ( Intravenous New Bag/Given 07/05/22 1634)  ondansetron (ZOFRAN) injection 4 mg (has no administration in time range)  HYDROmorphone (DILAUDID) injection 2 mg (has no administration in time range)  HYDROmorphone (DILAUDID) injection 2 mg (has no administration in time range)  diphenhydrAMINE (BENADRYL) 12.5 mg in sodium chloride 0.9 % 50 mL IVPB (has no administration in time range)  HYDROmorphone (DILAUDID) injection 0.5 mg (0.5 mg Subcutaneous Given 07/05/22 1555)  HYDROmorphone (DILAUDID) injection 2 mg (2 mg Intravenous Given 07/05/22 1635)    ED Course/  Medical Decision Making/ A&P                             Medical Decision Making Amount and/or Complexity of Data Reviewed Labs: ordered.  Risk Prescription drug management.   Patient's vital signs reassuring.  Patient states she normally requires admission.  Will put her through sickle cell protocol see where were at.  Did discuss with the sickle cell clinic but they do not take patients after 12 noon.  Patient's beta hCG i-STAT is negative complete metabolic panel CO2 is 19 electrolytes are normal liver function test normal  except for bili of 1.8 patient's GFR is greater than 60.  Patient CBC significant for anemia with hemoglobin of 8.1.  Platelets of 403K.  No leukocytosis.  Equi-Cyte count is pending.  Patient feeling much better after the protocol.  Sickle cell count was up some.  Her hemoglobin a little lower than usual but not requiring transfusion at 8.1.  Patient feels comfortable going home.  Will discharge her home recommend follow-up with sickle cell clinic.   Final Clinical Impression(s) / ED Diagnoses Final diagnoses:  Sickle cell pain crisis [facility])    Rx / DC Orders ED Discharge Orders     None         Vanetta Mulders, MD 07/05/22 2003

## 2022-07-05 NOTE — ED Triage Notes (Signed)
Sickle cell pain crisis.  Pt has pain in left leg which began last.  Not relieved by pain medication at home

## 2022-07-05 NOTE — ED Notes (Signed)
Attempt IV placement x1 in RAC without success 

## 2022-07-06 ENCOUNTER — Telehealth: Payer: Self-pay | Admitting: *Deleted

## 2022-07-06 NOTE — Transitions of Care (Post Inpatient/ED Visit) (Signed)
   07/06/2022  Name: Alicia Villegas MRN: [id number] DOB: 04/03/2000  Today's TOC FU Call Status: Today's TOC FU Call Status:: Unsuccessul Call (1st Attempt) Unsuccessful Call (1st Attempt) Date: 07/06/22  Attempted to reach the patient regarding the most recent Inpatient/ED visit.  Follow Up Plan: Additional outreach attempts will be made to reach the patient to complete the Transitions of Care (Post Inpatient/ED visit) call.   Estanislado Emms RN, BSN [place]  Managed [facility] RN Care Coordinator [phone]

## 2022-07-08 ENCOUNTER — Encounter (HOSPITAL_COMMUNITY): Payer: Self-pay

## 2022-07-08 ENCOUNTER — Inpatient Hospital Stay (HOSPITAL_COMMUNITY)
Admission: EM | Admit: 2022-07-08 | Discharge: 2022-07-10 | DRG: 812 | Disposition: A | Discharge status: Home / Self Care | Payer: Medicaid Other | Attending: Internal Medicine | Admitting: Internal Medicine

## 2022-07-08 DIAGNOSIS — Z79899 Other long term (current) drug therapy: Secondary | ICD-10-CM

## 2022-07-08 DIAGNOSIS — I10 Essential (primary) hypertension: Secondary | ICD-10-CM | POA: Diagnosis present

## 2022-07-08 DIAGNOSIS — E876 Hypokalemia: Secondary | ICD-10-CM | POA: Diagnosis present

## 2022-07-08 DIAGNOSIS — R6252 Short stature (child): Secondary | ICD-10-CM

## 2022-07-08 DIAGNOSIS — F4320 Adjustment disorder, unspecified: Secondary | ICD-10-CM | POA: Diagnosis not present

## 2022-07-08 DIAGNOSIS — Z9049 Acquired absence of other specified parts of digestive tract: Secondary | ICD-10-CM

## 2022-07-08 DIAGNOSIS — D638 Anemia in other chronic diseases classified elsewhere: Secondary | ICD-10-CM | POA: Diagnosis present

## 2022-07-08 DIAGNOSIS — D5700 Hb-SS disease with crisis, unspecified: Principal | ICD-10-CM | POA: Diagnosis present

## 2022-07-08 DIAGNOSIS — Z9081 Acquired absence of spleen: Secondary | ICD-10-CM | POA: Diagnosis not present

## 2022-07-08 DIAGNOSIS — F4329 Adjustment disorder with other symptoms: Secondary | ICD-10-CM | POA: Diagnosis present

## 2022-07-08 LAB — BASIC METABOLIC PANEL
Anion gap: 11 (ref 5–15)
BUN: 9 mg/dL (ref 6–20)
CO2: 20 mmol/L — ABNORMAL LOW (ref 22–32)
Calcium: 9.1 mg/dL (ref 8.9–10.3)
Chloride: 107 mmol/L (ref 98–111)
Creatinine, Ser: 0.43 mg/dL — ABNORMAL LOW (ref 0.44–1.00)
GFR, Estimated: >60 mL/min (ref >60)
Glucose, Bld: 90 mg/dL (ref 70–99)
Potassium: 3.1 mmol/L — ABNORMAL LOW (ref 3.5–5.1)
Sodium: 138 mmol/L (ref 135–145)

## 2022-07-08 LAB — CBC WITH DIFFERENTIAL/PLATELET
Abs Immature Granulocytes: 0.05 10*3/uL (ref 0.00–0.07)
Basophils Absolute: 0 10*3/uL (ref 0.0–0.1)
Basophils Relative: 0 %
Eosinophils Absolute: 0.1 10*3/uL (ref 0.0–0.5)
Eosinophils Relative: 1 %
HCT: 20.9 % — ABNORMAL LOW (ref 36.0–46.0)
Hemoglobin: 7.5 g/dL — ABNORMAL LOW (ref 12.0–15.0)
Immature Granulocytes: 0 %
Lymphocytes Relative: 27 %
Lymphs Abs: 3.2 10*3/uL (ref 0.7–4.0)
MCH: 33.2 pg (ref 26.0–34.0)
MCHC: 35.9 g/dL (ref 30.0–36.0)
MCV: 92.5 fL (ref 80.0–100.0)
Monocytes Absolute: 1.3 10*3/uL — ABNORMAL HIGH (ref 0.1–1.0)
Monocytes Relative: 11 %
Neutro Abs: 7.2 10*3/uL (ref 1.7–7.7)
Neutrophils Relative %: 61 %
Platelets: 413 10*3/uL — ABNORMAL HIGH (ref 150–400)
RBC: 2.26 MIL/uL — ABNORMAL LOW (ref 3.87–5.11)
RDW: 22.8 % — ABNORMAL HIGH (ref 11.5–15.5)
WBC: 11.9 10*3/uL — ABNORMAL HIGH (ref 4.0–10.5)
nRBC: 0.8 % — ABNORMAL HIGH (ref 0.0–0.2)

## 2022-07-08 LAB — RETICULOCYTES
Immature Retic Fract: 30.7 % — ABNORMAL HIGH (ref 2.3–15.9)
RBC.: 2.26 MIL/uL — ABNORMAL LOW (ref 3.87–5.11)
Retic Count, Absolute: 67 10*3/uL (ref 19.0–186.0)
Retic Ct Pct: 14.3 % — ABNORMAL HIGH (ref 0.4–3.1)

## 2022-07-08 LAB — I-STAT BETA HCG BLOOD, ED (MC, WL, AP ONLY): I-stat hCG, quantitative: <5 m[IU]/mL (ref <5)

## 2022-07-08 MED ORDER — DIPHENHYDRAMINE HCL 12.5 MG/5ML PO ELIX: 12.5000 mg | ORAL_SOLUTION | Freq: Four times a day (QID) | ORAL | Status: DC | PRN

## 2022-07-08 MED ORDER — HYDROXYUREA 100 MG/ML ORAL SUSPENSION: 1200.0000 mg | Freq: Every day | ORAL | Status: DC

## 2022-07-08 MED ORDER — HYDROMORPHONE HCL 2 MG/ML IJ SOLN
2.0000 mg | INTRAMUSCULAR | Status: AC
  Administered 2022-07-08: 2 mg via INTRAVENOUS
  Filled 2022-07-08: qty 1

## 2022-07-08 MED ORDER — SODIUM CHLORIDE 0.9 % IV SOLN
12.5000 mg | Freq: Once | INTRAVENOUS | Status: AC
  Administered 2022-07-08: 12.5 mg via INTRAVENOUS
  Filled 2022-07-08: qty 12.5

## 2022-07-08 MED ORDER — ONDANSETRON HCL 4 MG/2ML IJ SOLN: 4.0000 mg | INTRAMUSCULAR | Status: DC | PRN

## 2022-07-08 MED ORDER — SENNOSIDES-DOCUSATE SODIUM 8.6-50 MG PO TABS
1.0000 | ORAL_TABLET | Freq: Two times a day (BID) | ORAL | Status: DC
  Administered 2022-07-09 (×3): 1 via ORAL
  Filled 2022-07-08 (×4): qty 1

## 2022-07-08 MED ORDER — PROPRANOLOL HCL 20 MG PO TABS: 20.0000 mg | ORAL_TABLET | Freq: Every day | ORAL | Status: DC | PRN

## 2022-07-08 MED ORDER — ONDANSETRON HCL 4 MG/2ML IJ SOLN: 4.0000 mg | Freq: Four times a day (QID) | INTRAMUSCULAR | Status: DC | PRN

## 2022-07-08 MED ORDER — DEXTROSE-SODIUM CHLORIDE 5-0.45 % IV SOLN: INTRAVENOUS | Status: DC

## 2022-07-08 MED ORDER — NALOXONE HCL 0.4 MG/ML IJ SOLN: 0.4000 mg | INTRAMUSCULAR | Status: DC | PRN

## 2022-07-08 MED ORDER — SODIUM CHLORIDE 0.45 % IV SOLN: INTRAVENOUS | Status: DC

## 2022-07-08 MED ORDER — HYDROMORPHONE 1 MG/ML IV SOLN
INTRAVENOUS | Status: DC
  Administered 2022-07-09: 30 mg via INTRAVENOUS
  Administered 2022-07-09: 0.1 mg via INTRAVENOUS
  Administered 2022-07-09: 0.8 mg via INTRAVENOUS
  Administered 2022-07-09: 0.3 mg via INTRAVENOUS
  Filled 2022-07-08: qty 30

## 2022-07-08 MED ORDER — KETOROLAC TROMETHAMINE 15 MG/ML IJ SOLN
15.0000 mg | Freq: Four times a day (QID) | INTRAMUSCULAR | Status: DC
  Administered 2022-07-09 – 2022-07-10 (×7): 15 mg via INTRAVENOUS
  Filled 2022-07-08 (×7): qty 1

## 2022-07-08 MED ORDER — ENOXAPARIN SODIUM 40 MG/0.4ML IJ SOSY
40.0000 mg | PREFILLED_SYRINGE | INTRAMUSCULAR | Status: DC
  Filled 2022-07-08 (×2): qty 0.4

## 2022-07-08 MED ORDER — POLYETHYLENE GLYCOL 3350 17 G PO PACK: 17.0000 g | PACK | Freq: Every day | ORAL | Status: DC | PRN

## 2022-07-08 MED ORDER — DIPHENHYDRAMINE HCL 50 MG/ML IJ SOLN
12.5000 mg | Freq: Four times a day (QID) | INTRAMUSCULAR | Status: DC | PRN
  Administered 2022-07-09: 12.5 mg via INTRAVENOUS
  Filled 2022-07-08: qty 1

## 2022-07-08 MED ORDER — SODIUM CHLORIDE 0.9% FLUSH: 9.0000 mL | INTRAVENOUS | Status: DC | PRN

## 2022-07-08 MED ORDER — PANTOPRAZOLE SODIUM 40 MG PO TBEC
40.0000 mg | DELAYED_RELEASE_TABLET | Freq: Every day | ORAL | Status: DC
  Administered 2022-07-09 – 2022-07-10 (×2): 40 mg via ORAL
  Filled 2022-07-08 (×2): qty 1

## 2022-07-08 MED ORDER — VOXELOTOR 500 MG PO TABS: 1500.0000 mg | ORAL_TABLET | Freq: Every day | ORAL | Status: DC

## 2022-07-08 NOTE — ED Provider Notes (Signed)
Ultrasound ED Peripheral IV (Provider)  Date/Time: 07/08/2022 5:54 PM  Performed by: Gailen Shelter, PA Authorized by: Gailen Shelter, PA   Procedure details:    Indications: multiple failed IV attempts     Skin Prep: chlorhexidine gluconate     Location:  Left AC   Angiocath:  20 G   Bedside Ultrasound Guided: Yes     Images: not archived     Patient tolerated procedure without complications: Yes     Dressing applied: Yes       Gailen Shelter, PA 07/08/22 1754    Terald Sleeper, MD 07/08/22 2245

## 2022-07-08 NOTE — ED Provider Notes (Signed)
[place] EMERGENCY DEPARTMENT AT [facility] Provider Note   CSN: [id number] Arrival date & time: 07/08/22  1434     History  Chief Complaint  Patient presents with   Sickle Cell Pain Crisis    Alicia Villegas is a 22 y.o. female.  The history is provided by the patient and medical records. No language interpreter was used.  Sickle Cell Pain Crisis Location:  Lower extremity Severity:  Severe Onset quality:  Gradual Duration:  2 days Similar to previous crisis episodes: yes   Timing:  Constant Progression:  Waxing and waning Chronicity:  Recurrent Context: not infection   Relieved by:  Nothing Worsened by:  Nothing Ineffective treatments: home meds. Associated symptoms: no chest pain, no congestion, no cough, no fatigue, no fever, no headaches, no nausea, no shortness of breath and no vomiting        Home Medications Prior to Admission medications   Medication Sig Start Date End Date Taking? Authorizing Provider  acetaminophen (TYLENOL) 500 MG tablet Take 500 mg by mouth every 6 (six) hours as needed for mild pain.    [provider]  hydroxyurea (HYDREA) 100 mg/mL SUSP Take 1,200 mg by mouth at bedtime.    [provider]  ibuprofen (ADVIL) 200 MG tablet Take 200-800 mg by mouth every 6 (six) hours as needed for mild pain or headache.    [provider]  omeprazole (PRILOSEC) 20 MG capsule Take 1 capsule (20 mg total) by mouth daily. 12/21/21   Ivonne Andrew, NP  ondansetron (ZOFRAN) 4 MG tablet Take 1 tablet (4 mg total) by mouth every 8 (eight) hours as needed for nausea or vomiting. 12/21/21   Ivonne Andrew, NP  prochlorperazine (COMPAZINE) 10 MG tablet Take 10 mg by mouth every 6 (six) hours as needed for nausea or vomiting ("for headaches").    [provider]  propranolol (INDERAL) 20 MG tablet Take 1 tablet (20 mg total) by mouth daily. 04/28/21   Massie Maroon, FNP  Vitamin D, Ergocalciferol, (DRISDOL) 1.25  MG (50000 UNIT) CAPS capsule Take 1 capsule (50,000 Units total) by mouth every 7 (seven) days. 11/09/21   Ivonne Andrew, NP  voxelotor (OXBRYTA) 500 MG TABS tablet Take 1,500 mg by mouth at bedtime. 10/19/20   [provider]  Water For Irrigation, Sterile (STERILE WATER FOR IRRIGATION)  02/27/22   [provider]      Allergies    Patient has no known allergies.    Review of Systems   Review of Systems  Constitutional:  Negative for chills, fatigue and fever.  HENT:  Negative for congestion.   Respiratory:  Negative for cough, chest tightness and shortness of breath.   Cardiovascular:  Negative for chest pain, palpitations and leg swelling.  Gastrointestinal:  Negative for abdominal pain, constipation, diarrhea, nausea and vomiting.  Genitourinary:  Negative for dysuria.  Musculoskeletal:  Negative for back pain, neck pain and neck stiffness.  Skin:  Negative for rash and wound.  Neurological:  Negative for light-headedness and headaches.  Psychiatric/Behavioral:  Negative for agitation and confusion.   All other systems reviewed and are negative.   Physical Exam Updated Vital Signs BP 112/74 (BP Location: Left Arm)   Pulse (!) 107   Temp 98.8 F (37.1 C) (Oral)   Resp 16   LMP 07/04/2022 Comment: negative HCG quantitaitve 06/05/22  SpO2 96%  Physical Exam Vitals and nursing note reviewed.  Constitutional:      General: She  is not in acute distress.    Appearance: She is well-developed. She is not ill-appearing, toxic-appearing or diaphoretic.  HENT:     Head: Normocephalic and atraumatic.     Mouth/Throat:     Mouth: Mucous membranes are moist.     Pharynx: No oropharyngeal exudate or posterior oropharyngeal erythema.  Eyes:     Extraocular Movements: Extraocular movements intact.     Conjunctiva/sclera: Conjunctivae normal.     Pupils: Pupils are equal, round, and reactive to light.  Cardiovascular:     Rate and Rhythm: Normal rate and regular  rhythm.     Heart sounds: No murmur heard. Pulmonary:     Effort: Pulmonary effort is normal. No respiratory distress.     Breath sounds: Normal breath sounds. No wheezing, rhonchi or rales.  Chest:     Chest wall: No tenderness.  Abdominal:     General: Abdomen is flat.     Palpations: Abdomen is soft.     Tenderness: There is no abdominal tenderness. There is no right CVA tenderness, left CVA tenderness, guarding or rebound.  Musculoskeletal:        General: Tenderness present. No swelling.     Cervical back: Neck supple.     Right lower leg: No edema.     Left lower leg: No edema.  Skin:    General: Skin is warm and dry.     Capillary Refill: Capillary refill takes less than 2 seconds.     Findings: No erythema or rash.  Neurological:     General: No focal deficit present.     Mental Status: She is alert.  Psychiatric:        Mood and Affect: Mood normal.     ED Results / Procedures / Treatments   Labs (all labs ordered are listed, but only abnormal results are displayed) Labs Reviewed  RETICULOCYTES - Abnormal; Notable for the following components:      Result Value   Retic Ct Pct 14.3 (*)    RBC. 2.26 (*)    Immature Retic Fract 30.7 (*)    All other components within normal limits  BASIC METABOLIC PANEL - Abnormal; Notable for the following components:   Potassium 3.1 (*)    CO2 20 (*)    Creatinine, Ser 0.43 (*)    All other components within normal limits  CBC WITH DIFFERENTIAL/PLATELET - Abnormal; Notable for the following components:   WBC 11.9 (*)    RBC 2.26 (*)    Hemoglobin 7.5 (*)    HCT 20.9 (*)    RDW 22.8 (*)    Platelets 413 (*)    nRBC 0.8 (*)    Monocytes Absolute 1.3 (*)    All other components within normal limits  I-STAT BETA HCG BLOOD, ED ([facility], [facility], [facility] ONLY)    EKG None  Radiology No results found.  Procedures Procedures    Medications Ordered in ED Medications  ondansetron (ZOFRAN) injection 4 mg (has no administration in  time range)  0.45 % sodium chloride infusion ( Intravenous New Bag/Given 07/08/22 1758)  HYDROmorphone (DILAUDID) injection 2 mg (2 mg Intravenous Given 07/08/22 1759)  HYDROmorphone (DILAUDID) injection 2 mg (2 mg Intravenous Given 07/08/22 1837)  HYDROmorphone (DILAUDID) injection 2 mg (2 mg Intravenous Given 07/08/22 1904)  diphenhydrAMINE (BENADRYL) 12.5 mg in sodium chloride 0.9 % 50 mL IVPB (0 mg Intravenous Stopped 07/08/22 1838)    ED Course/ Medical Decision Making/ A&P  Medical Decision Making Amount and/or Complexity of Data Reviewed Labs: ordered.  Risk Prescription drug management. Decision regarding hospitalization.    Alicia Villegas is a 22 y.o. female with a past medical history significant for sickle cell disease, previous cholecystectomy, previous acute chest syndrome, and migraines who presents with left leg pain and pain crisis.  According to patient, she feels that she is having left leg pain consistent with sickle cell pain crisis.  She was here several days ago and was able to get things turned around with medications.  She otherwise denies fevers, chills, cough.  Denies any leg swelling.  Denies any rashes.  Denies history of blood clots.  Denies any trauma.  Denies any constipation, diarrhea, or urinary changes, she is simply complaining of sickle cell pain that is severe and not responding to home medications.  On exam, lungs clear.  Chest nontender.  Patient does have tenderness in her left upper leg in the thigh but is not focally tender in the knee or ankle.  Intact sensation strength and pulses.  Patient otherwise well-appearing.  Had a shared decision-making conversation, given lack of trauma, will hold on imaging and her symptoms and exam do not seem consistent with DVT at this time.  Will get screening labs and give her sickle cell pain medications per the order set.  If pain has improved and labs are similar to prior, anticipate discharge  home for outpatient sickle cell follow-up.  Anticipate reassessment.  10:00 PM Patient was reassessed and she is still having significant pain.  She is tearful and says she does not feel she can control her pain at home.  She think she needs to admitted.  Will call for admission for pain control.  Labs grossly similar to what they have been in the past with some anemia and leukocytosis.  Will call for admission.          Final Clinical Impression(s) / ED Diagnoses Final diagnoses:  Sickle cell pain crisis ([name])    Clinical Impression: 1. Sickle cell pain crisis ([name])     Disposition: Admit  This note was prepared with assistance of Dragon voice recognition software. Occasional wrong-word or sound-a-like substitutions may have occurred due to the inherent limitations of voice recognition software.     Alicia Villegas, Canary Brim, MD 07/08/22 2228

## 2022-07-08 NOTE — H&P (Signed)
History and Physical    Patient: Alicia Villegas ZOX:[id number] DOB: 09/27/2000 DOA: 07/08/2022 DOS: the patient was seen and examined on 07/08/2022 PCP: Massie Maroon, FNP  Patient coming from: Home  Chief Complaint:  Chief Complaint  Patient presents with   Sickle Cell Pain Crisis   HPI: Alicia Villegas is a 22 y.o. female with medical history significant of sickle cell disease, short stature, sickle cell anemia, status post multiple surgeries including cholecystectomy who presents to the ER with pain in her arms and legs.  Pain is persistent.  Rated as 10 out of 10.  Patient has taken her home regimen.  She still has no relief from the pain.  She is opiate nave.  In the ER she got up to 6 mg of Dilaudid IV but the pain is still at 9 out of 10.  No nausea vomiting or diarrhea.  Patient is being admitted with sickle cell pain crisis.  She was on ibuprofen at home that she apparently ran out of yesterday.  Review of Systems: As mentioned in the history of present illness. All other systems reviewed and are negative. 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 She has never used smokeless tobacco. She reports that she does not drink alcohol and does not use drugs.  No Known Allergies  Family History  Family history unknown: Yes    Prior to Admission medications   Medication Sig Start Date End Date Taking? Authorizing Provider  acetaminophen (TYLENOL) 500 MG tablet Take 500 mg by mouth every 6 (six) hours as needed for mild pain.   Yes [provider]   hydroxyurea (HYDREA) 100 mg/mL SUSP Take 1,200 mg by mouth at bedtime.   Yes [provider]  ibuprofen (ADVIL) 200 MG tablet Take 200-800 mg by mouth every 6 (six) hours as needed for mild pain or headache.   Yes [provider]  omeprazole (PRILOSEC) 20 MG capsule Take 1 capsule (20 mg total) by mouth daily. Patient taking differently: Take 20 mg by mouth daily as needed (heartburn). 12/21/21  Yes Ivonne Andrew, NP  prochlorperazine (COMPAZINE) 10 MG tablet Take 10 mg by mouth every 6 (six) hours as needed for nausea or vomiting ("for headaches").   Yes [provider]  propranolol (INDERAL) 20 MG tablet Take 1 tablet (20 mg total) by mouth daily. Patient taking differently: Take 20 mg by mouth daily as needed (stomach pain). 04/28/21  Yes Massie Maroon, FNP  Vitamin D, Ergocalciferol, (DRISDOL) 1.25 MG (50000 UNIT) CAPS capsule Take 1 capsule (50,000 Units total) by mouth every 7 (seven) days. 11/09/21  Yes Ivonne Andrew, NP  voxelotor (OXBRYTA) 500 MG TABS tablet Take 1,500 mg by mouth at bedtime. 10/19/20  Yes [provider]  ondansetron (ZOFRAN) 4 MG tablet Take 1 tablet (4 mg total) by mouth every 8 (eight) hours as needed for nausea or vomiting. Patient not taking: Reported on 07/08/2022 12/21/21   Angus Seller  S, NP  Water For Irrigation, Sterile (STERILE WATER FOR IRRIGATION)  02/27/22   [provider]    Physical Exam: Vitals:   07/08/22 2130 07/08/22 2145 07/08/22 2200 07/08/22 2215  BP: 132/62 (!) 128/58 130/60 (!) 141/60  Pulse: 99 92 88 80  Resp:      Temp:      TempSrc:      SpO2: 98% 100% 99% 100%  Weight:      Height:       Constitutional: NAD, calm, comfortable Eyes: PERRL, lids and conjunctivae normal ENMT: Mucous membranes are moist. Posterior pharynx clear of any exudate or lesions.Normal dentition.  Neck: normal, supple, no masses, no thyromegaly Respiratory: clear to auscultation bilaterally, no wheezing, no  crackles. Normal respiratory effort. No accessory muscle use.  Cardiovascular: Regular rate and rhythm, no murmurs / rubs / gallops. No extremity edema. 2+ pedal pulses. No carotid bruits.  Abdomen: no tenderness, no masses palpated. No hepatosplenomegaly. Bowel sounds positive.  Musculoskeletal: Good range of motion, no joint swelling or tenderness, Skin: no rashes, lesions, ulcers. No induration Neurologic: CN 2-12 grossly intact. Sensation intact, DTR normal. Strength 5/5 in all 4.  Psychiatric: Normal judgment and insight. Alert and oriented x 3. Normal mood  Data Reviewed:  Sodium 138, potassium 3.1, hemoglobin is 7.6 with a white count of 11.9.  Platelets 413.  Patient's reticulocyte percent is 14.3.  Assessment and Plan:  #1 sickle cell pain crisis: Patient will be admitted.  Initiate full dose Dilaudid PCA.  Patient is opiate nave.  Also Toradol, D5 half-normal at 100 cc an hour.  Continue to assess pain.  Transition to oral therapy.  #2 anemia of chronic disease: Hemoglobin is stable at 7.5.  No need for transfusion at this point.  Continue monitoring.  #3 essential hypertension: Blood pressure is stable at this point.  Continue home regimen  #4 hypokalemia: Replete potassium.    Advance Care Planning:   Code Status: Full Code   Consults: None  Family Communication: No family at bedside  Severity of Illness: The appropriate patient status for this patient is INPATIENT. Inpatient status is judged to be reasonable and necessary in order to provide the required intensity of service to ensure the patient's safety. The patient's presenting symptoms, physical exam findings, and initial radiographic and laboratory data in the context of their chronic comorbidities is felt to place them at high risk for further clinical deterioration. Furthermore, it is not anticipated that the patient will be medically stable for discharge from the hospital within 2 midnights of admission.   * I  certify that at the point of admission it is my clinical judgment that the patient will require inpatient hospital care spanning beyond 2 midnights from the point of admission due to high intensity of service, high risk for further deterioration and high frequency of surveillance required.*  AuthorLonia Blood, MD 07/08/2022 10:42 PM  For on call review [email or web address].

## 2022-07-08 NOTE — ED Notes (Signed)
Patient placed on 2L O2 as she her SpO2 was dropping after the third dose of Dilaudid

## 2022-07-08 NOTE — ED Triage Notes (Signed)
Pt arrived via POV, c/o SCC pain in left leg. Has not taken any pain medication in the last few hours.

## 2022-07-08 NOTE — Progress Notes (Signed)
Hydroxyurea (Hydrea) hold criteria in sickle cell disease: ANC < 2K Pltc < 80K  Hgb <= 6 g/dL Reticulocytes < 78G when Hgb < 9 g/dL   10/11/6211: Hg 7.5, reticulocytes 67.   Hold hydroxyurea per above criteria.   F/U CBC Defer to sickle cell team to resume medication when clinically appropriate.   Junita Push, PharmD, BCPS 07/08/2022@11 :03 PM

## 2022-07-09 ENCOUNTER — Other Ambulatory Visit: Payer: Self-pay

## 2022-07-09 LAB — COMPREHENSIVE METABOLIC PANEL
ALT: 12 U/L (ref 0–44)
AST: 23 U/L (ref 15–41)
Albumin: 4 g/dL (ref 3.5–5.0)
Alkaline Phosphatase: 74 U/L (ref 38–126)
Anion gap: 6 (ref 5–15)
BUN: 8 mg/dL (ref 6–20)
CO2: 19 mmol/L — ABNORMAL LOW (ref 22–32)
Calcium: 8.6 mg/dL — ABNORMAL LOW (ref 8.9–10.3)
Chloride: 107 mmol/L (ref 98–111)
Creatinine, Ser: 0.35 mg/dL — ABNORMAL LOW (ref 0.44–1.00)
GFR, Estimated: >60 mL/min (ref >60)
Glucose, Bld: 98 mg/dL (ref 70–99)
Potassium: 3.2 mmol/L — ABNORMAL LOW (ref 3.5–5.1)
Sodium: 132 mmol/L — ABNORMAL LOW (ref 135–145)
Total Bilirubin: 1.9 mg/dL — ABNORMAL HIGH (ref 0.3–1.2)
Total Protein: 7 g/dL (ref 6.5–8.1)

## 2022-07-09 LAB — PREPARE RBC (CROSSMATCH)

## 2022-07-09 LAB — CBC WITH DIFFERENTIAL/PLATELET
Abs Immature Granulocytes: 0.06 10*3/uL (ref 0.00–0.07)
Basophils Absolute: 0.1 10*3/uL (ref 0.0–0.1)
Basophils Relative: 1 %
Eosinophils Absolute: 0.2 10*3/uL (ref 0.0–0.5)
Eosinophils Relative: 1 %
HCT: 19.5 % — ABNORMAL LOW (ref 36.0–46.0)
Hemoglobin: 6.7 g/dL — CL (ref 12.0–15.0)
Immature Granulocytes: 1 %
Lymphocytes Relative: 28 %
Lymphs Abs: 3.3 10*3/uL (ref 0.7–4.0)
MCH: 32.7 pg (ref 26.0–34.0)
MCHC: 34.4 g/dL (ref 30.0–36.0)
MCV: 95.1 fL (ref 80.0–100.0)
Monocytes Absolute: 1.4 10*3/uL — ABNORMAL HIGH (ref 0.1–1.0)
Monocytes Relative: 12 %
Neutro Abs: 6.9 10*3/uL (ref 1.7–7.7)
Neutrophils Relative %: 57 %
Platelets: 386 10*3/uL (ref 150–400)
RBC: 2.05 MIL/uL — ABNORMAL LOW (ref 3.87–5.11)
RDW: 23.1 % — ABNORMAL HIGH (ref 11.5–15.5)
WBC: 11.9 10*3/uL — ABNORMAL HIGH (ref 4.0–10.5)
nRBC: 0.7 % — ABNORMAL HIGH (ref 0.0–0.2)

## 2022-07-09 LAB — BPAM RBC: Unit Type and Rh: 600

## 2022-07-09 LAB — TYPE AND SCREEN
ABO/RH(D): AB POS
Unit division: 0

## 2022-07-09 MED ORDER — VOXELOTOR 500 MG PO TABS: 1500.0000 mg | ORAL_TABLET | Freq: Every day | ORAL | Status: DC

## 2022-07-09 MED ORDER — OXYCODONE HCL 5 MG PO TABS: 5.0000 mg | ORAL_TABLET | ORAL | Status: DC | PRN

## 2022-07-09 MED ORDER — SODIUM CHLORIDE 0.9% IV SOLUTION: Freq: Once | INTRAVENOUS | Status: DC

## 2022-07-09 MED ORDER — SODIUM CHLORIDE 0.9 % IV SOLN: 12.5000 mg | Freq: Four times a day (QID) | INTRAVENOUS | Status: DC | PRN

## 2022-07-09 MED ORDER — DIPHENHYDRAMINE HCL 12.5 MG/5ML PO ELIX: 12.5000 mg | ORAL_SOLUTION | Freq: Four times a day (QID) | ORAL | Status: DC | PRN

## 2022-07-09 NOTE — Progress Notes (Signed)
Subjective: Alicia Villegas is a 22 year old female with a medical history significant for sickle cell disease that was admitted and sickle cell pain crisis. Patient states that her pain intensity has decreased on today.  She rates pain as 3/10.  Today, patient's hemoglobin is decreased at 6.7 g/dL. Patient denies any shortness of breath, headache, urinary symptoms, nausea, vomiting, or diarrhea.  Objective:  Vital signs in last 24 hours:  Vitals:   07/09/22 1751 07/09/22 2107 07/09/22 2236 07/09/22 2300  BP: (!) 100/59 (!) 100/54 (!) 102/59 107/65  Pulse: 95 90 80 87  Resp: 15 18 18 20   Temp: 98.4 F (36.9 C) 98.7 F (37.1 C) 98 F (36.7 C) 98.1 F (36.7 C)  TempSrc: Oral Oral Oral Oral  SpO2: 92% 96% 96% 95%  Weight:      Height:        Intake/Output from previous day:   Intake/Output Summary (Last 24 hours) at 07/10/2022 0016 Last data filed at 07/09/2022 1800 Gross per 24 hour  Intake 2724.41 ml  Output --  Net 2724.41 ml    Physical Exam: General: Alert, awake, oriented x3, in no acute distress.  HEENT: [place]/AT PEERL, EOMI Neck: Trachea midline,  no masses, no thyromegal,y no JVD, no carotid bruit OROPHARYNX:  Moist, No exudate/ erythema/lesions.  Heart: Regular rate and rhythm, without murmurs, rubs, gallops, PMI non-displaced, no heaves or thrills on palpation.  Lungs: Clear to auscultation, no wheezing or rhonchi noted. No increased vocal fremitus resonant to percussion  Abdomen: Soft, nontender, nondistended, positive bowel sounds, no masses no hepatosplenomegaly noted..  Neuro: No focal neurological deficits noted cranial nerves II through XII grossly intact. DTRs 2+ bilaterally upper and lower extremities. Strength 5 out of 5 in bilateral upper and lower extremities. Musculoskeletal: No warm swelling or erythema around joints, no spinal tenderness noted. Psychiatric: Patient alert and oriented x3, good insight and cognition, good recent to remote recall. Lymph node  survey: No cervical axillary or inguinal lymphadenopathy noted.  Lab Results:  Basic Metabolic Panel:    Component Value Date/Time   NA 132 (L) 07/09/2022 0539   NA 144 05/01/2022 1051   K 3.2 (L) 07/09/2022 0539   CL 107 07/09/2022 0539   CO2 19 (L) 07/09/2022 0539   BUN 8 07/09/2022 0539   BUN 6 05/01/2022 1051   CREATININE 0.35 (L) 07/09/2022 0539   CREATININE 0.30 (L) 05/02/2015 1442   GLUCOSE 98 07/09/2022 0539   CALCIUM 8.6 (L) 07/09/2022 0539   CBC:    Component Value Date/Time   WBC 11.9 (H) 07/09/2022 0539   HGB 6.7 (LL) 07/09/2022 0539   HGB 7.6 (L) 05/01/2022 1051   HCT 19.5 (L) 07/09/2022 0539   HCT 22.4 (L) 05/01/2022 1051   PLT 386 07/09/2022 0539   PLT 449 05/01/2022 1051   MCV 95.1 07/09/2022 0539   MCV 93 05/01/2022 1051   NEUTROABS 6.9 07/09/2022 0539   NEUTROABS 3.2 05/01/2022 1051   LYMPHSABS 3.3 07/09/2022 0539   LYMPHSABS 4.2 (H) 05/01/2022 1051   MONOABS 1.4 (H) 07/09/2022 0539   EOSABS 0.2 07/09/2022 0539   EOSABS 0.1 05/01/2022 1051   BASOSABS 0.1 07/09/2022 0539   BASOSABS 0.1 05/01/2022 1051    No results found for this or any previous visit (from the past 240 hour(s)).  Studies/Results: No results found.  Medications: Scheduled Meds:  sodium chloride   Intravenous Once   enoxaparin (LOVENOX) injection  40 mg Subcutaneous Q24H   ketorolac  15 mg Intravenous Q6H  pantoprazole  40 mg Oral Daily   senna-docusate  1 tablet Oral BID   voxelotor  1,500 mg Oral QHS   Continuous Infusions:  sodium chloride Stopped (07/09/22 0020)   dextrose 5 % and 0.45 % NaCl 100 mL/hr at 07/09/22 1830   diphenhydrAMINE     PRN Meds:.diphenhydrAMINE **OR** diphenhydrAMINE, ondansetron, oxyCODONE, polyethylene glycol, propranolol  Consultants: none  Procedures: none  Antibiotics: none  Assessment/Plan: Active Problems:   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 Monitor vital signs very closely, reevaluate pain scale regularly, and supplemental oxygen as needed.  Anemia of chronic disease: Hemoglobin 6.7 g/dL.  Transfuse 1 unit PRBCs.  Labs in AM.  Hypokalemia: Replete.  Follow labs in AM.    Code Status: Full Code Family Communication: N/A Disposition Plan: Not yet ready for discharge  Nolon Nations  APRN, MSN, FNP-C [facility] [address] [place], [place] [address] [phone]  If 7PM-7AM, please contact night-coverage.  07/10/2022, 12:16 AM  LOS: 2 days

## 2022-07-10 DIAGNOSIS — D5700 Hb-SS disease with crisis, unspecified: Secondary | ICD-10-CM | POA: Diagnosis not present

## 2022-07-10 LAB — CBC
HCT: 22.8 % — ABNORMAL LOW (ref 36.0–46.0)
Hemoglobin: 8.2 g/dL — ABNORMAL LOW (ref 12.0–15.0)
MCH: 31.9 pg (ref 26.0–34.0)
MCHC: 36 g/dL (ref 30.0–36.0)
MCV: 88.7 fL (ref 80.0–100.0)
Platelets: 392 10*3/uL (ref 150–400)
RBC: 2.57 MIL/uL — ABNORMAL LOW (ref 3.87–5.11)
RDW: 20.3 % — ABNORMAL HIGH (ref 11.5–15.5)
WBC: 8.6 10*3/uL (ref 4.0–10.5)
nRBC: 1.2 % — ABNORMAL HIGH (ref 0.0–0.2)

## 2022-07-10 LAB — TYPE AND SCREEN
Antibody Screen: POSITIVE
Unit division: 0

## 2022-07-10 LAB — BPAM RBC: Blood Product Expiration Date: 202406252359

## 2022-07-10 NOTE — Progress Notes (Signed)
Explained discharge instructions and medication to patient . She verbalized understanding.

## 2022-07-10 NOTE — Discharge Summary (Signed)
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   Specialty: Family Medicine Contact information: [address] [place] [address] [phone]                Pt is discharged home in good condition and is to follow up with Massie Maroon, FNP this week to have labs evaluated. [name] is instructed to increase activity slowly and balance with rest for the next few days, and use prescribed medication to complete treatment of pain  Diet: Regular Wt Readings from Last 3 Encounters:  07/08/22 49.9 kg  04/24/22 49.9 kg  04/17/22 49.6 kg    History of present illness:  Alicia Villegas is a 22 year old female with a medical history significant for sickle cell disease, sickle cell anemia, status post multiple surgeries including cholecystectomy who presents to the ER with pain in her arms and legs.  Pain is persistent.  Rates pain as 10/10.  Patient has taken her home medication regimen with no relief from pain.  She is opiate nave.  In the ER, she got up to 6 mg of Dilaudid IV, but the pain is still at a rate of 9/10.  No nausea, vomiting, or diarrhea.  Patient is being admitted with sickle cell pain crisis.  She states that she was on ibuprofen at home and she apparently ran out of it yesterday.  Hospital Course:  Sickle cell disease with pain crisis: Patient was admitted for sickle cell pain crisis and managed appropriately with IVF, full dose IV Dilaudid via PCA and IV Toradol, as well as other adjunct therapies per sickle cell pain  management protocols.  Patient is not having any pain prior to discharge.  She will resume her home medication regimen.  Patient is mostly opiate nave and primarily uses ibuprofen for pain control. During admission, patient's hemoglobin decreased to 6.7 g/dL.  She was transfused 1 unit PRBCs.  Prior to discharge, patient's hemoglobin was 8.2 g/dL. She will follow-up with her PCP for CBC with differential and CMP in 1 week.  Patient expressed understanding. She is alert, oriented, and ambulating without assistance. Patient was therefore discharged home today in a hemodynamically stable condition.   Shontrell will follow-up with PCP within 1 week of this discharge. Keaja was counseled extensively about nonpharmacologic means of pain management, patient verbalized understanding and was appreciative of  the care received during this admission.   We discussed the need for good hydration, monitoring of hydration status, avoidance of heat, cold, stress, and infection triggers. We discussed the need to be adherent with taking Hydrea and other home medications. Patient was reminded of the need to seek medical attention immediately if any symptom of bleeding, anemia, or infection occurs.  Discharge Exam: Vitals:   07/10/22 0623 07/10/22 0932  BP: (!) 103/59 99/62  Pulse: 74 60  Resp: 18   Temp: 98.5 F (36.9 C) 98.1 F (36.7 C)  SpO2: 97%    Vitals:   07/09/22 2304 07/10/22 0109 07/10/22 0623 07/10/22 0932  BP: 107/65 108/64 (!) 103/59 99/62  Pulse:  87 72 74 60  Resp: 20 20 18    Temp: 98.1 F (36.7 C) 98 F (36.7 C) 98.5 F (36.9 C) 98.1 F (36.7 C)  TempSrc: Oral Oral Oral Oral  SpO2: 95% 96% 97%   Weight:      Height:        General appearance : Awake, alert, not in any distress. Speech Clear. Not toxic looking HEENT: Atraumatic and Normocephalic, pupils equally reactive to light and accomodation Neck: Supple, no JVD. No cervical lymphadenopathy.  Chest: Good air entry bilaterally, no  added sounds  CVS: S1 S2 regular, no murmurs.  Abdomen: Bowel sounds present, Non tender and not distended with no gaurding, rigidity or rebound. Extremities: B/L Lower Ext shows no edema, both legs are warm to touch Neurology: Awake alert, and oriented X 3, CN II-XII intact, Non focal Skin: No Rash  Discharge Instructions  Discharge Instructions     Discharge patient   Complete by: As directed    Discharge disposition: 01-Home or Self Care   Discharge patient date: 07/10/2022      Allergies as of 07/10/2022   No Known Allergies      Medication List     TAKE these medications    acetaminophen 500 MG tablet Commonly known as: TYLENOL Take 500 mg by mouth every 6 (six) hours as needed for mild pain.   hydroxyurea 100 mg/mL Susp Commonly known as: HYDREA Take 1,200 mg by mouth at bedtime.   ibuprofen 200 MG tablet Commonly known as: ADVIL Take 200-800 mg by mouth every 6 (six) hours as needed for mild pain or headache.   omeprazole 20 MG capsule Commonly known as: PRILOSEC Take 1 capsule (20 mg total) by mouth daily. What changed:  when to take this reasons to take this   ondansetron 4 MG tablet Commonly known as: Zofran Take 1 tablet (4 mg total) by mouth every 8 (eight) hours as needed for nausea or vomiting.   prochlorperazine 10 MG tablet Commonly known as: COMPAZINE Take 10 mg by mouth every 6 (six) hours as needed for nausea or vomiting ("for headaches").   propranolol 20 MG tablet Commonly known as: INDERAL Take 1 tablet (20 mg total) by mouth daily. What changed:  when to take this reasons to take this   sterile water for irrigation   Vitamin D (Ergocalciferol) 1.25 MG (50000 UNIT) Caps capsule Commonly known as: DRISDOL Take 1 capsule (50,000 Units total) by mouth every 7 (seven) days.   voxelotor 500 MG Tabs tablet Commonly known as: OXBRYTA Take 1,500 mg by mouth at bedtime.        The results of significant diagnostics from this  hospitalization (including imaging, microbiology, ancillary and laboratory) are listed below for reference.    Significant Diagnostic Studies: No results found.  Microbiology: No results found for this or any previous visit (from the past 240 hour(s)).   Labs: Basic Metabolic Panel: Recent Labs  Lab 07/05/22 1442 07/08/22 1757 07/09/22 0539  NA 138 138 132*  K 3.6 3.1* 3.2*  CL 111 107 107  CO2 19* 20* 19*  GLUCOSE 89 90 98  BUN 6 9 8   CREATININE 0.43* 0.43* 0.35*  CALCIUM 9.2 9.1 8.6*   Liver Function Tests: Recent Labs  Lab 07/05/22 1442 07/09/22 0539  AST 41 23  ALT 15 12  ALKPHOS 72 74  BILITOT 1.8* 1.9*  PROT 8.0 7.0  ALBUMIN 4.5 4.0   No results for input(s): "LIPASE", "AMYLASE" in the last 168  hours. No results for input(s): "AMMONIA" in the last 168 hours. CBC: Recent Labs  Lab 07/05/22 1442 07/08/22 1757 07/09/22 0539 07/10/22 0838  WBC 9.7 11.9* 11.9* 8.6  NEUTROABS 5.7 7.2 6.9  --   HGB 8.1* 7.5* 6.7* 8.2*  HCT 23.0* 20.9* 19.5* 22.8*  MCV 92.0 92.5 95.1 88.7  PLT 403* 413* 386 392   Cardiac Enzymes: No results for input(s): "CKTOTAL", "CKMB", "CKMBINDEX", "TROPONINI" in the last 168 hours. BNP: Invalid input(s): "POCBNP" CBG: No results for input(s): "GLUCAP" in the last 168 hours.  Time coordinating discharge: 30 minutes  Signed:  Nolon Nations  APRN, MSN, FNP-C Patient Care [facility] [address] [place], [place] [address] [phone]  [facility] 07/10/2022, 1:31 PM

## 2022-07-10 NOTE — Progress Notes (Signed)
Patient refuse to having morning labs drawn at 0500.

## 2022-07-11 ENCOUNTER — Telehealth: Payer: Self-pay | Admitting: *Deleted

## 2022-07-11 NOTE — Transitions of Care (Post Inpatient/ED Visit) (Signed)
07/11/2022  Name: Alicia Villegas MRN: [id number] DOB: 10-28-2000  Today's TOC FU Call Status: Today's TOC FU Call Status:: Successful TOC FU Call Competed TOC FU Call Complete Date: 07/11/22  Transition Care Management Follow-up Telephone Call Date of Discharge: 07/10/22 Discharge Facility: [facility]) Type of Discharge: Inpatient Admission Primary Inpatient Discharge Diagnosis:: Sickle Cell pain crisis How have you been since you were released from the hospital?: Better Any questions or concerns?: Yes Patient Questions/Concerns:: DPR is concerned about increased admissions in the last year for sickle cell pain crisis Patient Questions/Concerns Addressed: Other: (Advised to discuss with PCP during upcoming visit on 07/24/22 and follow up with Hematology)  Items Reviewed: Did you receive and understand the discharge instructions provided?: Yes Medications obtained,verified, and reconciled?: Yes (Medications Reviewed) Any new allergies since your discharge?: No Dietary orders reviewed?: Yes Type of Diet Ordered:: increase hydration/water intake Do you have support at home?: Yes People in Home: parent(s) Name of Support/Comfort Primary Source: Lives with parents and siblings. DPR [name] is patient's medical advocate  Medications Reviewed Today: Medications Reviewed Today     Reviewed by Heidi Dach, RN (Registered Nurse) on 07/11/22 at 1632  Med List Status: <None>   Medication Order Taking? Sig Documenting Provider Last Dose Status Informant  acetaminophen (TYLENOL) 500 MG tablet [id number] Yes Take 500 mg by mouth every 6 (six) hours as needed for mild pain. [provider] Taking Active Self  hydroxyurea (HYDREA) 100 mg/mL SUSP [id number] Yes Take 1,200 mg by mouth at bedtime. [provider] Taking Active Self           Med Note (SATTERFIELD, Genoveva Ill   Tue Jun 05, 2022  5:54 PM) Patient verified dose is correct   ibuprofen (ADVIL) 200 MG tablet [id number]  Yes Take 200-800 mg by mouth every 6 (six) hours as needed for mild pain or headache. [provider] Taking Active Self  omeprazole (PRILOSEC) 20 MG capsule [id number] No Take 1 capsule (20 mg total) by mouth daily.  Patient taking differently: Take 20 mg by mouth daily as needed (heartburn).   Ivonne Andrew, NP Unknown Active Self  ondansetron (ZOFRAN) 4 MG tablet [id number] No Take 1 tablet (4 mg total) by mouth every 8 (eight) hours as needed for nausea or vomiting.  Patient not taking: Reported on 07/08/2022   Ivonne Andrew, NP Unknown Active Self  prochlorperazine (COMPAZINE) 10 MG tablet [id number] No Take 10 mg by mouth every 6 (six) hours as needed for nausea or vomiting ("for headaches"). [provider] Unknown Active Self  propranolol (INDERAL) 20 MG tablet [id number] No Take 1 tablet (20 mg total) by mouth daily.  Patient taking differently: Take 20 mg by mouth daily as needed (stomach pain).   Massie Maroon, FNP Unknown Active Self           Med Note Cyndie Chime, [place] I   Sun Jul 08, 2022 10:26 PM)    Vitamin D, Ergocalciferol, (DRISDOL) 1.25 MG (50000 UNIT) CAPS capsule [id number] No Take 1 capsule (50,000 Units total) by mouth every 7 (seven) days. Ivonne Andrew, NP Unknown Active Self           Med Note Cyndie Chime, [place] I   Sun Jul 08, 2022 10:26 PM)    voxelotor (OXBRYTA) 500 MG TABS tablet [id number] Yes Take 1,500 mg by mouth at bedtime. [provider] Taking Active Self           Med Note (SATTERFIELD, DARIUS E  Tue Jun 05, 2022  5:52 PM) Patient verified dose is correct   Water For Irrigation, Sterile (STERILE WATER FOR IRRIGATION) [id number] No   Patient not taking: Reported on 04/17/2022   [provider] Unknown Active Self  Med List Note Luster Landsberg, Tiffani, CPhT 03/09/21 1234):              Home Care and Equipment/Supplies: Were Home Health Services Ordered?: NA Any new equipment or medical supplies ordered?:  NA  Functional Questionnaire: Do you need assistance with bathing/showering or dressing?: No Do you need assistance with meal preparation?: No Do you need assistance with eating?: No Do you have difficulty maintaining continence: No Do you need assistance with getting out of bed/getting out of a chair/moving?: No Do you have difficulty managing or taking your medications?: No  Follow up appointments reviewed: PCP Follow-up appointment confirmed?: Yes Date of PCP follow-up appointment?: 07/24/22 Follow-up Provider: Cresenciano Genre Follow-up appointment confirmed?: NA Do you need transportation to your follow-up appointment?: Yes Transportation Need Intervention Addressed By:: Other: (DPR/[name] will assist with arranging medical transportation) Do you understand care options if your condition(s) worsen?: Yes-patient verbalized understanding (Discussed day infusion clinic with DPR)  SDOH Interventions Today    Flowsheet Row Most Recent Value  SDOH Interventions   Transportation Interventions Payor Benefit  [DPR/[name] will assist with arranging transportation to PCP on 07/24/22]     The patient was given information about care management services as a benefit of their Medicaid health plan today.   Designated Arboriculturist (DPR)  wishes to consider information provided and/or speak with a member of the care team before deciding about enrollment in care management services.    Estanislado Emms RN, BSN   Managed [facility] RN Care Coordinator [phone]

## 2022-07-13 LAB — BPAM RBC
Blood Product Expiration Date: 202406252359
ISSUE DATE / TIME: 202406032240
Unit Type and Rh: 600

## 2022-07-13 LAB — TYPE AND SCREEN

## 2022-07-24 ENCOUNTER — Ambulatory Visit: Payer: Medicaid Other | Admitting: Family Medicine

## 2022-07-24 VITALS — BP 95/58 | HR 77 | Temp 97.3°F | Wt 104.0 lb

## 2022-07-24 DIAGNOSIS — E559 Vitamin D deficiency, unspecified: Secondary | ICD-10-CM

## 2022-07-24 DIAGNOSIS — R0989 Other specified symptoms and signs involving the circulatory and respiratory systems: Secondary | ICD-10-CM | POA: Diagnosis not present

## 2022-07-24 DIAGNOSIS — D571 Sickle-cell disease without crisis: Secondary | ICD-10-CM

## 2022-07-24 LAB — POC COVID19 BINAXNOW: SARS Coronavirus 2 Ag: NEGATIVE

## 2022-07-24 MED ORDER — CETIRIZINE HCL 10 MG PO TABS
10.0000 mg | ORAL_TABLET | Freq: Every day | ORAL | 0 refills | Status: DC
Start: 2022-07-24 — End: 2022-10-12

## 2022-07-24 MED ORDER — FLUTICASONE PROPIONATE 50 MCG/ACT NA SUSP
2.0000 | Freq: Every day | NASAL | 6 refills | Status: DC
Start: 2022-07-24 — End: 2022-10-12

## 2022-07-24 MED ORDER — AZITHROMYCIN 250 MG PO TABS
ORAL_TABLET | ORAL | 0 refills | Status: AC
Start: 2022-07-24 — End: 2022-07-29
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
## 2022-07-24 NOTE — Patient Instructions (Signed)
Recommend Flonase 1 spray to each nare daily for total of 5 days Z-Pak 500 mg on day 1 and 250 mg on days 2 through 5 Increase fluid intake Increase rest and handwashing  Upper Respiratory Infection, Adult An upper respiratory infection (URI) affects the nose, throat, and upper airways that lead to the lungs. The most common type of URI is often called the common cold. URIs usually get better on their own, without medical treatment. What are the causes? A URI is caused by a germ (virus). You may catch these germs by: Breathing in droplets from an infected person's cough or sneeze. Touching something that has the germ on it (is contaminated) and then touching your mouth, nose, or eyes. What increases the risk? You are more likely to get a URI if: You are very young or very old. You have close contact with others, such as at work, school, or a health care facility. You smoke. You have long-term (chronic) heart or lung disease. You have a weakened disease-fighting system (immune system). You have nasal allergies or asthma. You have a lot of stress. You have poor nutrition. What are the signs or symptoms? Runny or stuffy (congested) nose. Cough. Sneezing. Sore throat. Headache. Feeling tired (fatigue). Fever. Not wanting to eat as much as usual. Pain in your forehead, behind your eyes, and over your cheekbones (sinus pain). Muscle aches. Redness or irritation of the eyes. Pressure in the ears or face. How is this treated? URIs usually get better on their own within 7-10 days. Medicines cannot cure URIs, but your doctor may recommend certain medicines to help relieve symptoms, such as: Over-the-counter cold medicines. Medicines to reduce coughing (cough suppressants). Coughing is a type of defense against infection that helps to clear the nose, throat, windpipe, and lungs (respiratory system). Take these medicines only as told by your doctor. Medicines to lower your fever. Follow  these instructions at home: Activity Rest as needed. If you have a fever, stay home from work or school until your fever is gone, or until your doctor says you may return to work or school. You should stay home until you cannot spread the infection anymore (you are not contagious). Your doctor may have you wear a face mask so you have less risk of spreading the infection. Relieving symptoms Rinse your mouth often with salt water. To make salt water, dissolve -1 tsp (3-6 g) of salt in 1 cup (237 mL) of warm water. Use a cool-mist humidifier to add moisture to the air. This can help you breathe more easily. Eating and drinking  Drink enough fluid to keep your pee (urine) pale yellow. Eat soups and other clear broths. General instructions  Take over-the-counter and prescription medicines only as told by your doctor. Do not smoke or use any products that contain nicotine or tobacco. If you need help quitting, ask your doctor. Avoid being where people are smoking (avoid secondhand smoke). Stay up to date on all your shots (immunizations), and get the flu shot every year. Keep all follow-up visits. How to prevent the spread of infection to others  Wash your hands with soap and water for at least 20 seconds. If you cannot use soap and water, use hand sanitizer. Avoid touching your mouth, face, eyes, or nose. Cough or sneeze into a tissue or your sleeve or elbow. Do not cough or sneeze into your hand or into the air. Contact a doctor if: You are getting worse, not better. You have any of these:  A fever or chills. Brown or red mucus in your nose. Yellow or brown fluid (discharge)coming from your nose. Pain in your face, especially when you bend forward. Swollen neck glands. Pain when you swallow. White areas in the back of your throat. Get help right away if: You have shortness of breath that gets worse. You have very bad or constant: Headache. Ear pain. Pain in your forehead, behind  your eyes, and over your cheekbones (sinus pain). Chest pain. You have long-lasting (chronic) lung disease along with any of these: Making high-pitched whistling sounds when you breathe, most often when you breathe out (wheezing). Long-lasting cough (more than 14 days). Coughing up blood. A change in your usual mucus. You have a stiff neck. You have changes in your: Vision. Hearing. Thinking. Mood. These symptoms may be an emergency. Get help right away. Call 911. Do not wait to see if the symptoms will go away. Do not drive yourself to the hospital. Summary An upper respiratory infection (URI) is caused by a germ (virus). The most common type of URI is often called the common cold. URIs usually get better within 7-10 days. Take over-the-counter and prescription medicines only as told by your doctor. This information is not intended to replace advice given to you by your health care provider. Make sure you discuss any questions you have with your health care provider. Document Revised: 08/24/2020 Document Reviewed: 08/24/2020 Elsevier Patient Education  2024 [employer].

## 2022-07-25 LAB — CMP14+CBC/D/PLT+FER+RETIC+V...: Potassium: 5 mmol/L (ref 3.5–5.2)

## 2022-07-26 LAB — CMP14+CBC/D/PLT+FER+RETIC+V...: Potassium: 5 mmol/L (ref 3.5–5.2)

## 2022-08-06 ENCOUNTER — Encounter: Payer: Self-pay | Admitting: Family Medicine

## 2022-08-08 DIAGNOSIS — D571 Sickle-cell disease without crisis: Secondary | ICD-10-CM | POA: Diagnosis not present

## 2022-10-12 ENCOUNTER — Inpatient Hospital Stay (HOSPITAL_COMMUNITY)
Admission: EM | Admit: 2022-10-12 | Discharge: 2022-10-14 | DRG: 812 | Disposition: A | Discharge status: Home / Self Care | Payer: Medicaid Other | Attending: Internal Medicine | Admitting: Internal Medicine

## 2022-10-12 ENCOUNTER — Emergency Department (HOSPITAL_COMMUNITY): Payer: Medicaid Other

## 2022-10-12 ENCOUNTER — Encounter (HOSPITAL_COMMUNITY): Payer: Self-pay

## 2022-10-12 ENCOUNTER — Other Ambulatory Visit: Payer: Self-pay

## 2022-10-12 DIAGNOSIS — R000 Tachycardia, unspecified: Secondary | ICD-10-CM | POA: Diagnosis not present

## 2022-10-12 DIAGNOSIS — D72829 Elevated white blood cell count, unspecified: Secondary | ICD-10-CM | POA: Diagnosis present

## 2022-10-12 DIAGNOSIS — D571 Sickle-cell disease without crisis: Secondary | ICD-10-CM | POA: Diagnosis not present

## 2022-10-12 DIAGNOSIS — D649 Anemia, unspecified: Secondary | ICD-10-CM | POA: Diagnosis present

## 2022-10-12 DIAGNOSIS — I517 Cardiomegaly: Secondary | ICD-10-CM | POA: Diagnosis not present

## 2022-10-12 DIAGNOSIS — D5700 Hb-SS disease with crisis, unspecified: Principal | ICD-10-CM | POA: Diagnosis present

## 2022-10-12 DIAGNOSIS — R0789 Other chest pain: Secondary | ICD-10-CM | POA: Diagnosis not present

## 2022-10-12 DIAGNOSIS — Z9081 Acquired absence of spleen: Secondary | ICD-10-CM | POA: Diagnosis not present

## 2022-10-12 DIAGNOSIS — D638 Anemia in other chronic diseases classified elsewhere: Secondary | ICD-10-CM | POA: Diagnosis not present

## 2022-10-12 LAB — RETICULOCYTES
Immature Retic Fract: 33.7 % — ABNORMAL HIGH (ref 2.3–15.9)
RBC.: 1.9 MIL/uL — ABNORMAL LOW (ref 3.87–5.11)
Retic Count, Absolute: 175 10*3/uL (ref 19.0–186.0)
Retic Ct Pct: 9.2 % — ABNORMAL HIGH (ref 0.4–3.1)

## 2022-10-12 LAB — CBC WITH DIFFERENTIAL/PLATELET
Abs Immature Granulocytes: 0.31 10*3/uL — ABNORMAL HIGH (ref 0.00–0.07)
Basophils Absolute: 0.1 10*3/uL (ref 0.0–0.1)
Basophils Relative: 1 %
Eosinophils Absolute: 0.2 10*3/uL (ref 0.0–0.5)
Eosinophils Relative: 2 %
HCT: 17.3 % — ABNORMAL LOW (ref 36.0–46.0)
Hemoglobin: 6.3 g/dL — CL (ref 12.0–15.0)
Immature Granulocytes: 3 %
Lymphocytes Relative: 37 %
Lymphs Abs: 4.2 10*3/uL — ABNORMAL HIGH (ref 0.7–4.0)
MCH: 34.4 pg — ABNORMAL HIGH (ref 26.0–34.0)
MCHC: 36.4 g/dL — ABNORMAL HIGH (ref 30.0–36.0)
MCV: 94.5 fL (ref 80.0–100.0)
Monocytes Absolute: 1.2 10*3/uL — ABNORMAL HIGH (ref 0.1–1.0)
Monocytes Relative: 10 %
Neutro Abs: 5.3 10*3/uL (ref 1.7–7.7)
Neutrophils Relative %: 47 %
Platelets: 296 10*3/uL (ref 150–400)
RBC: 1.83 MIL/uL — ABNORMAL LOW (ref 3.87–5.11)
RDW: 20 % — ABNORMAL HIGH (ref 11.5–15.5)
WBC: 11.1 10*3/uL — ABNORMAL HIGH (ref 4.0–10.5)
nRBC: 0.5 % — ABNORMAL HIGH (ref 0.0–0.2)

## 2022-10-12 LAB — PREPARE RBC (CROSSMATCH)

## 2022-10-12 LAB — COMPREHENSIVE METABOLIC PANEL
ALT: 16 U/L (ref 0–44)
AST: 41 U/L (ref 15–41)
Albumin: 4.4 g/dL (ref 3.5–5.0)
Alkaline Phosphatase: 77 U/L (ref 38–126)
Anion gap: 7 (ref 5–15)
BUN: 7 mg/dL (ref 6–20)
CO2: 22 mmol/L (ref 22–32)
Calcium: 9 mg/dL (ref 8.9–10.3)
Chloride: 107 mmol/L (ref 98–111)
Creatinine, Ser: <0.3 mg/dL — ABNORMAL LOW (ref 0.44–1.00)
Glucose, Bld: 98 mg/dL (ref 70–99)
Potassium: 3.7 mmol/L (ref 3.5–5.1)
Sodium: 136 mmol/L (ref 135–145)
Total Bilirubin: 2.4 mg/dL — ABNORMAL HIGH (ref 0.3–1.2)
Total Protein: 7.3 g/dL (ref 6.5–8.1)

## 2022-10-12 LAB — LACTATE DEHYDROGENASE: LDH: 538 U/L — ABNORMAL HIGH (ref 98–192)

## 2022-10-12 LAB — HCG, SERUM, QUALITATIVE: Preg, Serum: NEGATIVE

## 2022-10-12 MED ORDER — KETOROLAC TROMETHAMINE 15 MG/ML IJ SOLN
15.0000 mg | Freq: Four times a day (QID) | INTRAMUSCULAR | Status: DC
  Administered 2022-10-12 – 2022-10-14 (×7): 15 mg via INTRAVENOUS
  Filled 2022-10-12 (×7): qty 1

## 2022-10-12 MED ORDER — SENNOSIDES-DOCUSATE SODIUM 8.6-50 MG PO TABS
1.0000 | ORAL_TABLET | Freq: Two times a day (BID) | ORAL | Status: DC
  Administered 2022-10-12 – 2022-10-14 (×3): 1 via ORAL
  Filled 2022-10-12 (×4): qty 1

## 2022-10-12 MED ORDER — SODIUM CHLORIDE 0.9% FLUSH: 9.0000 mL | INTRAVENOUS | Status: DC | PRN

## 2022-10-12 MED ORDER — ACETAMINOPHEN 325 MG PO TABS
650.0000 mg | ORAL_TABLET | Freq: Four times a day (QID) | ORAL | Status: DC | PRN
  Administered 2022-10-12 – 2022-10-13 (×3): 650 mg via ORAL
  Filled 2022-10-12 (×3): qty 2

## 2022-10-12 MED ORDER — HYDROMORPHONE HCL 1 MG/ML IJ SOLN
1.0000 mg | INTRAMUSCULAR | Status: AC
  Administered 2022-10-12: 1 mg via INTRAVENOUS
  Filled 2022-10-12: qty 1

## 2022-10-12 MED ORDER — DIPHENHYDRAMINE HCL 25 MG PO CAPS
25.0000 mg | ORAL_CAPSULE | ORAL | Status: DC | PRN
  Administered 2022-10-12 – 2022-10-13 (×2): 25 mg via ORAL
  Filled 2022-10-12 (×2): qty 1

## 2022-10-12 MED ORDER — HYDROMORPHONE HCL 1 MG/ML IJ SOLN
0.5000 mg | INTRAMUSCULAR | Status: DC | PRN
  Administered 2022-10-12 (×2): 0.5 mg via INTRAVENOUS
  Filled 2022-10-12: qty 1
  Filled 2022-10-12: qty 0.5

## 2022-10-12 MED ORDER — HYDROMORPHONE HCL 1 MG/ML IJ SOLN
0.5000 mg | INTRAMUSCULAR | Status: AC
  Administered 2022-10-12: 0.5 mg via INTRAVENOUS

## 2022-10-12 MED ORDER — SODIUM CHLORIDE 0.9% IV SOLUTION: Freq: Once | INTRAVENOUS | Status: AC

## 2022-10-12 MED ORDER — ACETAMINOPHEN 325 MG PO TABS
325.0000 mg | ORAL_TABLET | Freq: Once | ORAL | Status: AC
  Administered 2022-10-12: 325 mg via ORAL
  Filled 2022-10-12: qty 1

## 2022-10-12 MED ORDER — DEXTROSE-SODIUM CHLORIDE 5-0.45 % IV SOLN: INTRAVENOUS | Status: DC

## 2022-10-12 MED ORDER — HYDROMORPHONE HCL 1 MG/ML IJ SOLN
1.0000 mg | Freq: Once | INTRAMUSCULAR | Status: AC
  Administered 2022-10-12: 1 mg via INTRAVENOUS

## 2022-10-12 MED ORDER — MORPHINE SULFATE 1 MG/ML IV SOLN PCA
INTRAVENOUS | Status: DC
  Administered 2022-10-12: 30 mg via INTRAVENOUS
  Administered 2022-10-13: 0.1 mg via INTRAVENOUS
  Administered 2022-10-13: 1 mg via INTRAVENOUS
  Administered 2022-10-13: 3 mg via INTRAVENOUS
  Administered 2022-10-14 (×3): 1 mg via INTRAVENOUS
  Filled 2022-10-12 (×2): qty 30

## 2022-10-12 MED ORDER — VOXELOTOR 500 MG PO TABS
1500.0000 mg | ORAL_TABLET | Freq: Every day | ORAL | Status: DC
  Administered 2022-10-12 – 2022-10-13 (×2): 1500 mg via ORAL

## 2022-10-12 MED ORDER — HYDROMORPHONE HCL 1 MG/ML IJ SOLN
1.0000 mg | Freq: Once | INTRAMUSCULAR | Status: DC
  Filled 2022-10-12: qty 1

## 2022-10-12 MED ORDER — HYDROMORPHONE HCL 1 MG/ML IJ SOLN
1.0000 mg | Freq: Once | INTRAMUSCULAR | Status: AC
  Administered 2022-10-12: 1 mg via SUBCUTANEOUS
  Filled 2022-10-12: qty 1

## 2022-10-12 MED ORDER — NALOXONE HCL 0.4 MG/ML IJ SOLN: 0.4000 mg | INTRAMUSCULAR | Status: DC | PRN

## 2022-10-12 MED ORDER — DIPHENHYDRAMINE HCL 50 MG/ML IJ SOLN
12.5000 mg | Freq: Once | INTRAMUSCULAR | Status: AC
  Administered 2022-10-12: 12.5 mg via INTRAVENOUS
  Filled 2022-10-12 (×2): qty 1

## 2022-10-12 MED ORDER — ONDANSETRON HCL 4 MG/2ML IJ SOLN
4.0000 mg | INTRAMUSCULAR | Status: DC | PRN
  Administered 2022-10-12: 4 mg via INTRAVENOUS
  Filled 2022-10-12: qty 2

## 2022-10-12 MED ORDER — OXYCODONE-ACETAMINOPHEN 5-325 MG PO TABS
1.0000 | ORAL_TABLET | ORAL | Status: DC | PRN
  Administered 2022-10-12: 1 via ORAL
  Filled 2022-10-12: qty 1

## 2022-10-12 MED ORDER — POLYETHYLENE GLYCOL 3350 17 G PO PACK: 17.0000 g | PACK | Freq: Every day | ORAL | Status: DC | PRN

## 2022-10-12 MED ORDER — ONDANSETRON HCL 4 MG/2ML IJ SOLN: 4.0000 mg | Freq: Four times a day (QID) | INTRAMUSCULAR | Status: DC | PRN

## 2022-10-12 NOTE — Progress Notes (Signed)
Resident had temperature of 100.9 F orally  post blood transfusion, took again prior to transfusion and it was 100.3 F orally. Contacted Anthoney Harada, NP who gave permission to continue with blood transfusion. 325 mg Tylenol ordered and administered and provided cool compress. At 15 min post blood check temperature was 98.8 F axillary.

## 2022-10-12 NOTE — ED Notes (Signed)
ED TO INPATIENT HANDOFF REPORT  ED Nurse Name and Phone #: Lennan Malone  S Name/Age/Gender Alicia Villegas 22 y.o. female Room/Bed: WA20/WA20  Code Status   Code Status: Full Code  Home/SNF/Other Home Patient oriented to: self, place, time, and situation Is this baseline? 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 24 g iv lac, 50 mcg Fentanyl.    Allergies No Known Allergies  Level of Care/Admitting Diagnosis ED Disposition     ED Disposition  Admit   Condition  --   Comment  H[facility]: [facility] [[id number]]  Level of Care: Med-Surg [16]  May admit patient to [facility] or [facility] if equivalent level of care is available:: No  Covid Evaluation: Asymptomatic - no recent exposure (last 10 days) testing not required  Diagnosis: Sickle cell pain crisis [facility]) [[id number]]  Admitting Physician: Massie Maroon [[id number]]  Attending Physician: Quentin Angst [[id number]]  Certification:: I certify this patient will need inpatient services for at least 2 midnights  Expected Medical Readiness: 10/15/2022          B Medical/Surgery History 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   Sickle cell anemia ([name])    Sickle cell anemia ([name])    type SS   Past Surgical History:  Procedure Laterality Date   CHOLECYSTECTOMY     DG GALL BLADDER     HERNIA REPAIR     SPLENECTOMY     SPLENECTOMY, TOTAL       A IV Location/Drains/Wounds Patient Lines/Drains/Airways Status     Active Line/Drains/Airways     Name Placement date Placement time Site Days   Peripheral IV 10/12/22 20 G Anterior;Left;Proximal;Upper Arm 10/12/22  0533  Arm  less than 1   Peripheral IV 10/12/22  Posterior;Right Hand 10/12/22  0618  Hand  less than 1            Intake/Output Last 24 hours No intake or output data in the 24 hours ending 10/12/22 1457  Labs/Imaging Results for orders placed or performed during the hospital encounter of 10/12/22 (from the past 48 hour(s))  Comprehensive metabolic panel     Status: Abnormal   Collection Time: 10/12/22  5:17 AM  Result Value Ref Range   Sodium 136 135 - 145 mmol/L   Potassium 3.7 3.5 - 5.1 mmol/L   Chloride 107 98 - 111 mmol/L   CO2 22 22 - 32 mmol/L   Glucose, Bld 98 70 - 99 mg/dL    Comment: Glucose reference range applies only to samples taken after fasting for at least 8 hours.   BUN 7 6 - 20 mg/dL   Creatinine, Ser <8.29 (L) 0.44 - 1.00 mg/dL   Calcium 9.0 8.9 - 56.2 mg/dL   Total Protein 7.3 6.5 - 8.1 g/dL   Albumin 4.4 3.5 - 5.0 g/dL   AST 41 15 - 41 U/L   ALT 16 0 - 44 U/L   Alkaline Phosphatase 77 38 - 126 U/L   Total Bilirubin 2.4 (H) 0.3 - 1.2 mg/dL   GFR, Estimated NOT CALCULATED >60 mL/min    Comment: (NOTE) Calculated using the CKD-EPI Creatinine Equation (2021)    Anion gap 7 5 -  15    Comment: Performed at [facility], [address] [place], [place] [address]  CBC with Differential     Status: Abnormal   Collection Time: 10/12/22  5:17 AM  Result Value Ref Range   WBC 11.1 (H) 4.0 - 10.5 K/uL   RBC 1.83 (L) 3.87 - 5.11 MIL/uL   Hemoglobin 6.3 (LL) 12.0 - 15.0 g/dL    Comment: REPEATED TO VERIFY THIS CRITICAL RESULT HAS VERIFIED AND BEEN CALLED TO [facility], T RN BY ABDULHALIM,MALAIKA ON 09 06 2024 AT 0605, AND HAS BEEN READ BACK.     HCT 17.3 (L) 36.0 - 46.0 %   MCV 94.5 80.0 - 100.0 fL   MCH 34.4 (H) 26.0 - 34.0 pg   MCHC 36.4 (H) 30.0 - 36.0 g/dL   RDW 13.0 (H) 86.5 - 78.4 %   Platelets 296 150 - 400 K/uL   nRBC 0.5 (H) 0.0 - 0.2 %   Neutrophils Relative % 47 %   Neutro Abs 5.3 1.7 - 7.7 K/uL   Lymphocytes Relative 37 %   Lymphs Abs 4.2 (H) 0.7 - 4.0 K/uL   Monocytes  Relative 10 %   Monocytes Absolute 1.2 (H) 0.1 - 1.0 K/uL   Eosinophils Relative 2 %   Eosinophils Absolute 0.2 0.0 - 0.5 K/uL   Basophils Relative 1 %   Basophils Absolute 0.1 0.0 - 0.1 K/uL   Immature Granulocytes 3 %   Abs Immature Granulocytes 0.31 (H) 0.00 - 0.07 K/uL   Polychromasia PRESENT    Sickle Cells MARKED    Target Cells PRESENT     Comment: Performed at [facility], [address], [place], [place] [address]  Reticulocytes     Status: Abnormal   Collection Time: 10/12/22  5:17 AM  Result Value Ref Range   Retic Ct Pct 9.2 (H) 0.4 - 3.1 %    Comment: RESULTS CONFIRMED BY MANUAL DILUTION   RBC. 1.90 (L) 3.87 - 5.11 MIL/uL   Retic Count, Absolute 175.0 19.0 - 186.0 K/uL   Immature Retic Fract 33.7 (H) 2.3 - 15.9 %    Comment: Performed at [facility], [address] [place], [place] [address]  hCG, serum, qualitative     Status: None   Collection Time: 10/12/22  5:17 AM  Result Value Ref Range   Preg, Serum NEGATIVE NEGATIVE    Comment:        THE SENSITIVITY OF THIS METHODOLOGY IS >10 mIU/mL. Performed at [facility], [address] [place], [place] [address]   Lactate dehydrogenase     Status: Abnormal   Collection Time: 10/12/22  5:17 AM  Result Value Ref Range   LDH 538 (H) 98 - 192 U/L    Comment: Performed at [facility], [address] [place], [place] [address]  Prepare RBC (crossmatch)     Status: None   Collection Time: 10/12/22  6:24 AM  Result Value Ref Range   Order Confirmation      ORDER PROCESSED BY BLOOD BANK Performed at [facility], [address], [place], [place] [address]   Type and screen [facility]     Status: None (Preliminary result)   Collection Time: 10/12/22  6:25 AM  Result Value Ref Range   ABO/RH(D) AB POS    Antibody Screen POS    Sample Expiration 10/15/2022,2359    Antibody Identification ANTI FYA  (Duffy a)  Unit Number [id number]    Blood Component Type RED CELLS,LR    Unit division 00    Status of Unit ALLOCATED    Donor AG Type      NEGATIVE FOR E ANTIGEN NEGATIVE FOR KELL ANTIGEN NEGATIVE FOR M ANTIGEN NEGATIVE FOR S ANTIGEN NEGATIVE FOR DUFFY A ANTIGEN NEGATIVE FOR KIDD B ANTIGEN   Transfusion Status OK TO TRANSFUSE    Crossmatch Result COMPATIBLE    DG Chest Port 1 View  Result Date: 10/12/2022 CLINICAL DATA:  Sickle cell related chest pain. EXAM: PORTABLE CHEST 1 VIEW COMPARISON:  PA and lateral 11/15/2021 FINDINGS: Mild cardiomegaly is again noted with no evidence of CHF. The lungs are clear. The mediastinum is normally outlined. Mild thoracic dextroscoliosis. Compare: Unchanged. IMPRESSION: No evidence of acute chest disease.  Mild cardiomegaly. Electronically Signed   By: Almira Bar M.D.   On: 10/12/2022 05:51    Pending Labs [employer] (From admission, onward)     Start     Ordered   Signed and Held  Basic metabolic panel  Daily,   R      Signed and Held   Signed and Held  CBC  Daily,   R      Signed and Held            Vitals/Pain Today's Vitals   10/12/22 1135 10/12/22 1247 10/12/22 1251 10/12/22 1437  BP:  113/76    Pulse:  (!) 122    Resp:  (!) 22    Temp: 100.2 F (37.9 C)     TempSrc: Oral     SpO2:  98% 93%   Weight:      Height:      PainSc:    10-Worst pain ever    Isolation Precautions No active isolations  Medications Medications  dextrose 5 % and 0.45 % NaCl infusion ( Intravenous Rate/Dose Change 10/12/22 1404)  diphenhydrAMINE (BENADRYL) capsule 25-50 mg (25 mg Oral Given 10/12/22 0532)  ondansetron (ZOFRAN) injection 4 mg (4 mg Intravenous Given 10/12/22 0937)  0.9 %  sodium chloride infusion (Manually program via Guardrails IV Fluids) (has no administration in time range)  HYDROmorphone (DILAUDID) injection 0.5 mg (0.5 mg Intravenous Given 10/12/22 1250)  oxyCODONE-acetaminophen (PERCOCET/ROXICET) 5-325 MG per tablet 1  tablet (1 tablet Oral Given 10/12/22 1438)  HYDROmorphone (DILAUDID) injection 1 mg (1 mg Subcutaneous Given 10/12/22 0532)  HYDROmorphone (DILAUDID) injection 0.5 mg (0.5 mg Intravenous Given 10/12/22 0459)  HYDROmorphone (DILAUDID) injection 1 mg (1 mg Intravenous Given 10/12/22 0631)  HYDROmorphone (DILAUDID) injection 1 mg (1 mg Intravenous Given by Other 10/12/22 0645)  diphenhydrAMINE (BENADRYL) injection 12.5 mg (12.5 mg Intravenous Given 10/12/22 0946)  HYDROmorphone (DILAUDID) injection 1 mg (1 mg Intravenous Given 10/12/22 0937)  HYDROmorphone (DILAUDID) injection 1 mg (1 mg Intravenous Given 10/12/22 1138)    Mobility walks     Focused Assessments    R Recommendations: See Admitting Provider Note  Report given to:   Additional Notes:

## 2022-10-12 NOTE — ED Provider Notes (Signed)
    Accepted handoff at shift change from [facility]. Please see prior provider note for more detail.   Briefly: Patient is 22 y.o. "Patient presents to the emergency department complaining of pain in the chest, abdomen, and back thought to be due to underlying sickle cell disease. Patient states she has history of sickle cell crises and this feels similar to previous episodes. She has taken all of her home meds with no relief. EMS administered 50 mcg of fentanyl during transport. Patient denies nausea, vomiting, shortness of breath, extremity pain. Past medical history significant for sickle cell disease, hepatomegaly, acute chest syndrome, polypharmacy"  DDX: concern for  sickle cell pain crisis, sickle cell disease with pain, chronic pain, others   Plan:  -dispo pending 3rd dose of dilaudid and blood transfusion. - patient still in severe pain    - Admitting patient for sickle cell pain crisis.  - 21y/o F with PMHx sickle cell who presents to ED concerned for chest/abd/back pain. Patient stating that symptoms feel similar to past pain crisis episodes. Denies nausea, vomiting, respiratory complaint. - CMP reassuring. Hcg negative. CBC with mild leukocytosis at 11.1. Hgb 6.3 - providing patient with 1U PRBC. Chest xray unremarkable. EKG reassuring. -Patient was given 3 rounds of dilaudid without resolution of pain. - [place] admitting provider       Dorthy Cooler, [place] 10/12/22 1610    Derwood Kaplan, MD 10/12/22 [phone]

## 2022-10-12 NOTE — H&P (Signed)
H&P  Patient Demographics:  Alicia Villegas, is a 22 y.o. female  MRN: [id number]   DOB - 03/22/2000  Admit Date - 10/12/2022  Outpatient Primary MD for the patient is Massie Maroon, FNP  Chief Complaint  Patient presents with   Sickle Cell Pain Crisis      HPI:   Alicia Villegas  is a 22 y.o. female with a medical history significant for sickle cell disease and anemia of chronic disease that presents to the emergency department with complaints of pain to chest, back, and abdomen for the past 2 days.  Patient states that she was awakened suddenly with pain to her chest that has been worsening in intensity.  She has not identified any inciting factors concerning crisis.  Patient states that pain has been unrelieved by her home medications.  Patient generally has very well-controlled sickle cell disease with infrequent pain crises.  Patient endorses some fatigue and weakness.  She also endorses some shortness of breath with exertion.  Patient denies headache, blurry vision, dizziness, urinary symptoms, nausea, vomiting, or diarrhea.  No sick contacts or recent travel.  ER course: Vital signs show:BP 106/70   Pulse (!) 106   Temp 100.2 F (37.9 C) (Oral)   Resp (!) 22   Ht 5\' 1"  (1.549 m)   Wt 47.2 kg   LMP  (LMP Unknown)   SpO2 95%   BMI 19.66 kg/m  Complete metabolic panel notable for total bilirubin of 2.4, otherwise unremarkable.  CBC shows WBCs elevated at 11.1, hemoglobin 6.3 g/dL, which is below her baseline of 7-8 g/dL, and platelets 034,742.  Reticulocyte count 9.2%.  Chest x-ray shows no evidence of acute chest disease.  Mild cardiomegaly.  Patient's pain persists spite IV Dilaudid, and IV fluids.  Sickle cell team contacted to admit for symptomatic anemia in the setting of sickle cell crisis.   Review of systems:  Review of Systems  Constitutional: Negative.   HENT: Negative.    Eyes: Negative.   Respiratory: Negative.    Cardiovascular: Negative.   Gastrointestinal:   Positive for abdominal pain. Negative for nausea and vomiting.  Genitourinary:  Negative for dysuria, flank pain, frequency, hematuria and urgency.  Musculoskeletal:  Positive for back pain.  Skin: Negative.   Neurological: Negative.   Psychiatric/Behavioral: Negative.       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 Authorizing Provider  hydroxyurea (HYDREA) 100 mg/mL SUSP Take 1,200 mg by mouth at bedtime.   Yes [provider]  ibuprofen (  ADVIL) 200 MG tablet Take 200-800 mg by mouth every 6 (six) hours as needed for mild pain or headache.   Yes [provider]  voxelotor (OXBRYTA) 500 MG TABS tablet Take 1,500 mg by mouth at bedtime. 10/19/20  Yes [provider]  prochlorperazine (COMPAZINE) 10 MG tablet Take 10 mg by mouth every 6 (six) hours as needed for nausea or vomiting ("for headaches"). Patient not taking: Reported on 10/12/2022    [provider]  Water For Irrigation, Sterile (STERILE WATER FOR IRRIGATION)  02/27/22   [provider]     Allergies:   No Known Allergies   Physical Exam:   Vitals:   Vitals:   10/12/22 0645 10/12/22 0748  BP: 106/61 106/70  Pulse: 92  (!) 106  Resp: 16 (!) 22  Temp:  98.3 F (36.8 C)  SpO2: 95%     Physical Exam: Constitutional: Patient appears well-developed and well-nourished. Not in obvious distress. HENT: Normocephalic, atraumatic, External right and left ear normal. Oropharynx is clear and moist.  Eyes: Conjunctivae and EOM are normal. PERRLA, no scleral icterus. Neck: Normal ROM. Neck supple. No JVD. No tracheal deviation. No thyromegaly. CVS: RRR, S1/S2 +, no murmurs, no gallops, no carotid bruit.  Pulmonary: Effort and breath sounds normal, no stridor, rhonchi, wheezes, rales.  Abdominal: Soft. BS +, no distension, tenderness, rebound or guarding.  Musculoskeletal: Normal range of motion. No edema and no tenderness.  Lymphadenopathy: No lymphadenopathy noted, cervical, inguinal or axillary Neuro: Alert. Normal reflexes, muscle tone coordination. No cranial nerve deficit. Skin: Skin is warm and dry. No rash noted. Not diaphoretic. No erythema. No pallor. Psychiatric: Normal mood and affect. Behavior, judgment, thought content normal.   Data Review:   CBC Recent Labs  Lab 10/12/22 0517  WBC 11.1*  HGB 6.3*  HCT 17.3*  PLT 296  MCV 94.5  MCH 34.4*  MCHC 36.4*  RDW 20.0*  LYMPHSABS 4.2*  MONOABS 1.2*  EOSABS 0.2  BASOSABS 0.1   ------------------------------------------------------------------------------------------------------------------  Chemistries  Recent Labs  Lab 10/12/22 0517  NA 136  K 3.7  CL 107  CO2 22  GLUCOSE 98  BUN 7  CREATININE <0.30*  CALCIUM 9.0  AST 41  ALT 16  ALKPHOS 77  BILITOT 2.4*   ------------------------------------------------------------------------------------------------------------------ CrCl cannot be calculated (This lab value cannot be used to calculate CrCl because it is not a number: <0.30). ------------------------------------------------------------------------------------------------------------------ No results for input(s): "TSH",  "T4TOTAL", "T3FREE", "THYROIDAB" in the last 72 hours.  Invalid input(s): "FREET3"  Coagulation profile No results for input(s): "INR", "PROTIME" in the last 168 hours. ------------------------------------------------------------------------------------------------------------------- No results for input(s): "DDIMER" in the last 72 hours. -------------------------------------------------------------------------------------------------------------------  Cardiac Enzymes No results for input(s): "CKMB", "TROPONINI", "MYOGLOBIN" in the last 168 hours.  Invalid input(s): "CK" ------------------------------------------------------------------------------------------------------------------ No results found for: "BNP"  ---------------------------------------------------------------------------------------------------------------  Urinalysis    Component Value Date/Time   COLORURINE AMBER (A) 06/04/2022 2212   APPEARANCEUR CLEAR 06/04/2022 2212   LABSPEC 1.006 06/04/2022 2212   PHURINE 5.0 06/04/2022 2212   GLUCOSEU NEGATIVE 06/04/2022 2212   HGBUR SMALL (A) 06/04/2022 2212   BILIRUBINUR NEGATIVE 06/04/2022 2212   BILIRUBINUR negative 04/24/2022 1537   KETONESUR 5 (A) 06/04/2022 2212   PROTEINUR NEGATIVE 06/04/2022 2212   UROBILINOGEN 1.0 04/24/2022 1537   UROBILINOGEN 1.0 01/13/2014 1612   NITRITE NEGATIVE 06/04/2022 2212   LEUKOCYTESUR NEGATIVE 06/04/2022 2212    ----------------------------------------------------------------------------------------------------------------   Imaging Results:    DG Chest Port 1 View  Result Date: 10/12/2022 CLINICAL DATA:  Sickle cell related chest pain. EXAM: PORTABLE CHEST 1 VIEW COMPARISON:  PA and lateral 11/15/2021 FINDINGS: Mild cardiomegaly is again noted with no evidence of CHF. The lungs are clear. The mediastinum is normally outlined. Mild thoracic dextroscoliosis. Compare: Unchanged. IMPRESSION: No evidence of acute chest disease.   Mild cardiomegaly. Electronically Signed   By: Almira Bar M.D.   On: 10/12/2022 05:51     Assessment & Plan:  Active Problems:   Sickle cell pain crisis ([name])  Sickle cell disease with pain crisis: Admit.  Initiate IV morphine PCA, partial dose. Decrease IV fluids to 0.45% saline at 50 mL/h Toradol 15 mg IV every 6 hours -325 mg every 4 hours as needed for severe breakthrough pain Monitor vital signs very closely, reevaluate pain scale regularly, and supplemental oxygen as needed patient will be reevaluated for pain in the context of function and relationship to baseline as care progresses.  Symptomatic anemia: Patient endorses fatigue and some shortness of breath.  She is maintaining oxygen saturation at 90% on room air.  Transfuse 2 units PRBCs as blood becomes available.  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 Monitor closely.  Labs in AM.   Leukocytosis: Very mild leukocytosis.  More than likely reactive.  Continue to monitor closely.  Labs in AM.    DVT Prophylaxis: Subcut Lovenox   AM Labs Ordered, also please review Full Orders  Family Communication: Admission, patient's condition and plan of care including tests being ordered have been discussed with the patient who indicate understanding and agree with the plan and Code Status.  Code Status: Full Code  Consults called: None    Admission status: Inpatient    Time spent in minutes : 30 minutes  Nolon Nations  APRN, MSN, FNP-C Patient [facility] [address] [place], [place] [address] [phone]  10/12/2022 at 9:28 AM

## 2022-10-12 NOTE — ED Notes (Signed)
When administer first pain med does, pt IV stopped working when attempting to flush the med through. PA advised to administer subcutaneous dose.

## 2022-10-12 NOTE — ED Triage Notes (Signed)
Pt BIB EMS with reports of SCC with pain all over tonight. 24 g iv lac, 50 mcg Fentanyl.

## 2022-10-12 NOTE — ED Notes (Signed)
Sats decreased to 86% after administration of Dilaudid. O2@2L /[place] applied and sats improved to 95%. Provider notified.

## 2022-10-12 NOTE — ED Provider Notes (Signed)
[place] EMERGENCY DEPARTMENT AT [facility] Provider Note   CSN: [id number] Arrival date & time: 10/12/22  1191     History  Chief Complaint  Patient presents with   Sickle Cell Pain Crisis    Alicia Villegas is a 22 y.o. female.  Patient presents to the emergency department complaining of pain in the chest, abdomen, and back thought to be due to underlying sickle cell disease.  Patient states she has history of sickle cell crises and this feels similar to previous episodes.  She has taken all of her home meds with no relief.  EMS administered 50 mcg of fentanyl during transport.  Patient denies nausea, vomiting, shortness of breath, extremity pain.  Past medical history significant for sickle cell disease, hepatomegaly, acute chest syndrome, polypharmacy   Sickle Cell Pain Crisis      Home Medications Prior to Admission medications   Medication Sig Start Date End Date Taking? Authorizing Provider  hydroxyurea (HYDREA) 100 mg/mL SUSP Take 1,200 mg by mouth at bedtime.   Yes [provider]  ibuprofen (ADVIL) 200 MG tablet Take 200-800 mg by mouth every 6 (six) hours as needed for mild pain or headache.   Yes [provider]  voxelotor (OXBRYTA) 500 MG TABS tablet Take 1,500 mg by mouth at bedtime. 10/19/20  Yes [provider]  prochlorperazine (COMPAZINE) 10 MG tablet Take 10 mg by mouth every 6 (six) hours as needed for nausea or vomiting ("for headaches"). Patient not taking: Reported on 10/12/2022    [provider]  Water For Irrigation, Sterile (STERILE WATER FOR IRRIGATION)  02/27/22   [provider]      Allergies    Patient has no known allergies.    Review of Systems   Review of Systems  Physical Exam Updated Vital Signs BP 107/66   Pulse 92   Temp 98.3 F (36.8 C) (Oral)   Resp 18   Ht 5\' 1"  (1.549 m)   Wt 47.2 kg   LMP  (LMP Unknown)   SpO2 95%   BMI 19.66 kg/m  Physical Exam Vitals and nursing note  reviewed.  Constitutional:      General: She is not in acute distress.    Appearance: She is well-developed.  HENT:     Head: Normocephalic and atraumatic.  Eyes:     Conjunctiva/sclera: Conjunctivae normal.  Cardiovascular:     Rate and Rhythm: Normal rate and regular rhythm.  Pulmonary:     Effort: Pulmonary effort is normal. No respiratory distress.     Breath sounds: Normal breath sounds.  Abdominal:     Palpations: Abdomen is soft.     Tenderness: There is no abdominal tenderness.  Musculoskeletal:        General: Tenderness present. No swelling.     Cervical back: Neck supple.     Comments: Tenderness to palpation of chest wall  Skin:    General: Skin is warm and dry.     Capillary Refill: Capillary refill takes less than 2 seconds.  Neurological:     Mental Status: She is alert.  Psychiatric:        Mood and Affect: Mood normal.     ED Results / Procedures / Treatments   Labs (all labs ordered are listed, but only abnormal results are displayed) Labs Reviewed  COMPREHENSIVE METABOLIC PANEL - Abnormal; Notable for the following components:      Result Value   Creatinine, Ser <0.30 (*)    Total  Bilirubin 2.4 (*)    All other components within normal limits  CBC WITH DIFFERENTIAL/PLATELET - Abnormal; Notable for the following components:   WBC 11.1 (*)    RBC 1.83 (*)    Hemoglobin 6.3 (*)    HCT 17.3 (*)    MCH 34.4 (*)    MCHC 36.4 (*)    RDW 20.0 (*)    nRBC 0.5 (*)    Lymphs Abs 4.2 (*)    Monocytes Absolute 1.2 (*)    Abs Immature Granulocytes 0.31 (*)    All other components within normal limits  HCG, SERUM, QUALITATIVE  RETICULOCYTES  TYPE AND SCREEN  PREPARE RBC (CROSSMATCH)    EKG None  Radiology DG Chest Port 1 View  Result Date: 10/12/2022 CLINICAL DATA:  Sickle cell related chest pain. EXAM: PORTABLE CHEST 1 VIEW COMPARISON:  PA and lateral 11/15/2021 FINDINGS: Mild cardiomegaly is again noted with no evidence of CHF. The lungs are clear.  The mediastinum is normally outlined. Mild thoracic dextroscoliosis. Compare: Unchanged. IMPRESSION: No evidence of acute chest disease.  Mild cardiomegaly. Electronically Signed   By: Almira Bar M.D.   On: 10/12/2022 05:51    Procedures Procedures    Medications Ordered in ED Medications  dextrose 5 % and 0.45 % NaCl infusion ( Intravenous New Bag/Given 10/12/22 0540)  HYDROmorphone (DILAUDID) injection 1 mg (has no administration in time range)  diphenhydrAMINE (BENADRYL) capsule 25-50 mg (25 mg Oral Given 10/12/22 0532)  ondansetron (ZOFRAN) injection 4 mg (has no administration in time range)  0.9 %  sodium chloride infusion (Manually program via Guardrails IV Fluids) (has no administration in time range)  diphenhydrAMINE (BENADRYL) injection 12.5 mg (has no administration in time range)  HYDROmorphone (DILAUDID) injection 1 mg (1 mg Subcutaneous Given 10/12/22 0532)  HYDROmorphone (DILAUDID) injection 0.5 mg (0.5 mg Intravenous Given 10/12/22 0459)  HYDROmorphone (DILAUDID) injection 1 mg (1 mg Intravenous Given 10/12/22 0631)    ED Course/ Medical Decision Making/ A&P                                 Medical Decision Making Amount and/or Complexity of Data Reviewed Labs: ordered. Radiology: ordered.  Risk Prescription drug management.   This patient presents to the ED for concern of chest abdomen and back pain, this involves an extensive number of treatment options, and is a complaint that carries with it a high risk of complications and morbidity.  The differential diagnosis includes sickle cell pain crisis, sickle cell disease with pain, chronic pain, others   Co morbidities that complicate the patient evaluation  History of sickle cell disease   Additional history obtained:  Additional history obtained from EMS External records from outside source obtained and reviewed including admission notes from [facility] from July   Lab Tests:  I Ordered, and personally  interpreted labs.  The pertinent results include: Hemoglobin 6.3   Imaging Studies ordered:  I ordered imaging studies including chest x-ray I independently visualized and interpreted imaging which showed no acute disease I agree with the radiologist interpretation   Problem List / ED Course / Critical interventions / Medication management   I ordered medication including Dilaudid for pain, Zofran for nausea, Benadryl for itching, 1 unit PRBC for symptomatic anemia Reevaluation of the patient after these medicines showed that the patient stayed the same I have reviewed the patients home medicines and have made adjustments as needed  Social Determinants of Health:  Patient has Medicaid for her primary health insurance type   Test / Admission - Considered:  Patient may need admission for pain management.  Patient currently getting third dose of Dilaudid.  Plan to transfer care to [facility], PA-C at shift handoff.  Patient at this time still rating pain 9 out of 10 in severity.  Feel it is likely she may need admission.  1 unit PRBC ordered which has not been started yet. Disposition pending        Final Clinical Impression(s) / ED Diagnoses Final diagnoses:  Sickle cell pain crisis [facility])    Rx / DC Orders ED Discharge Orders     None         Pamala Duffel 10/12/22 0631    Glynn Octave, MD 10/12/22 [phone]

## 2022-10-13 DIAGNOSIS — D5700 Hb-SS disease with crisis, unspecified: Secondary | ICD-10-CM | POA: Diagnosis not present

## 2022-10-13 LAB — BASIC METABOLIC PANEL
Anion gap: 8 (ref 5–15)
BUN: 8 mg/dL (ref 6–20)
CO2: 22 mmol/L (ref 22–32)
Calcium: 8.9 mg/dL (ref 8.9–10.3)
Chloride: 106 mmol/L (ref 98–111)
Creatinine, Ser: <0.3 mg/dL — ABNORMAL LOW (ref 0.44–1.00)
Glucose, Bld: 99 mg/dL (ref 70–99)
Potassium: 3.6 mmol/L (ref 3.5–5.1)
Sodium: 136 mmol/L (ref 135–145)

## 2022-10-13 LAB — CBC
HCT: 23.4 % — ABNORMAL LOW (ref 36.0–46.0)
Hemoglobin: 8.3 g/dL — ABNORMAL LOW (ref 12.0–15.0)
MCH: 33.6 pg (ref 26.0–34.0)
MCHC: 35.5 g/dL (ref 30.0–36.0)
MCV: 94.7 fL (ref 80.0–100.0)
Platelets: 337 10*3/uL (ref 150–400)
RBC: 2.47 MIL/uL — ABNORMAL LOW (ref 3.87–5.11)
RDW: 19.2 % — ABNORMAL HIGH (ref 11.5–15.5)
WBC: 9.7 10*3/uL (ref 4.0–10.5)
nRBC: 7.5 % — ABNORMAL HIGH (ref 0.0–0.2)

## 2022-10-13 NOTE — Plan of Care (Signed)
  Problem: Self-Care: Goal: Ability to incorporate actions that prevent/reduce pain crisis will improve Outcome: Progressing   Problem: Nutrition: Goal: Adequate nutrition will be maintained Outcome: Not Progressing

## 2022-10-13 NOTE — Progress Notes (Signed)
Patient tolerated 1 unit of blood well, no complaints or complications at this time.

## 2022-10-13 NOTE — Progress Notes (Signed)
SICKLE CELL SERVICE PROGRESS NOTE  Alicia Villegas VQQ:[id number] DOB: 2000/09/22 DOA: 10/12/2022 PCP: Massie Maroon, FNP  Assessment/Plan: Principal Problem:   Sickle cell pain crisis ([name]) Active Problems:   Symptomatic anemia  Sickle cell pain crisis: Patient's pain is still at 8 out of 10.  She is currently on Dilaudid PCA, Toradol, also on D5 half-normal.  Will continue with treatment. Anemia of chronic disease: Patient has symptomatic anemia.  Was to infuse a unit of Packed red blood cells.  Hemoglobin went from 6.3-8.3 today.  Continue to monitor H&H Leukocytosis: Continue to monitor due to sickle cell disease  Code Status: Full code Family Communication: Mother in the room Disposition Plan: Home  [facility]  Pager [phone]. If 7PM-7AM, please contact night-coverage.  10/13/2022, 2:06 PM  LOS: 1 day   Brief narrative: Alicia Villegas  is a 22 y.o. female with a medical history significant for sickle cell disease and anemia of chronic disease that presents to the emergency department with complaints of pain to chest, back, and abdomen for the past 2 days.  Patient states that she was awakened suddenly with pain to her chest that has been worsening in intensity.  She has not identified any inciting factors concerning crisis.  Patient states that pain has been unrelieved by her home medications.  Patient generally has very well-controlled sickle cell disease with infrequent pain crises.  Patient endorses some fatigue and weakness.  She also endorses some shortness of breath with exertion.  Patient denies headache, blurry vision, dizziness, urinary symptoms, nausea, vomiting, or diarrhea.  No sick contacts or recent travel.   Consultants: None  Procedures: None  Antibiotics: None  HPI/Subjective: Patient's pain is down to 7 out of 10.  She is resting comfortably.  No fever or chills.  She is electively opiate nave.  Receive her medications accordingly.  Objective: Vitals:    10/13/22 0146 10/13/22 0354 10/13/22 0551 10/13/22 1242  BP: (!) 96/50  98/60   Pulse: 71  (!) 59   Resp: 14 19 14 17   Temp: 98.1 F (36.7 C)  97.8 F (36.6 C)   TempSrc: Oral  Oral   SpO2: 100% 97% 100% 98%  Weight:      Height:       Weight change:   Intake/Output Summary (Last 24 hours) at 10/13/2022 1406 Last data filed at 10/12/2022 2243 Gross per 24 hour  Intake 371.25 ml  Output --  Net 371.25 ml    General: Alert, awake, oriented x3, in no acute distress.  HEENT: [place]/AT PEERL, EOMI Neck: Trachea midline,  no masses, no thyromegal,y no JVD, no carotid bruit OROPHARYNX:  Moist, No exudate/ erythema/lesions.  Heart: Regular rate and rhythm, without murmurs, rubs, gallops, PMI non-displaced, no heaves or thrills on palpation.  Lungs: Clear to auscultation, no wheezing or rhonchi noted. No increased vocal fremitus resonant to percussion  Abdomen: Soft, nontender, nondistended, positive bowel sounds, no masses no hepatosplenomegaly noted..  Neuro: No focal neurological deficits noted cranial nerves II through XII grossly intact. DTRs 2+ bilaterally upper and lower extremities. Strength 5 out of 5 in bilateral upper and lower extremities. Musculoskeletal: No warm swelling or erythema around joints, no spinal tenderness noted. Psychiatric: Patient alert and oriented x3, good insight and cognition, good recent to remote recall. Lymph node survey: No cervical axillary or inguinal lymphadenopathy noted.   Data Reviewed: Basic Metabolic Panel: Recent Labs  Lab 10/12/22 0517 10/13/22 0539  NA 136 136  K 3.7 3.6  CL  107 106  CO2 22 22  GLUCOSE 98 99  BUN 7 8  CREATININE <0.30* <0.30*  CALCIUM 9.0 8.9   Liver Function Tests: Recent Labs  Lab 10/12/22 0517  AST 41  ALT 16  ALKPHOS 77  BILITOT 2.4*  PROT 7.3  ALBUMIN 4.4   No results for input(s): "LIPASE", "AMYLASE" in the last 168 hours. No results for input(s): "AMMONIA" in the last 168 hours. CBC: Recent Labs   Lab 10/12/22 0517 10/13/22 0539  WBC 11.1* 9.7  NEUTROABS 5.3  --   HGB 6.3* 8.3*  HCT 17.3* 23.4*  MCV 94.5 94.7  PLT 296 337   Cardiac Enzymes: No results for input(s): "CKTOTAL", "CKMB", "CKMBINDEX", "TROPONINI" in the last 168 hours. BNP (last 3 results) No results for input(s): "BNP" in the last 8760 hours.  ProBNP (last 3 results) No results for input(s): "PROBNP" in the last 8760 hours.  CBG: No results for input(s): "GLUCAP" in the last 168 hours.  Recent Results (from the past 240 hour(s))  Culture, blood (Routine X 2) w Reflex to ID Panel     Status: None (Preliminary result)   Collection Time: 10/12/22  7:15 PM   Specimen: BLOOD LEFT ARM  Result Value Ref Range Status   Specimen Description   Final    BLOOD LEFT ARM Performed at [facility], [address] [place], [place] [address]    Special Requests   Final    BOTTLES DRAWN AEROBIC AND ANAEROBIC Blood Culture adequate volume Performed at [facility], [address], [place], [place] [address]    Culture   Final    NO GROWTH < 12 HOURS Performed at [facility], [address] [place], [place] [address]    Report Status PENDING  Incomplete  Culture, blood (Routine X 2) w Reflex to ID Panel     Status: None (Preliminary result)   Collection Time: 10/12/22  7:15 PM   Specimen: BLOOD LEFT ARM  Result Value Ref Range Status   Specimen Description   Final    BLOOD LEFT ARM Performed at [facility], [address], [place], [place] [address]    Special Requests   Final    BOTTLES DRAWN AEROBIC AND ANAEROBIC Blood Culture adequate volume Performed at [facility], [address], [place], [place] [address]    Culture   Final    NO GROWTH < 12 HOURS Performed at [facility], [address], [place], [place] [address]    Report Status PENDING  Incomplete     Studies: DG Chest Port 1 View  Result Date: 10/12/2022 CLINICAL DATA:  Sickle  cell related chest pain. EXAM: PORTABLE CHEST 1 VIEW COMPARISON:  PA and lateral 11/15/2021 FINDINGS: Mild cardiomegaly is again noted with no evidence of CHF. The lungs are clear. The mediastinum is normally outlined. Mild thoracic dextroscoliosis. Compare: Unchanged. IMPRESSION: No evidence of acute chest disease.  Mild cardiomegaly. Electronically Signed   By: Almira Bar M.D.   On: 10/12/2022 05:51    Scheduled Meds:  ketorolac  15 mg Intravenous Q6H   morphine   Intravenous Q4H   senna-docusate  1 tablet Oral BID   voxelotor  1,500 mg Oral QHS   Continuous Infusions:  dextrose 5 % and 0.45 % NaCl 50 mL/hr at 10/13/22 1145    Principal Problem:   Sickle cell pain crisis ([name]) Active Problems:   Symptomatic anemia

## 2022-10-14 DIAGNOSIS — D5700 Hb-SS disease with crisis, unspecified: Secondary | ICD-10-CM | POA: Diagnosis not present

## 2022-10-14 NOTE — Discharge Summary (Signed)
Physician Discharge Summary   Patient: Alicia Villegas MRN: [id number] DOB: [birth year]  Admit date:     10/12/2022  Discharge date: {dischdate:26783}  Discharge Physician: Lonia Blood   PCP: Massie Maroon, FNP   Recommendations at discharge:  {Tip this will not be part of the note when signed- Example include specific recommendations for outpatient follow-up, pending tests to follow-up on. (Optional):26781}  ***  Discharge Diagnoses: Principal Problem:   Sickle cell pain crisis ([name]) Active Problems:   Symptomatic anemia  Resolved Problems:   * No resolved hospital problems. [facility] Course: No notes on file  Assessment and Plan: No notes have been filed under this hospital service. Service: Hospitalist     {Tip this will not be part of the note when signed Body mass index is 19.66 kg/m. , ,  (Optional):26781}  {(NOTE) Pain control PDMP Statment (Optional):26782} Consultants: *** Procedures performed: ***  Disposition: {Plan; Disposition:26390} Diet recommendation:  Discharge Diet Orders (From admission, onward)     Start     Ordered   10/14/22 0000  Diet - low sodium heart healthy        10/14/22 1315           {Diet_Plan:26776} DISCHARGE MEDICATION: Allergies as of 10/14/2022   No Known Allergies      Medication List     TAKE these medications    hydroxyurea 100 mg/mL Susp Commonly known as: HYDREA Take 1,200 mg by mouth at bedtime.   ibuprofen 200 MG tablet Commonly known as: ADVIL Take 200-800 mg by mouth every 6 (six) hours as needed for mild pain or headache.   prochlorperazine 10 MG tablet Commonly known as: COMPAZINE Take 10 mg by mouth every 6 (six) hours as needed for nausea or vomiting ("for headaches").   sterile water for irrigation   voxelotor 500 MG Tabs tablet Commonly known as: OXBRYTA Take 1,500 mg by mouth at bedtime.        Discharge Exam: Filed Weights   10/12/22 0445  Weight: 47.2 kg   ***  Condition  at discharge: {DC Condition:26389}  The results of significant diagnostics from this hospitalization (including imaging, microbiology, ancillary and laboratory) are listed below for reference.   Imaging Studies: DG Chest Port 1 View  Result Date: 10/12/2022 CLINICAL DATA:  Sickle cell related chest pain. EXAM: PORTABLE CHEST 1 VIEW COMPARISON:  PA and lateral 11/15/2021 FINDINGS: Mild cardiomegaly is again noted with no evidence of CHF. The lungs are clear. The mediastinum is normally outlined. Mild thoracic dextroscoliosis. Compare: Unchanged. IMPRESSION: No evidence of acute chest disease.  Mild cardiomegaly. Electronically Signed   By: Almira Bar M.D.   On: 10/12/2022 05:51    Microbiology: Results for orders placed or performed during the hospital encounter of 10/12/22  Culture, blood (Routine X 2) w Reflex to ID Panel     Status: None (Preliminary result)   Collection Time: 10/12/22  7:15 PM   Specimen: BLOOD LEFT ARM  Result Value Ref Range Status   Specimen Description   Final    BLOOD LEFT ARM Performed at [name], [address], [place], [place] [address]    Special Requests   Final    BOTTLES DRAWN AEROBIC AND ANAEROBIC Blood Culture adequate volume Performed at [facility], [address] [place], [place] [address]    Culture   Final    NO GROWTH 2 DAYS Performed at [facility], [address], [place], [place] [address]  Report Status PENDING  Incomplete  Culture, blood (Routine X 2) w Reflex to ID Panel     Status: None (Preliminary result)   Collection Time: 10/12/22  7:15 PM   Specimen: BLOOD LEFT ARM  Result Value Ref Range Status   Specimen Description   Final    BLOOD LEFT ARM Performed at [facility], [address] [place], [place] [address]    Special Requests   Final    BOTTLES DRAWN AEROBIC AND ANAEROBIC Blood Culture adequate volume Performed at [facility], [address],  [place], [place] [address]    Culture   Final    NO GROWTH 2 DAYS Performed at [facility], [address], [place], [place] [address]    Report Status PENDING  Incomplete    Labs: CBC: Recent Labs  Lab 10/12/22 0517 10/13/22 0539  WBC 11.1* 9.7  NEUTROABS 5.3  --   HGB 6.3* 8.3*  HCT 17.3* 23.4*  MCV 94.5 94.7  PLT 296 337   Basic Metabolic Panel: Recent Labs  Lab 10/12/22 0517 10/13/22 0539  NA 136 136  K 3.7 3.6  CL 107 106  CO2 22 22  GLUCOSE 98 99  BUN 7 8  CREATININE <0.30* <0.30*  CALCIUM 9.0 8.9   Liver Function Tests: Recent Labs  Lab 10/12/22 0517  AST 41  ALT 16  ALKPHOS 77  BILITOT 2.4*  PROT 7.3  ALBUMIN 4.4   CBG: No results for input(s): "GLUCAP" in the last 168 hours.  Discharge time spent: {LESS THAN/GREATER YQIH:47425} 30 minutes.  SignedLonia Blood, MD [facility] 10/14/2022

## 2022-10-15 ENCOUNTER — Telehealth: Payer: Self-pay

## 2022-10-15 LAB — TYPE AND SCREEN
ABO/RH(D): AB POS
Antibody Screen: POSITIVE
Unit division: 0

## 2022-10-15 LAB — BPAM RBC
Blood Product Expiration Date: 202410112359
ISSUE DATE / TIME: 202409062026
Unit Type and Rh: 6200

## 2022-10-15 NOTE — Transitions of Care (Post Inpatient/ED Visit) (Signed)
   10/15/2022  Name: Alicia Villegas MRN: [id number] DOB: 01/22/2001  Today's TOC FU Call Status: Today's TOC FU Call Status:: Unsuccessful Call (1st Attempt) Unsuccessful Call (1st Attempt) Date: 10/15/22  Attempted to reach the patient regarding the most recent Inpatient/ED visit.  Follow Up Plan: No further outreach attempts will be made at this time. We have been unable to contact the patient.  Signature Alyse Low, RN, BA, [facility], CRRN [facility] Care Management Coordinator, Transition of Care Ph # [phone]

## 2022-10-15 NOTE — Hospital Course (Signed)
Alicia Villegas  is a 22 y.o. female with a medical history significant for sickle cell disease and anemia of chronic disease that presents to the emergency department with complaints of pain to chest, back, and abdomen for the past 2 days.  Patient states that she was awakened suddenly with pain to her chest that has been worsening in intensity.  She has not identified any inciting factors concerning crisis.  Patient states that pain has been unrelieved by her home medications.  Patient generally has very well-controlled sickle cell disease with infrequent pain crises.  Patient endorses some fatigue and weakness.  She also endorses some shortness of breath with exertion.  Patient denies headache, blurry vision, dizziness, urinary symptoms, nausea, vomiting, or diarrhea.  No sick contacts or recent travel.   Patient was admitted and treated with Dilaudid PCA and Toradol.  Has also symptomatic anemia with hemoglobin dropping to 6.3.  Patient transfused 1 unit of packed red blood cell.  Ultimately hemoglobin rose to 8.3.  Patient had leukocytosis which was also addressed.  Ultimately pain got better.  Patient got back to her baseline and wants to be discharged home.  She subsequently discharged home on home regimen to follow-up with PCP.

## 2022-10-17 LAB — CULTURE, BLOOD (ROUTINE X 2)
Culture: NO GROWTH
Culture: NO GROWTH
Special Requests: ADEQUATE
Special Requests: ADEQUATE

## 2022-10-23 ENCOUNTER — Ambulatory Visit: Payer: Medicaid Other | Admitting: Family Medicine

## 2022-10-23 VITALS — BP 106/66 | HR 88 | Temp 97.2°F | Wt 106.4 lb

## 2022-10-23 DIAGNOSIS — E559 Vitamin D deficiency, unspecified: Secondary | ICD-10-CM | POA: Diagnosis not present

## 2022-10-23 DIAGNOSIS — D571 Sickle-cell disease without crisis: Secondary | ICD-10-CM | POA: Diagnosis not present

## 2022-10-23 DIAGNOSIS — Z23 Encounter for immunization: Secondary | ICD-10-CM

## 2022-10-23 DIAGNOSIS — R519 Headache, unspecified: Secondary | ICD-10-CM | POA: Diagnosis not present

## 2022-10-23 DIAGNOSIS — Z09 Encounter for follow-up examination after completed treatment for conditions other than malignant neoplasm: Secondary | ICD-10-CM

## 2022-10-23 MED ORDER — ENDARI 5 G PO PACK: 5.0000 g | PACK | Freq: Every day | ORAL | 1 refills | Status: DC

## 2022-11-01 LAB — CMP14+CBC/D/PLT+FER+RETIC+V...
ALT: 9 IU/L (ref 0–32)
AST: 43 IU/L — ABNORMAL HIGH (ref 0–40)
Albumin: 4.9 g/dL (ref 4.0–5.0)
Alkaline Phosphatase: 103 IU/L (ref 42–106)
BUN/Creatinine Ratio: 18 (ref 9–23)
BUN: 10 mg/dL (ref 6–20)
Basophils Absolute: 0.1 10*3/uL (ref 0.0–0.2)
Basos: 1 %
Bilirubin Total: 2.6 mg/dL — ABNORMAL HIGH (ref 0.0–1.2)
Calcium: 9.9 mg/dL (ref 8.7–10.2)
Chloride: 106 mmol/L (ref 96–106)
Creatinine, Ser: 0.56 mg/dL — ABNORMAL LOW (ref 0.57–1.00)
EOS (ABSOLUTE): 0.1 10*3/uL (ref 0.0–0.4)
Eos: 1 %
Ferritin: 1398 ng/mL — ABNORMAL HIGH (ref 15–150)
Globulin, Total: 3.2 g/dL (ref 1.5–4.5)
Glucose: 87 mg/dL (ref 70–99)
Hematocrit: 21.3 % — ABNORMAL LOW (ref 34.0–46.6)
Hemoglobin: 7.4 g/dL — ABNORMAL LOW (ref 11.1–15.9)
Immature Grans (Abs): 0.1 10*3/uL (ref 0.0–0.1)
Immature Granulocytes: 1 %
Lymphocytes Absolute: 4.2 10*3/uL — ABNORMAL HIGH (ref 0.7–3.1)
Lymphs: 34 %
MCH: 33.8 pg — ABNORMAL HIGH (ref 26.6–33.0)
MCHC: 34.7 g/dL (ref 31.5–35.7)
MCV: 97 fL (ref 79–97)
Monocytes Absolute: 1.4 10*3/uL — ABNORMAL HIGH (ref 0.1–0.9)
Monocytes: 11 %
NRBC: 2 % — ABNORMAL HIGH (ref 0–0)
Neutrophils Absolute: 6.5 10*3/uL (ref 1.4–7.0)
Neutrophils: 52 %
Platelets: 352 10*3/uL (ref 150–450)
Potassium: 4.1 mmol/L (ref 3.5–5.2)
RBC: 2.19 x10E6/uL — CL (ref 3.77–5.28)
RDW: 17.9 % — ABNORMAL HIGH (ref 11.7–15.4)
Retic Ct Pct: 16.7 % — ABNORMAL HIGH (ref 0.6–2.6)
Sodium: 140 mmol/L (ref 134–144)
Total Protein: 8.1 g/dL (ref 6.0–8.5)
Vit D, 25-Hydroxy: 22.4 ng/mL — ABNORMAL LOW (ref 30.0–100.0)
WBC: 12.3 10*3/uL — ABNORMAL HIGH (ref 3.4–10.8)
eGFR: 134 mL/min/{1.73_m2} (ref >59)

## 2022-11-02 ENCOUNTER — Telehealth: Payer: Self-pay | Admitting: Family Medicine

## 2022-11-02 NOTE — Telephone Encounter (Signed)
Left message with Myles Rosenthal (caregiver) regarding Alicia Villegas requesting [facility] call the [facility]. Alicia Villegas was prescribed by Dr. Joseph Art and she has an appt with Dr. Joseph Art on 11/07/2022, so she will discuss it with him during this appt.   Patient will follow up with [place] Hollis after her appt with Dr. Joseph Art.

## 2022-11-05 ENCOUNTER — Encounter: Payer: Self-pay | Admitting: Family Medicine

## 2022-11-07 DIAGNOSIS — D571 Sickle-cell disease without crisis: Secondary | ICD-10-CM | POA: Diagnosis not present

## 2022-12-07 ENCOUNTER — Telehealth: Payer: Self-pay | Admitting: Family Medicine

## 2022-12-07 NOTE — Telephone Encounter (Signed)
[name] (pts sponsor) called stating that pt was suppose to have an ultrasound that akes a certain machine like a "hernia ultrasound." She needs this due to a surgery she had when she was younger. They think there is some scar tissue. Her Hematologist from [facility] (Dr. Abagail Kitchens this referral and stated that the only facility that has the needed equipment is in [place]. [name] was wondering if you would be able to do this referral for her and if she would be able to get it done somewhere here in [place]

## 2022-12-18 ENCOUNTER — Ambulatory Visit: Payer: Self-pay | Admitting: Nurse Practitioner

## 2022-12-19 ENCOUNTER — Ambulatory Visit (INDEPENDENT_AMBULATORY_CARE_PROVIDER_SITE_OTHER): Payer: Medicaid Other | Admitting: Nurse Practitioner

## 2022-12-19 ENCOUNTER — Encounter: Payer: Self-pay | Admitting: Nurse Practitioner

## 2022-12-19 ENCOUNTER — Telehealth: Payer: Self-pay

## 2022-12-19 VITALS — BP 97/53 | HR 90 | Temp 97.0°F | Wt 108.0 lb

## 2022-12-19 DIAGNOSIS — G43019 Migraine without aura, intractable, without status migrainosus: Secondary | ICD-10-CM | POA: Diagnosis not present

## 2022-12-19 DIAGNOSIS — E559 Vitamin D deficiency, unspecified: Secondary | ICD-10-CM

## 2022-12-19 DIAGNOSIS — D571 Sickle-cell disease without crisis: Secondary | ICD-10-CM

## 2022-12-19 DIAGNOSIS — R1033 Periumbilical pain: Secondary | ICD-10-CM | POA: Diagnosis not present

## 2022-12-19 MED ORDER — VITAMIN D3 25 MCG (1000 UT) PO CAPS: 1000.0000 [IU] | ORAL_CAPSULE | Freq: Every day | ORAL | 1 refills | Status: AC

## 2022-12-19 MED ORDER — FOLIC ACID 1 MG PO TABS: 1.0000 mg | ORAL_TABLET | Freq: Every day | ORAL | 1 refills | Status: AC

## 2022-12-19 NOTE — Assessment & Plan Note (Signed)
Vitamin D 1000 units daily refilled

## 2022-12-19 NOTE — Patient Instructions (Addendum)
Please take Tylenol 650 mg every 6 hours as needed for headache Okay to take compazine 10 mg daily as needed as needed for headache I have referred you to the neurology   1. Vitamin D deficiency  - Cholecalciferol (VITAMIN D3) 25 MCG (1000 UT) CAPS; Take 1 capsule (1,000 Units total) by mouth daily.  Dispense: 90 capsule; Refill: 1  2. Intractable migraine without aura and without status migrainosus  - Ambulatory referral to Neurology  3. Periumbilical abdominal pain  - US Abdomen Complete    It is important that you exercise regularly at least 30 minutes 5 times a week as tolerated  Think about what you will eat, plan ahead. Choose " clean, green, fresh or frozen" over canned, processed or packaged foods which are more sugary, salty and fatty. 70 to 75% of food eaten should be vegetables and fruit. Three meals at set times with snacks allowed between meals, but they must be fruit or vegetables. Aim to eat over a 12 hour period , example 7 am to 7 pm, and STOP after  your last meal of the day. Drink water,generally about 64 ounces per day, no other drink is as healthy. Fruit juice is best enjoyed in a healthy way, by EATING the fruit.  Thanks for choosing [facility] we consider it a privelige to serve you.

## 2022-12-19 NOTE — Telephone Encounter (Signed)
Called pt to advise No answer. LVM for pt to call back. KH

## 2022-12-19 NOTE — Telephone Encounter (Signed)
-----   Message from [place] sent at 12/19/2022  1:14 PM EST ----- Please let the patient know that there is no need for an ultrasound at this time.  I have reviewed her previous ultrasound and abdominal CT and there is no need to repeat 1.  Recent abdominal CT did not show anything concerning except for diverticulosis.  I have referred her to the GI specialist for her persistent intermittent abdominal pain. Thanks

## 2022-12-19 NOTE — Progress Notes (Signed)
Established Patient Office Visit  Subjective:  Patient ID: Alicia Villegas, female    DOB: 2000/03/19  Age: 22 y.o. MRN: [id number]  CC:  Chief Complaint  Patient presents with   Sickle Cell Anemia    HPI Alicia Villegas is a 22 y.o. 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  Patient presents for follow-up for her chronic medical conditions  She has been maintaining close follow-up with hematologist, takes hydroxyurea 1200 mg daily, folic acid 1 mg daily.  Takes Tylenol and ibuprofen as needed for pain.  Has been returning 5 mg daily ordered by [place] NP that the patient is not taking.  Currently denies fever, chest pain, shortness of breath  Patient complains of intermittent pain around her umbilical area, stated that she had a hernia surgery and splenic ectomy in the past.  No complaints of nausea, vomiting, diarrhea  Patient complains of chronic headache, she is established with neurology at [facility] but wants a referral to a neurologist in [place] area.  She has Compazine 10 mg daily as needed ordered for headache that she is not taking, was on propranolol in the past but not currently.  We discussed taking Tylenol as needed and following up with neurologist.    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   Migraine 03/27/2020   Sickle cell anemia ([name])    Sickle cell anemia ([name])    type SS   Sickle cell disease, type SS ([name]) 10/14/2011   Vitamin D deficiency 10/08/2019    Past Surgical History:  Procedure Laterality Date   CHOLECYSTECTOMY     DG GALL BLADDER     HERNIA  REPAIR     SPLENECTOMY     SPLENECTOMY, TOTAL      Family History  Family history unknown: Yes    Social History   Socioeconomic History   Marital status: Single    Spouse name: Not on file   Number of children: Not on file   Years of education: Not on file   Highest education level: Not on file  Occupational History   Not on file  Tobacco Use   Smoking status: Never   Smokeless tobacco: Never  Vaping Use   Vaping status: Never Used  Substance and Sexual Activity   Alcohol use: No   Drug use: No   Sexual activity: Never    Birth control/protection: Abstinence  Other Topics Concern   Not on file  Social History Narrative   ** Merged History Encounter **       ** Data from: 01/02/16 Enc Dept: [facility]   Family moved from [place] to [place] around 2008 (age 61).  Lives with parents & siblings age 43, 50, 72, 38, 54, 2.  No smokers in the house; No pets in the house.       ** Data from: 03/16/16 Enc Dept: [facility]   From [place], many siblings at home.   Social Determinants of Health   Financial Resource Strain: Not on file  Food Insecurity: No Food Insecurity (10/12/2022)   Hunger Vital Sign    Worried About Running Out of Food in the Last Year:  Never true    Ran Out of Food in the Last Year: Never true  Transportation Needs: No Transportation Needs (10/12/2022)   PRAPARE - Administrator, Civil Service (Medical): No    Lack of Transportation (Non-Medical): No  Recent Concern: Transportation Needs - Unmet Transportation Needs (08/25/2022)   Received from [employer] - Transportation    Lack of Transportation (Medical): Yes    Lack of Transportation (Non-Medical): No  Physical Activity: Not on file  Stress: Not on file  Social Connections: Not on file  Intimate Partner Violence: Not At Risk (10/12/2022)   Humiliation, Afraid, Rape, and Kick questionnaire    Fear of Current or Ex-Partner: No    Emotionally Abused: No     Physically Abused: No    Sexually Abused: No    Outpatient Medications Prior to Visit  Medication Sig Dispense Refill   hydroxyurea (HYDREA) 100 mg/mL SUSP Take by mouth.     ibuprofen (ADVIL) 200 MG tablet Take 200-800 mg by mouth every 6 (six) hours as needed for mild pain or headache.     prochlorperazine (COMPAZINE) 10 MG tablet Take 10 mg by mouth every 6 (six) hours as needed for nausea or vomiting ("for headaches").     Glutamine, Sickle Cell, (ENDARI) 5 g PACK Take 5 g by mouth daily. (Patient not taking: Reported on 12/19/2022) 180 each 1   Water For Irrigation, Sterile (STERILE WATER FOR IRRIGATION)  (Patient not taking: Reported on 07/24/2022)     voxelotor (OXBRYTA) 500 MG TABS tablet Take 1,500 mg by mouth at bedtime. (Patient not taking: Reported on 12/19/2022)     No facility-administered medications prior to visit.    No Known Allergies  ROS Review of Systems  Constitutional:  Negative for appetite change, chills, fatigue and fever.  HENT:  Negative for congestion, postnasal drip, rhinorrhea and sneezing.   Respiratory:  Negative for cough, shortness of breath and wheezing.   Cardiovascular:  Negative for chest pain, palpitations and leg swelling.  Gastrointestinal:  Positive for abdominal pain. Negative for constipation, nausea and vomiting.  Genitourinary:  Negative for difficulty urinating, dysuria, flank pain and frequency.  Musculoskeletal:  Negative for arthralgias, back pain, joint swelling and myalgias.  Skin:  Negative for color change, pallor, rash and wound.  Neurological:  Positive for headaches. Negative for dizziness, facial asymmetry, weakness and numbness.  Psychiatric/Behavioral:  Negative for behavioral problems, confusion, self-injury and suicidal ideas.       Objective:    Physical Exam Vitals and nursing note reviewed.  Constitutional:      General: She is not in acute distress.    Appearance: Normal appearance. She is not ill-appearing,  toxic-appearing or diaphoretic.  HENT:     Mouth/Throat:     Mouth: Mucous membranes are moist.     Pharynx: Oropharynx is clear. No oropharyngeal exudate or posterior oropharyngeal erythema.  Eyes:     General: Scleral icterus present.        Right eye: No discharge.        Left eye: No discharge.     Extraocular Movements: Extraocular movements intact.     Conjunctiva/sclera: Conjunctivae normal.  Cardiovascular:     Rate and Rhythm: Normal rate and regular rhythm.     Pulses: Normal pulses.     Heart sounds: Normal heart sounds. No murmur heard.    No friction rub. No gallop.  Pulmonary:     Effort: Pulmonary effort is normal. No respiratory  distress.     Breath sounds: Normal breath sounds. No stridor. No wheezing, rhonchi or rales.  Chest:     Chest wall: No tenderness.  Abdominal:     General: There is no distension.     Palpations: Abdomen is soft.     Tenderness: There is no abdominal tenderness. There is no right CVA tenderness, left CVA tenderness or guarding.  Musculoskeletal:        General: No swelling, tenderness, deformity or signs of injury.     Right lower leg: No edema.     Left lower leg: No edema.  Skin:    General: Skin is warm and dry.     Capillary Refill: Capillary refill takes less than 2 seconds.     Coloration: Skin is not jaundiced or pale.     Findings: No bruising, erythema or lesion.  Neurological:     Mental Status: She is alert and oriented to person, place, and time.     Motor: No weakness.     Coordination: Coordination normal.     Gait: Gait normal.  Psychiatric:        Mood and Affect: Mood normal.        Behavior: Behavior normal.        Thought Content: Thought content normal.        Judgment: Judgment normal.     BP (!) 97/53   Pulse 90   Temp (!) 97 F (36.1 C)   Wt 108 lb (49 kg)   SpO2 97%   BMI 20.41 kg/m  Wt Readings from Last 3 Encounters:  12/19/22 108 lb (49 kg)  10/23/22 106 lb 6.4 oz (48.3 kg)  10/12/22 104 lb  0.9 oz (47.2 kg)    No results found for: "TSH" Lab Results  Component Value Date   WBC 12.3 (H) 10/23/2022   HGB 7.4 (L) 10/23/2022   HCT 21.3 (L) 10/23/2022   MCV 97 10/23/2022   PLT 352 10/23/2022   Lab Results  Component Value Date   NA 140 10/23/2022   K 4.1 10/23/2022   CO2 CANCELED 10/23/2022   GLUCOSE 87 10/23/2022   BUN 10 10/23/2022   CREATININE 0.56 (L) 10/23/2022   BILITOT 2.6 (H) 10/23/2022   ALKPHOS 103 10/23/2022   AST 43 (H) 10/23/2022   ALT 9 10/23/2022   PROT 8.1 10/23/2022   ALBUMIN 4.9 10/23/2022   CALCIUM 9.9 10/23/2022   ANIONGAP 8 10/13/2022   EGFR 134 10/23/2022   No results found for: "CHOL" No results found for: "HDL" No results found for: "LDLCALC" No results found for: "TRIG" No results found for: "CHOLHDL" No results found for: "HGBA1C"    Assessment & Plan:   Problem List Items Addressed This Visit       Cardiovascular and Mediastinum   Migraine    Take Tylenol 650 mg every 6 hours as needed 10 mg every 6 hours as needed Patient referred to neurology      Relevant Orders   Ambulatory referral to Neurology     Other   Abdominal pain    Recent imaging study show below showed diverticulosis without diverticulitis Referred patients to GI for her persistent pain No need to repeat an imaging study at this time  FINDINGS: Lower chest: There is mild cardiomegaly, small pericardial effusion increased from the prior study.   The intraventricular blood pool is less dense than the myocardium consistent with anemia. Lung bases show scattered linear scar-like opacities without infiltrates.  Hepatobiliary: Gallbladder is absent as before, without biliary dilatation. The unenhanced liver is unremarkable.   Pancreas: Unremarkable without contrast. 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   Spleen: Surgically absent.    Adrenals/Urinary Tract: There is no adrenal mass. No focal abnormality is seen in the unenhanced kidneys. There is no urinary stone or obstruction. There is no bladder thickening.   Stomach/Bowel: The stomach and unopacified small bowel are unremarkable. The appendix is normal. The colon wall is normal thickness. There are scattered diverticula left colon without evidence of focal diverticulitis.   Vascular/Lymphatic: No significant vascular findings are present. No enlarged abdominal or pelvic lymph nodes.   Reproductive: The uterus is intact. Right adnexal area is unremarkable.   On the left, a 3 cm ovarian cystic lesion is noted with homogeneous attenuation and Hounsfield density of 18.   No follow-up imaging is recommended (reference: JACR 2020 Feb;17(2):248-254).   No other adnexal abnormality is seen.   Other: No abdominal wall hernia or abnormality. No abdominopelvic ascites.   Musculoskeletal: There are central endplate defects in the spine consistent with history of sickle cell disease. No acute or other significant osseous findings.   IMPRESSION: 1. No acute noncontrast CT findings in the abdomen or pelvis. 2. Diverticulosis without evidence of diverticulitis. 3. Mild cardiomegaly with small pericardial effusion. 4. Anemia. 5. Central endplate defects in the spine consistent with history of sickle cell disease. 6. 3 cm left ovarian simple-appearing cyst. No follow-up imaging recommended.      Relevant Orders   Ambulatory referral to Gastroenterology   Vitamin D deficiency - Primary    Vitamin D 1000 units daily refilled      Relevant Medications   Cholecalciferol (VITAMIN D3) 25 MCG (1000 UT) CAPS   Hb-SS disease without crisis ([name])    We discussed the need for good hydration, monitoring of hydration status, avoidance of heat, cold, stress, and infection triggers. We discussed the need to be adherent with taking Hydrea and other home medications. Patient  was reminded of the need to seek medical attention immediately if any symptom of bleeding, anemia, or infection occurs.   Continue hydroxyurea 1200 mg daily, folic acid 1 mg daily, vitamin D 1000 units daily Patient would like her CBC to be checked Encouraged to maintain close follow-up with hematology Continue ibuprofen and Tylenol as needed for pain      Relevant Medications   folic acid (FOLVITE) 1 MG tablet   Other Relevant Orders   CBC    Meds ordered this encounter  Medications   folic acid (FOLVITE) 1 MG tablet    Sig: Take 1 tablet (1 mg total) by mouth daily.    Dispense:  90 tablet    Refill:  1   Cholecalciferol (VITAMIN D3) 25 MCG (1000 UT) CAPS    Sig: Take 1 capsule (1,000 Units total) by mouth daily.    Dispense:  90 capsule    Refill:  1    Follow-up: Return in about 3 months (around 03/21/2023), or [facility], for labs next week.    Donell Beers, FNP

## 2022-12-19 NOTE — Assessment & Plan Note (Signed)
Recent imaging study show below showed diverticulosis without diverticulitis Referred patients to GI for her persistent pain No need to repeat an imaging study at this time  FINDINGS: Lower chest: There is mild cardiomegaly, small pericardial effusion increased from the prior study.   The intraventricular blood pool is less dense than the myocardium consistent with anemia. Lung bases show scattered linear scar-like opacities without infiltrates.   Hepatobiliary: Gallbladder is absent as before, without biliary dilatation. The unenhanced liver is unremarkable.   Pancreas: Unremarkable without contrast. 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   Spleen: Surgically absent.   Adrenals/Urinary Tract: There is no adrenal mass. No focal abnormality is seen in the unenhanced kidneys. There is no urinary stone or obstruction. There is no bladder thickening.   Stomach/Bowel: The stomach and unopacified small bowel are unremarkable. The appendix is normal. The colon wall is normal thickness. There are scattered diverticula left colon without evidence of focal diverticulitis.   Vascular/Lymphatic: No significant vascular findings are present. No enlarged abdominal or pelvic lymph nodes.   Reproductive: The uterus is intact. Right adnexal area is unremarkable.   On the left, a 3 cm ovarian cystic lesion is noted with homogeneous attenuation and Hounsfield density of 18.   No follow-up imaging is recommended (reference: JACR 2020 Feb;17(2):248-254).   No other adnexal abnormality is seen.   Other: No abdominal wall hernia or abnormality. No abdominopelvic ascites.   Musculoskeletal: There are central endplate defects in the spine consistent with history of sickle cell disease. No acute or other significant osseous findings.   IMPRESSION: 1. No acute noncontrast CT findings in the  abdomen or pelvis. 2. Diverticulosis without evidence of diverticulitis. 3. Mild cardiomegaly with small pericardial effusion. 4. Anemia. 5. Central endplate defects in the spine consistent with history of sickle cell disease. 6. 3 cm left ovarian simple-appearing cyst. No follow-up imaging recommended.

## 2022-12-19 NOTE — Assessment & Plan Note (Signed)
Take Tylenol 650 mg every 6 hours as needed 10 mg every 6 hours as needed Patient referred to neurology

## 2022-12-19 NOTE — Assessment & Plan Note (Signed)
We discussed the need for good hydration, monitoring of hydration status, avoidance of heat, cold, stress, and infection triggers. We discussed the need to be adherent with taking Hydrea and other home medications. Patient was reminded of the need to seek medical attention immediately if any symptom of bleeding, anemia, or infection occurs.   Continue hydroxyurea 1200 mg daily, folic acid 1 mg daily, vitamin D 1000 units daily Patient would like her CBC to be checked Encouraged to maintain close follow-up with hematology Continue ibuprofen and Tylenol as needed for pain

## 2022-12-28 ENCOUNTER — Other Ambulatory Visit: Payer: Medicaid Other

## 2022-12-28 DIAGNOSIS — D571 Sickle-cell disease without crisis: Secondary | ICD-10-CM | POA: Diagnosis not present

## 2022-12-29 LAB — CBC
Hematocrit: 21.8 % — ABNORMAL LOW (ref 34.0–46.6)
Hemoglobin: 7.5 g/dL — ABNORMAL LOW (ref 11.1–15.9)
MCH: 34.9 pg — ABNORMAL HIGH (ref 26.6–33.0)
MCHC: 34.4 g/dL (ref 31.5–35.7)
MCV: 101 fL — ABNORMAL HIGH (ref 79–97)
NRBC: 3 % — ABNORMAL HIGH (ref 0–0)
Platelets: 408 10*3/uL (ref 150–450)
RBC: 2.15 x10E6/uL — CL (ref 3.77–5.28)
RDW: 22.3 % — ABNORMAL HIGH (ref 11.7–15.4)
WBC: 10.3 10*3/uL (ref 3.4–10.8)

## 2023-01-09 ENCOUNTER — Telehealth (HOSPITAL_COMMUNITY): Payer: Self-pay

## 2023-01-09 ENCOUNTER — Other Ambulatory Visit: Payer: Self-pay

## 2023-01-09 ENCOUNTER — Encounter (HOSPITAL_COMMUNITY): Payer: Self-pay | Admitting: Family Medicine

## 2023-01-09 ENCOUNTER — Observation Stay (HOSPITAL_COMMUNITY)
Admission: AD | Admit: 2023-01-09 | Discharge: 2023-01-10 | Disposition: A | Discharge status: Home / Self Care | Payer: Medicaid Other | Source: Ambulatory Visit | Attending: Internal Medicine | Admitting: Internal Medicine

## 2023-01-09 DIAGNOSIS — D649 Anemia, unspecified: Secondary | ICD-10-CM | POA: Diagnosis not present

## 2023-01-09 DIAGNOSIS — D5700 Hb-SS disease with crisis, unspecified: Principal | ICD-10-CM | POA: Diagnosis present

## 2023-01-09 DIAGNOSIS — Z79899 Other long term (current) drug therapy: Secondary | ICD-10-CM | POA: Insufficient documentation

## 2023-01-09 LAB — CBC WITH DIFFERENTIAL/PLATELET
Abs Immature Granulocytes: 0.06 10*3/uL (ref 0.00–0.07)
Basophils Absolute: 0.1 10*3/uL (ref 0.0–0.1)
Basophils Relative: 1 %
Eosinophils Absolute: 0.2 10*3/uL (ref 0.0–0.5)
Eosinophils Relative: 2 %
HCT: 17.5 % — ABNORMAL LOW (ref 36.0–46.0)
Hemoglobin: 6.4 g/dL — CL (ref 12.0–15.0)
Immature Granulocytes: 1 %
Lymphocytes Relative: 43 %
Lymphs Abs: 4.1 10*3/uL — ABNORMAL HIGH (ref 0.7–4.0)
MCH: 33.9 pg (ref 26.0–34.0)
MCHC: 36.6 g/dL — ABNORMAL HIGH (ref 30.0–36.0)
MCV: 92.6 fL (ref 80.0–100.0)
Monocytes Absolute: 1 10*3/uL (ref 0.1–1.0)
Monocytes Relative: 11 %
Neutro Abs: 4.1 10*3/uL (ref 1.7–7.7)
Neutrophils Relative %: 42 %
Platelets: 302 10*3/uL (ref 150–400)
RBC: 1.89 MIL/uL — ABNORMAL LOW (ref 3.87–5.11)
RDW: 22.8 % — ABNORMAL HIGH (ref 11.5–15.5)
WBC: 9.5 10*3/uL (ref 4.0–10.5)
nRBC: 4.4 % — ABNORMAL HIGH (ref 0.0–0.2)

## 2023-01-09 LAB — COMPREHENSIVE METABOLIC PANEL
ALT: 15 U/L (ref 0–44)
AST: 33 U/L (ref 15–41)
Albumin: 4.5 g/dL (ref 3.5–5.0)
Alkaline Phosphatase: 68 U/L (ref 38–126)
Anion gap: 10 (ref 5–15)
BUN: 8 mg/dL (ref 6–20)
CO2: 18 mmol/L — ABNORMAL LOW (ref 22–32)
Calcium: 9.2 mg/dL (ref 8.9–10.3)
Chloride: 110 mmol/L (ref 98–111)
Creatinine, Ser: 0.37 mg/dL — ABNORMAL LOW (ref 0.44–1.00)
GFR, Estimated: >60 mL/min (ref >60)
Glucose, Bld: 78 mg/dL (ref 70–99)
Potassium: 3.6 mmol/L (ref 3.5–5.1)
Sodium: 138 mmol/L (ref 135–145)
Total Bilirubin: 2.7 mg/dL — ABNORMAL HIGH (ref <1.2)
Total Protein: 7.3 g/dL (ref 6.5–8.1)

## 2023-01-09 LAB — RETICULOCYTES
Immature Retic Fract: 35.3 % — ABNORMAL HIGH (ref 2.3–15.9)
RBC.: 1.8 MIL/uL — ABNORMAL LOW (ref 3.87–5.11)
Retic Count, Absolute: 144.6 10*3/uL (ref 19.0–186.0)
Retic Ct Pct: 8 % — ABNORMAL HIGH (ref 0.4–3.1)

## 2023-01-09 LAB — LACTATE DEHYDROGENASE: LDH: 544 U/L — ABNORMAL HIGH (ref 98–192)

## 2023-01-09 LAB — HCG, SERUM, QUALITATIVE: Preg, Serum: NEGATIVE

## 2023-01-09 LAB — PREPARE RBC (CROSSMATCH)

## 2023-01-09 MED ORDER — ACETAMINOPHEN 325 MG PO TABS
650.0000 mg | ORAL_TABLET | Freq: Once | ORAL | Status: AC
  Administered 2023-01-10: 650 mg via ORAL
  Filled 2023-01-09: qty 2

## 2023-01-09 MED ORDER — ENOXAPARIN SODIUM 40 MG/0.4ML IJ SOSY
40.0000 mg | PREFILLED_SYRINGE | INTRAMUSCULAR | Status: DC
  Filled 2023-01-09: qty 0.4

## 2023-01-09 MED ORDER — KETOROLAC TROMETHAMINE 15 MG/ML IJ SOLN
15.0000 mg | Freq: Four times a day (QID) | INTRAMUSCULAR | Status: DC
Start: 2023-01-09 — End: 2023-01-14
  Administered 2023-01-09: 15 mg via INTRAVENOUS
  Filled 2023-01-09 (×2): qty 1

## 2023-01-09 MED ORDER — ORAL CARE MOUTH RINSE: 15.0000 mL | OROMUCOSAL | Status: DC | PRN

## 2023-01-09 MED ORDER — ACETAMINOPHEN 500 MG PO TABS
1000.0000 mg | ORAL_TABLET | Freq: Once | ORAL | Status: AC
  Administered 2023-01-09: 1000 mg via ORAL
  Filled 2023-01-09: qty 2

## 2023-01-09 MED ORDER — SODIUM CHLORIDE 0.9% IV SOLUTION: Freq: Once | INTRAVENOUS | Status: DC

## 2023-01-09 MED ORDER — VITAMIN D 25 MCG (1000 UNIT) PO TABS
1000.0000 [IU] | ORAL_TABLET | Freq: Every day | ORAL | Status: DC
  Administered 2023-01-09 – 2023-01-10 (×2): 1000 [IU] via ORAL
  Filled 2023-01-09 (×2): qty 1

## 2023-01-09 MED ORDER — ONDANSETRON HCL 4 MG/2ML IJ SOLN
4.0000 mg | Freq: Four times a day (QID) | INTRAMUSCULAR | Status: DC | PRN
  Administered 2023-01-09: 4 mg via INTRAVENOUS
  Filled 2023-01-09: qty 2

## 2023-01-09 MED ORDER — SODIUM CHLORIDE 0.45 % IV SOLN: INTRAVENOUS | Status: AC

## 2023-01-09 MED ORDER — FOLIC ACID 1 MG PO TABS
1.0000 mg | ORAL_TABLET | Freq: Every day | ORAL | Status: DC
  Administered 2023-01-09 – 2023-01-10 (×2): 1 mg via ORAL
  Filled 2023-01-09 (×2): qty 1

## 2023-01-09 MED ORDER — DIPHENHYDRAMINE HCL 50 MG/ML IJ SOLN
25.0000 mg | Freq: Once | INTRAMUSCULAR | Status: AC
  Administered 2023-01-10: 25 mg via INTRAVENOUS
  Filled 2023-01-09: qty 1

## 2023-01-09 MED ORDER — SENNOSIDES-DOCUSATE SODIUM 8.6-50 MG PO TABS
1.0000 | ORAL_TABLET | Freq: Two times a day (BID) | ORAL | Status: DC
  Administered 2023-01-10: 1 via ORAL
  Filled 2023-01-09 (×2): qty 1

## 2023-01-09 MED ORDER — ACETAMINOPHEN 325 MG PO TABS
650.0000 mg | ORAL_TABLET | Freq: Four times a day (QID) | ORAL | Status: DC | PRN
  Administered 2023-01-10: 650 mg via ORAL
  Filled 2023-01-09: qty 2

## 2023-01-09 MED ORDER — POLYETHYLENE GLYCOL 3350 17 G PO PACK: 17.0000 g | PACK | Freq: Every day | ORAL | Status: DC | PRN

## 2023-01-09 MED ORDER — DIPHENHYDRAMINE HCL 25 MG PO CAPS: 25.0000 mg | ORAL_CAPSULE | Freq: Four times a day (QID) | ORAL | Status: DC | PRN

## 2023-01-09 MED ORDER — MORPHINE SULFATE (PF) 2 MG/ML IV SOLN: 2.0000 mg | INTRAVENOUS | Status: DC | PRN

## 2023-01-09 MED ORDER — KETOROLAC TROMETHAMINE 30 MG/ML IJ SOLN
15.0000 mg | Freq: Once | INTRAMUSCULAR | Status: AC
  Administered 2023-01-09: 15 mg via INTRAVENOUS
  Filled 2023-01-09: qty 1

## 2023-01-09 NOTE — H&P (Signed)
H&P  Patient Demographics:  Alicia Villegas, is a 22 y.o. female  MRN: [id number]   DOB - 2000/12/07  Admit Date - 01/09/2023  Outpatient Primary MD for the patient is Massie Maroon, FNP   HPI:   Alicia Villegas  is a 22 y.o. female with a medical history significant for sickle cell disease and anemia of chronic disease presents to sickle cell day infusion clinic for management of his sickle cell related pain.  Patient states that over the past several days she has been feeling fatigued.  She is still, "it has been difficult to walk without a little shortness of breath".  Patient is not having very much pain.  She is opiate nave and states that pain is mostly controlled on Tylenol and Excedrin.  Patient's last had these medications on last night.  She says that pain has been intermittent.  Patient denies any fever, chill, chest pain, presyncope/syncope, dizziness, or headache.  No sick contacts or recent travel.  No nausea, vomiting, diarrhea, or urinary symptoms. Sickle cell day infusion center course: Patient is not having very much pain, no PCA.  Hemoglobin found to be 5.4 g/dL.  Will transfuse 2 units PRBCs.   Review of systems:  In addition to the HPI above, patient reports No fever or chills No Headache, No changes with vision or hearing No problems swallowing food or liquids No chest pain, cough or shortness of breath No abdominal pain, No nausea or vomiting, Bowel movements are regular No blood in stool or urine No dysuria No new skin rashes or bruises No new joints pains-aches No new weakness, tingling, numbness in any extremity No recent weight gain or loss No polyuria, polydypsia or polyphagia No significant Mental Stressors  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    Migraine 03/27/2020   Sickle cell anemia ([name])    Sickle cell anemia ([name])    type SS   Sickle cell disease, type SS ([name]) 10/14/2011   Vitamin D deficiency 10/08/2019      Past Surgical History:  Procedure Laterality Date   CHOLECYSTECTOMY     DG GALL BLADDER     HERNIA REPAIR     SPLENECTOMY     SPLENECTOMY, TOTAL       Social History:   Social History   Tobacco Use   Smoking status: Never   Smokeless tobacco: Never  Substance Use Topics   Alcohol use: No     Lives - At home   Family History :   Family History  Family history unknown: Yes     Home Medications:   Prior to Admission medications   Medication Sig Start Date End Date Taking? Authorizing Provider  Cholecalciferol (VITAMIN D3) 25 MCG (1000 UT) CAPS Take 1 capsule (1,000 Units total) by mouth daily. 12/19/22   Donell Beers, FNP  folic acid (FOLVITE) 1 MG tablet Take 1 tablet (1 mg total) by mouth daily. 12/19/22   Paseda, Baird Kay, FNP  Glutamine, Sickle Cell, (ENDARI) 5 g PACK Take 5 g by mouth daily. Patient not taking: Reported on 12/19/2022 10/23/22   Massie Maroon, FNP  hydroxyurea (HYDREA) 100 mg/mL SUSP Take by mouth. 10/23/22   [provider]  ibuprofen (ADVIL) 200 MG tablet Take 200-800 mg by mouth every 6 (  six) hours as needed for mild pain or headache.    [provider]  prochlorperazine (COMPAZINE) 10 MG tablet Take 10 mg by mouth every 6 (six) hours as needed for nausea or vomiting ("for headaches").    [provider]  Water For Irrigation, Sterile (STERILE WATER FOR IRRIGATION)  02/27/22   [provider]     Allergies:   No Known Allergies   Physical Exam:   Vitals:   Vitals:   01/09/23 1400 01/09/23 1450  BP: (!) 90/44 (!) 96/52  Pulse: 85   Resp: 16   Temp: 98.6 F (37 C)   SpO2: 96%     Physical Exam: Constitutional: Patient appears well-developed and well-nourished. Not in obvious distress. HENT: Normocephalic,  atraumatic, External right and left ear normal. Oropharynx is clear and moist.  Eyes: Conjunctivae and EOM are normal. PERRLA, no scleral icterus. Neck: Normal ROM. Neck supple. No JVD. No tracheal deviation. No thyromegaly. CVS: RRR, S1/S2 +, no murmurs, no gallops, no carotid bruit.  Pulmonary: Effort and breath sounds normal, no stridor, rhonchi, wheezes, rales.  Abdominal: Soft. BS +, no distension, tenderness, rebound or guarding.  Musculoskeletal: Normal range of motion. No edema and no tenderness.  Lymphadenopathy: No lymphadenopathy noted, cervical, inguinal or axillary Neuro: Alert. Normal reflexes, muscle tone coordination. No cranial nerve deficit. Skin: Skin is warm and dry. No rash noted. Not diaphoretic. No erythema. No pallor. Psychiatric: Normal mood and affect. Behavior, judgment, thought content normal.   Data Review:   CBC Recent Labs  Lab 01/09/23 1310  WBC 9.5  HGB 6.4*  HCT 17.5*  PLT 302  MCV 92.6  MCH 33.9  MCHC 36.6*  RDW 22.8*  LYMPHSABS 4.1*  MONOABS 1.0  EOSABS 0.2  BASOSABS 0.1   ------------------------------------------------------------------------------------------------------------------  Chemistries  Recent Labs  Lab 01/09/23 1310  NA 138  K 3.6  CL 110  CO2 18*  GLUCOSE 78  BUN 8  CREATININE 0.37*  CALCIUM 9.2  AST 33  ALT 15  ALKPHOS 68  BILITOT 2.7*   ------------------------------------------------------------------------------------------------------------------ CrCl cannot be calculated (Unknown ideal weight.). ------------------------------------------------------------------------------------------------------------------ No results for input(s): "TSH", "T4TOTAL", "T3FREE", "THYROIDAB" in the last 72 hours.  Invalid input(s): "FREET3"  Coagulation profile No results for input(s): "INR", "PROTIME" in the last 168  hours. ------------------------------------------------------------------------------------------------------------------- No results for input(s): "DDIMER" in the last 72 hours. -------------------------------------------------------------------------------------------------------------------  Cardiac Enzymes No results for input(s): "CKMB", "TROPONINI", "MYOGLOBIN" in the last 168 hours.  Invalid input(s): "CK" ------------------------------------------------------------------------------------------------------------------ No results found for: "BNP"  ---------------------------------------------------------------------------------------------------------------  Urinalysis    Component Value Date/Time   COLORURINE AMBER (A) 06/04/2022 2212   APPEARANCEUR CLEAR 06/04/2022 2212   LABSPEC 1.006 06/04/2022 2212   PHURINE 5.0 06/04/2022 2212   GLUCOSEU NEGATIVE 06/04/2022 2212   HGBUR SMALL (A) 06/04/2022 2212   BILIRUBINUR NEGATIVE 06/04/2022 2212   BILIRUBINUR negative 04/24/2022 1537   KETONESUR 5 (A) 06/04/2022 2212   PROTEINUR NEGATIVE 06/04/2022 2212   UROBILINOGEN 1.0 04/24/2022 1537   UROBILINOGEN 1.0 01/13/2014 1612   NITRITE NEGATIVE 06/04/2022 2212   LEUKOCYTESUR NEGATIVE 06/04/2022 2212    ----------------------------------------------------------------------------------------------------------------   Imaging Results:    No results found.   Assessment & Plan:  Principal Problem:   Sickle cell crisis ([name])  Symptomatic anemia: Hemoglobin 6.2, which is below patient's baseline of 7-8 g/dL.  Patient will be transfused 2 units PRBCs as blood becomes available.  Sickle cell disease: Patient is not having very much pain at this time. Toradol 15 mg IV every  6 hours Tylenol 650 mg every 4 hours as needed Morphine 2 mg every 4 hours as needed for severe pain.   Continue folic acid 1 mg daily.  Hold hydroxyurea. Monitor vital sign s very closely, reevaluate pain  scale regularly, and supplemental oxygen as needed.   DVT Prophylaxis: Subcut Lovenox   AM Labs Ordered, also please review Full Orders  Family Communication: Admission, patient's condition and plan of care including tests being ordered have been discussed with the patient who indicate understanding and agree with the plan and Code Status.  Code Status: Full Code  Consults called: None    Admission status: Inpatient    Time spent in minutes : 30 minutes   Nolon Nations  APRN, MSN, FNP-C Patient Care [facility] [address] [place], [place] [address] [phone]  01/09/2023 at 2:55 PM

## 2023-01-09 NOTE — Progress Notes (Signed)
Pt admitted to day hospital today. On arrival, pt reports having no pain. Pt reports feeling fatigue and week. PIV placed by IV team. Pt received IV Toradol, IV Zofran, and hydrated with IV fluids via PIV. Pt also received PO Tylenol. Pts hgb resulted at 6.4. Due to pt needing blood transfusion, pt is being admitted to inpatient by [place] Hollis, FNP. Orders placed by provider for pt to receive 2 units PRBCS. Type and Screen sent and blue blood bank bracelet on pts wrist. Report given to Garfield Cornea, Charity fundraiser. PIV remains clean, dry, and intact at time of transfer, and is saline locked. Pt is alert, oriented, and ambulatory at transfer. Pt is transferred in wheelchair to [address] bed12 by RN.

## 2023-01-09 NOTE — Progress Notes (Signed)
  Critical Value: hgb 6.4  Time and Date notified: 01/09/2023 @ 1429  Provider notified: Arm[place] Hollis, FNP  Action Taken: Orders placed for pt to receive 2 units PRBCS and to be admitted to inpatient.

## 2023-01-09 NOTE — Plan of Care (Signed)

## 2023-01-09 NOTE — Telephone Encounter (Signed)
[name], patients sponsor called day hospital seeing if pt can come to clinic today. Per [name], pt is experiencing fatigue. [name] is unable to reach pt by phone, so RN is unable to complete triage form. Per [name], pt is having no pain at this time. [place] Hollis, FNP notified and per provider pt can come to the day hospital to have her labs checked today. [name] notified and says she will be pts transportation to the clinic.

## 2023-01-10 DIAGNOSIS — D5700 Hb-SS disease with crisis, unspecified: Secondary | ICD-10-CM | POA: Diagnosis not present

## 2023-01-10 LAB — CBC
HCT: 30.3 % — ABNORMAL LOW (ref 36.0–46.0)
Hemoglobin: 10.7 g/dL — ABNORMAL LOW (ref 12.0–15.0)
MCH: 32.4 pg (ref 26.0–34.0)
MCHC: 35.3 g/dL (ref 30.0–36.0)
MCV: 91.8 fL (ref 80.0–100.0)
Platelets: 367 10*3/uL (ref 150–400)
RBC: 3.3 MIL/uL — ABNORMAL LOW (ref 3.87–5.11)
RDW: 20.4 % — ABNORMAL HIGH (ref 11.5–15.5)
WBC: 9.8 10*3/uL (ref 4.0–10.5)
nRBC: 0.2 % (ref 0.0–0.2)

## 2023-01-10 NOTE — Progress Notes (Signed)
   01/10/23 1104  TOC Brief Assessment  Insurance and Status Reviewed  Patient has primary care physician Yes  Home environment has been reviewed Single family home  Prior level of function: Independent  Prior/Current Home Services No current home services  Social Determinants of Health Reivew SDOH reviewed no interventions necessary  Readmission risk has been reviewed Yes  Transition of care needs no transition of care needs at this time

## 2023-01-11 LAB — TYPE AND SCREEN
ABO/RH(D): AB POS
Antibody Screen: POSITIVE
Unit division: 0
Unit division: 0

## 2023-01-11 LAB — BPAM RBC
Blood Product Expiration Date: 202501012359
Blood Product Expiration Date: 202501042359
ISSUE DATE / TIME: 202412050119
ISSUE DATE / TIME: 202412050413
Unit Type and Rh: 5100
Unit Type and Rh: 5100

## 2023-01-11 NOTE — Discharge Summary (Addendum)
Physician Discharge Summary  Alicia Villegas HYQ:[id number] DOB: 2000/07/26 DOA: 01/09/2023  PCP: Massie Maroon, FNP  Admit date: 01/09/2023  Discharge date: 01/10/2023  Discharge Diagnoses:  Principal Problem:   Sickle cell crisis ([name]) Active Problems:   Sickle cell pain crisis [facility])   Discharge Condition: Stable  Disposition:   Follow-up Information     Massie Maroon, FNP Follow up.   Specialty: Family Medicine Contact information: [address] [place] [address] [phone]                Pt is discharged home in good condition and is to follow up with Massie Maroon, FNP this week to have labs evaluated. Alicia Villegas is instructed to increase activity slowly and balance with rest for the next few days, and use prescribed medication to complete treatment of pain  Diet: Regular Wt Readings from Last 3 Encounters:  01/09/23 49 kg  12/19/22 49 kg  10/23/22 48.3 kg    History of present illness:   Alicia Villegas  is a 22 y.o. female with a medical history significant for sickle cell disease and anemia of chronic disease presents to sickle cell day infusion clinic for management of his sickle cell related pain.  Patient states that over the past several days she has been feeling fatigued.  She is still, "it has been difficult to walk without a little shortness of breath".  Patient is not having very much pain.  She is opiate nave and states that pain is mostly controlled on Tylenol and Excedrin.  Patient's last had these medications on last night.  She says that pain has been intermittent.  Patient denies any fever, chill, chest pain, presyncope/syncope, dizziness, or headache.  No sick contacts or recent travel.  No nausea, vomiting, diarrhea, or urinary symptoms. Sickle cell day infusion center course: Patient is not having very much pain, no PCA.  Hemoglobin found to be 5.4 g/dL.  Will transfuse 2 units PRBCs.  Hospital Course:  Patient was  admitted for symptomatic anemia in the setting of sickle cell pain.  On admission, patient's hemoglobin was 6.4, which is below her baseline.  She presented with fatigue and some shortness of breath.  Patient also reported intermittent pain, that was controlled by home analgesics.  During admission, she was transfused 2 units PRBCs without complication.  Prior to discharge, hemoglobin improved to 10.7.  Patient states that fatigue and shortness of breath have improved.  She is not having any pain at this time.  Patient was therefore discharged home today in a hemodynamically stable condition.   Alicia Villegas will follow-up with PCP within 1 week of this discharge. Alicia Villegas was counseled extensively about nonpharmacologic means of pain management, patient verbalized understanding and was appreciative of  the care received during this admission.   We discussed the need for good hydration, monitoring of hydration status, avoidance of heat, cold, stress, and infection triggers. We discussed the need to be adherent with taking Hydrea and other home medications. Patient was reminded of the need to seek medical attention immediately if any symptom of bleeding, anemia, or infection occurs.  Discharge Exam: Vitals:   01/10/23 0615 01/10/23 1000  BP: 96/70 101/62  Pulse: 60 80  Resp: 16 16  Temp: 98.1 F (36.7 C) 98.8 F (37.1 C)  SpO2: 94% 95%   Vitals:   01/10/23 0418 01/10/23 0433 01/10/23 0615 01/10/23 1000  BP: (!) 97/58 (!) 96/54 96/70 101/62  Pulse: 71 67 60 80  Resp: 16 16 16 16   Temp: 98.1 F (36.7 C) 98.3 F (36.8 C) 98.1 F (36.7 C) 98.8 F (37.1 C)  TempSrc: Oral Oral Oral Oral  SpO2: 95% 95% 94% 95%  Weight:      Height:        General appearance : Awake, alert, not in any distress. Speech Clear. Not toxic looking HEENT: Atraumatic and Normocephalic, pupils equally reactive to light and accomodation Neck: Supple, no JVD. No cervical lymphadenopathy.  Chest: Good air entry bilaterally,  no added sounds  CVS: S1 S2 regular, no murmurs.  Abdomen: Bowel sounds present, Non tender and not distended with no gaurding, rigidity or rebound. Extremities: B/L Lower Ext shows no edema, both legs are warm to touch Neurology: Awake alert, and oriented X 3, CN II-XII intact, Non focal Skin: No Rash  Discharge Instructions  Discharge Instructions     Discharge patient   Complete by: As directed    Discharge disposition: 01-Home or Self Care   Discharge patient date: 01/10/2023      Allergies as of 01/10/2023   No Known Allergies      Medication List     TAKE these medications    acetaminophen 500 MG tablet Commonly known as: TYLENOL Take 500-1,000 mg by mouth every 6 (six) hours as needed for mild pain (pain score 1-3) or moderate pain (pain score 4-6).   folic acid 1 MG tablet Commonly known as: FOLVITE Take 1 tablet (1 mg total) by mouth daily.   hydroxyurea 100 mg/mL Susp Commonly known as: HYDREA Take by mouth.   ibuprofen 200 MG tablet Commonly known as: ADVIL Take 200-800 mg by mouth every 6 (six) hours as needed for mild pain or headache.   Vitamin D3 25 MCG (1000 UT) Caps Take 1 capsule (1,000 Units total) by mouth daily.        The results of significant diagnostics from this hospitalization (including imaging, microbiology, ancillary and laboratory) are listed below for reference.    Significant Diagnostic Studies: No results found.  Microbiology: No results found for this or any previous visit (from the past 240 hour(s)).   Labs: Basic Metabolic Panel: Recent Labs  Lab 01/09/23 1310  NA 138  K 3.6  CL 110  CO2 18*  GLUCOSE 78  BUN 8  CREATININE 0.37*  CALCIUM 9.2   Liver Function Tests: Recent Labs  Lab 01/09/23 1310  AST 33  ALT 15  ALKPHOS 68  BILITOT 2.7*  PROT 7.3  ALBUMIN 4.5   No results for input(s): "LIPASE", "AMYLASE" in the last 168 hours. No results for input(s): "AMMONIA" in the last 168 hours. CBC: Recent  Labs  Lab 01/09/23 1310 01/10/23 1055  WBC 9.5 9.8  NEUTROABS 4.1  --   HGB 6.4* 10.7*  HCT 17.5* 30.3*  MCV 92.6 91.8  PLT 302 367   Cardiac Enzymes: No results for input(s): "CKTOTAL", "CKMB", "CKMBINDEX", "TROPONINI" in the last 168 hours. BNP: Invalid input(s): "POCBNP" CBG: No results for input(s): "GLUCAP" in the last 168 hours.  Time coordinating discharge: 30 minutes  Signed: Nolon Nations  APRN, MSN, FNP-C Patient Care [facility] [address] [place], [place] [address] [phone]    [facility] 01/11/2023, 6:16 AM   Evaluation and management procedures were performed by the Advanced Practitioner under my supervision and collaboration. I have reviewed the Advanced Practitioner's note and chart, and I agree with the management and plan.   Jeanann Lewandowsky,  MD, MHA, CPE, Sherle Poe [facility] Patient [facility] [place], [place] [phone] 01/11/2023, 6:27 PM

## 2023-01-22 ENCOUNTER — Ambulatory Visit: Payer: Self-pay | Admitting: Family Medicine

## 2023-02-18 ENCOUNTER — Emergency Department (HOSPITAL_COMMUNITY): Payer: Medicaid Other

## 2023-02-18 ENCOUNTER — Encounter (HOSPITAL_COMMUNITY): Payer: Self-pay | Admitting: Emergency Medicine

## 2023-02-18 ENCOUNTER — Other Ambulatory Visit: Payer: Self-pay

## 2023-02-18 ENCOUNTER — Inpatient Hospital Stay (HOSPITAL_COMMUNITY)
Admission: EM | Admit: 2023-02-18 | Discharge: 2023-02-20 | DRG: 812 | Disposition: A | Discharge status: Home / Self Care | Payer: Medicaid Other | Attending: Internal Medicine | Admitting: Internal Medicine

## 2023-02-18 DIAGNOSIS — D75839 Thrombocytosis, unspecified: Secondary | ICD-10-CM | POA: Diagnosis present

## 2023-02-18 DIAGNOSIS — E559 Vitamin D deficiency, unspecified: Secondary | ICD-10-CM | POA: Diagnosis present

## 2023-02-18 DIAGNOSIS — D649 Anemia, unspecified: Secondary | ICD-10-CM | POA: Diagnosis not present

## 2023-02-18 DIAGNOSIS — Z9081 Acquired absence of spleen: Secondary | ICD-10-CM | POA: Diagnosis not present

## 2023-02-18 DIAGNOSIS — D5700 Hb-SS disease with crisis, unspecified: Principal | ICD-10-CM | POA: Diagnosis present

## 2023-02-18 DIAGNOSIS — Z79899 Other long term (current) drug therapy: Secondary | ICD-10-CM

## 2023-02-18 DIAGNOSIS — E871 Hypo-osmolality and hyponatremia: Secondary | ICD-10-CM | POA: Diagnosis present

## 2023-02-18 DIAGNOSIS — R0789 Other chest pain: Secondary | ICD-10-CM | POA: Diagnosis not present

## 2023-02-18 DIAGNOSIS — R6252 Short stature (child): Secondary | ICD-10-CM

## 2023-02-18 DIAGNOSIS — R9431 Abnormal electrocardiogram [ECG] [EKG]: Secondary | ICD-10-CM | POA: Diagnosis not present

## 2023-02-18 LAB — CBC
HCT: 18.1 % — ABNORMAL LOW (ref 36.0–46.0)
Hemoglobin: 6.6 g/dL — CL (ref 12.0–15.0)
MCH: 34.7 pg — ABNORMAL HIGH (ref 26.0–34.0)
MCHC: 36.5 g/dL — ABNORMAL HIGH (ref 30.0–36.0)
MCV: 95.3 fL (ref 80.0–100.0)
Platelets: 463 10*3/uL — ABNORMAL HIGH (ref 150–400)
RBC: 1.9 MIL/uL — ABNORMAL LOW (ref 3.87–5.11)
RDW: 22.6 % — ABNORMAL HIGH (ref 11.5–15.5)
WBC: 13.2 10*3/uL — ABNORMAL HIGH (ref 4.0–10.5)
nRBC: 3.8 % — ABNORMAL HIGH (ref 0.0–0.2)

## 2023-02-18 LAB — CBC WITH DIFFERENTIAL/PLATELET
Abs Immature Granulocytes: 0.56 10*3/uL — ABNORMAL HIGH (ref 0.00–0.07)
Basophils Absolute: 0.1 10*3/uL (ref 0.0–0.1)
Basophils Relative: 1 %
Eosinophils Absolute: 0.2 10*3/uL (ref 0.0–0.5)
Eosinophils Relative: 1 %
HCT: 17.2 % — ABNORMAL LOW (ref 36.0–46.0)
Hemoglobin: 6.4 g/dL — CL (ref 12.0–15.0)
Immature Granulocytes: 3 %
Lymphocytes Relative: 31 %
Lymphs Abs: 6.1 10*3/uL — ABNORMAL HIGH (ref 0.7–4.0)
MCH: 35.4 pg — ABNORMAL HIGH (ref 26.0–34.0)
MCHC: 37.2 g/dL — ABNORMAL HIGH (ref 30.0–36.0)
MCV: 95 fL (ref 80.0–100.0)
Monocytes Absolute: 1.5 10*3/uL — ABNORMAL HIGH (ref 0.1–1.0)
Monocytes Relative: 7 %
Neutro Abs: 11.7 10*3/uL — ABNORMAL HIGH (ref 1.7–7.7)
Neutrophils Relative %: 57 %
Platelets: 424 10*3/uL — ABNORMAL HIGH (ref 150–400)
RBC: 1.81 MIL/uL — ABNORMAL LOW (ref 3.87–5.11)
RDW: 23.3 % — ABNORMAL HIGH (ref 11.5–15.5)
WBC: 20.1 10*3/uL — ABNORMAL HIGH (ref 4.0–10.5)
nRBC: 1.9 % — ABNORMAL HIGH (ref 0.0–0.2)

## 2023-02-18 LAB — RETICULOCYTES
Immature Retic Fract: 39.2 % — ABNORMAL HIGH (ref 2.3–15.9)
RBC.: 1.81 MIL/uL — ABNORMAL LOW (ref 3.87–5.11)
Retic Count, Absolute: 18.9 10*3/uL — ABNORMAL LOW (ref 19.0–186.0)
Retic Ct Pct: 17.1 % — ABNORMAL HIGH (ref 0.4–3.1)

## 2023-02-18 LAB — COMPREHENSIVE METABOLIC PANEL
ALT: 13 U/L (ref 0–44)
AST: 45 U/L — ABNORMAL HIGH (ref 15–41)
Albumin: 4.4 g/dL (ref 3.5–5.0)
Alkaline Phosphatase: 77 U/L (ref 38–126)
Anion gap: 10 (ref 5–15)
BUN: 8 mg/dL (ref 6–20)
CO2: 18 mmol/L — ABNORMAL LOW (ref 22–32)
Calcium: 9.1 mg/dL (ref 8.9–10.3)
Chloride: 109 mmol/L (ref 98–111)
Creatinine, Ser: <0.3 mg/dL — ABNORMAL LOW (ref 0.44–1.00)
Glucose, Bld: 94 mg/dL (ref 70–99)
Potassium: 4 mmol/L (ref 3.5–5.1)
Sodium: 137 mmol/L (ref 135–145)
Total Bilirubin: 1.9 mg/dL — ABNORMAL HIGH (ref 0.0–1.2)
Total Protein: 7.7 g/dL (ref 6.5–8.1)

## 2023-02-18 LAB — CREATININE, SERUM
Creatinine, Ser: 0.34 mg/dL — ABNORMAL LOW (ref 0.44–1.00)
GFR, Estimated: >60 mL/min (ref >60)

## 2023-02-18 LAB — LIPASE, BLOOD: Lipase: 41 U/L (ref 11–51)

## 2023-02-18 LAB — PREPARE RBC (CROSSMATCH)

## 2023-02-18 LAB — HCG, SERUM, QUALITATIVE: Preg, Serum: NEGATIVE

## 2023-02-18 MED ORDER — IBUPROFEN 200 MG PO TABS
400.0000 mg | ORAL_TABLET | Freq: Four times a day (QID) | ORAL | Status: DC | PRN
  Administered 2023-02-19: 400 mg via ORAL
  Filled 2023-02-18: qty 2

## 2023-02-18 MED ORDER — ONDANSETRON HCL 4 MG/2ML IJ SOLN
4.0000 mg | Freq: Once | INTRAMUSCULAR | Status: AC
  Administered 2023-02-18: 4 mg via INTRAVENOUS
  Filled 2023-02-18: qty 2

## 2023-02-18 MED ORDER — SODIUM CHLORIDE 0.45 % IV SOLN: INTRAVENOUS | Status: AC

## 2023-02-18 MED ORDER — ONDANSETRON HCL 4 MG/2ML IJ SOLN
4.0000 mg | INTRAMUSCULAR | Status: DC | PRN
  Administered 2023-02-18: 4 mg via INTRAVENOUS
  Filled 2023-02-18: qty 2

## 2023-02-18 MED ORDER — HYDROMORPHONE 1 MG/ML IV SOLN: INTRAVENOUS | Status: DC

## 2023-02-18 MED ORDER — ACETAMINOPHEN 325 MG PO TABS
650.0000 mg | ORAL_TABLET | Freq: Four times a day (QID) | ORAL | Status: DC | PRN
  Administered 2023-02-19 – 2023-02-20 (×2): 650 mg via ORAL
  Filled 2023-02-18 (×2): qty 2

## 2023-02-18 MED ORDER — DIPHENHYDRAMINE HCL 50 MG/ML IJ SOLN
12.5000 mg | Freq: Four times a day (QID) | INTRAMUSCULAR | Status: DC | PRN
  Administered 2023-02-20 (×2): 12.5 mg via INTRAVENOUS
  Filled 2023-02-18 (×2): qty 1

## 2023-02-18 MED ORDER — SODIUM CHLORIDE 0.9% FLUSH: 9.0000 mL | INTRAVENOUS | Status: DC | PRN

## 2023-02-18 MED ORDER — MORPHINE SULFATE (PF) 4 MG/ML IV SOLN
6.0000 mg | INTRAVENOUS | Status: AC
  Administered 2023-02-18: 6 mg via INTRAVENOUS
  Filled 2023-02-18: qty 2

## 2023-02-18 MED ORDER — KETOROLAC TROMETHAMINE 15 MG/ML IJ SOLN
15.0000 mg | Freq: Four times a day (QID) | INTRAMUSCULAR | Status: DC
  Administered 2023-02-18: 15 mg via INTRAVENOUS
  Filled 2023-02-18 (×4): qty 1

## 2023-02-18 MED ORDER — ENOXAPARIN SODIUM 40 MG/0.4ML IJ SOSY
40.0000 mg | PREFILLED_SYRINGE | INTRAMUSCULAR | Status: DC
  Filled 2023-02-18: qty 0.4

## 2023-02-18 MED ORDER — HYDROMORPHONE HCL 1 MG/ML IJ SOLN
0.5000 mg | Freq: Once | INTRAMUSCULAR | Status: AC
  Administered 2023-02-18: 0.5 mg via SUBCUTANEOUS
  Filled 2023-02-18: qty 1

## 2023-02-18 MED ORDER — DEXTROSE-SODIUM CHLORIDE 5-0.45 % IV SOLN: INTRAVENOUS | Status: AC

## 2023-02-18 MED ORDER — DIPHENHYDRAMINE HCL 25 MG PO CAPS
25.0000 mg | ORAL_CAPSULE | ORAL | Status: DC | PRN
  Administered 2023-02-18: 25 mg via ORAL
  Filled 2023-02-18: qty 1

## 2023-02-18 MED ORDER — ONDANSETRON HCL 4 MG/2ML IJ SOLN: 4.0000 mg | Freq: Four times a day (QID) | INTRAMUSCULAR | Status: DC | PRN

## 2023-02-18 MED ORDER — SENNOSIDES-DOCUSATE SODIUM 8.6-50 MG PO TABS
1.0000 | ORAL_TABLET | Freq: Two times a day (BID) | ORAL | Status: DC
  Filled 2023-02-18 (×3): qty 1

## 2023-02-18 MED ORDER — MORPHINE SULFATE (PF) 4 MG/ML IV SOLN
8.0000 mg | INTRAVENOUS | Status: AC
  Administered 2023-02-18: 8 mg via INTRAVENOUS
  Filled 2023-02-18: qty 2

## 2023-02-18 MED ORDER — SODIUM CHLORIDE 0.9% IV SOLUTION: Freq: Once | INTRAVENOUS | Status: DC

## 2023-02-18 MED ORDER — POLYETHYLENE GLYCOL 3350 17 G PO PACK: 17.0000 g | PACK | Freq: Every day | ORAL | Status: DC | PRN

## 2023-02-18 MED ORDER — NALOXONE HCL 0.4 MG/ML IJ SOLN
0.4000 mg | INTRAMUSCULAR | Status: DC | PRN
Start: 2023-02-18 — End: 2023-02-20

## 2023-02-18 MED ORDER — DIPHENHYDRAMINE HCL 12.5 MG/5ML PO ELIX
12.5000 mg | ORAL_SOLUTION | Freq: Four times a day (QID) | ORAL | Status: DC | PRN
  Filled 2023-02-18: qty 5

## 2023-02-18 NOTE — ED Provider Triage Note (Signed)
 Emergency Medicine Provider Triage Evaluation Note  Alicia Villegas , a 23 y.o. female  was evaluated in triage.  Pt complains of sickle cell crisis. Started at 10p. Pain in back and legs. Has intermittent chest pain.  Review of Systems  Positive: Back pain, leg pain, chest pain Negative: fever  Physical Exam  There were no vitals taken for this visit. Gen:   Awake, looks uncomfortable Resp:  Normal effort    MSK:   Moves extremities without difficulty    Medical Decision Making  Medically screening exam initiated at 12:54 AM.  Appropriate orders placed.  Alicia Villegas was informed that the remainder of the evaluation will be completed by another provider, this initial triage assessment does not replace that evaluation, and the importance of remaining in the ED until their evaluation is complete.     Griselda Norris, MD 02/18/23 [phone]

## 2023-02-18 NOTE — ED Triage Notes (Signed)
 Pt here from home with c/o sickle cell pain , legs and back , pt states that she does not have meds for pain at home

## 2023-02-18 NOTE — ED Notes (Signed)
 NAD, calm, prone, listening to music on her phone, prefers lights out. Consent for blood transfusion signed. Type and screen sent.

## 2023-02-18 NOTE — Plan of Care (Signed)
  Problem: Education: Goal: Knowledge of General Education information will improve Description: Including pain rating scale, medication(s)/side effects and non-pharmacologic comfort measures Outcome: Progressing   Problem: Health Behavior/Discharge Planning: Goal: Ability to manage health-related needs will improve Outcome: Progressing   Problem: Clinical Measurements: Goal: Ability to maintain clinical measurements within normal limits will improve Outcome: Progressing Goal: Will remain free from infection Outcome: Progressing Goal: Diagnostic test results will improve Outcome: Progressing Goal: Respiratory complications will improve Outcome: Progressing Goal: Cardiovascular complication will be avoided Outcome: Progressing   Problem: Activity: Goal: Risk for activity intolerance will decrease Outcome: Progressing   Problem: Nutrition: Goal: Adequate nutrition will be maintained Outcome: Progressing   Problem: Skin Integrity: Goal: Risk for impaired skin integrity will decrease Outcome: Progressing   Problem: Fluid Volume: Goal: Ability to maintain a balanced intake and output will improve Outcome: Progressing   Problem: Sensory: Goal: Pain level will decrease with appropriate interventions Outcome: Progressing

## 2023-02-18 NOTE — ED Notes (Signed)
 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

## 2023-02-18 NOTE — H&P (Signed)
 History and Physical    Patient: Alicia Villegas FMW:[id number] DOB: May 12, 2000 DOA: 02/18/2023 DOS: the patient was seen and examined on 02/18/2023 PCP: Tilford Bertram HERO, FNP  Patient coming from: Home  Chief Complaint:  Chief Complaint  Patient presents with   Sickle Cell Pain Crisis   HPI: Alicia Villegas is a 23 y.o. female with medical history significant of cell disease, chronic disease, vitamin D  deficiency, consistent with her sickle cell crisis.  Patient was treated with multiple doses of IV Dilaudid .  She responded to the pain medicine well however hemoglobin was noted to be fall way below her baseline.  Her last hemoglobin was 10.7 in December but now 6.4.  Also white count is up to 20,000 and platelets 424.  As a result patient is admitted with symptomatic anemia and sickle cell crisis.  Review of Systems: As mentioned in the history of present illness. All other systems reviewed and are negative. 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 She has never used smokeless tobacco. She reports that she does not drink alcohol and does not use drugs.  No Known Allergies  Family History  Family history unknown: Yes    Prior to Admission medications   Medication Sig Start Date End Date Taking? Authorizing Provider  acetaminophen  (TYLENOL ) 500 MG tablet Take 500-1,000 mg by mouth every 6 (six) hours as needed for mild pain (pain score 1-3) or moderate  pain (pain score 4-6).    [provider]  Cholecalciferol  (VITAMIN D3) 25 MCG (1000 UT) CAPS Take 1 capsule (1,000 Units total) by mouth daily. 12/19/22   Paseda, Folashade R, FNP  folic acid  (FOLVITE ) 1 MG tablet Take 1 tablet (1 mg total) by mouth daily. 12/19/22   Paseda, Folashade R, FNP  hydroxyurea  (HYDREA ) 100 mg/mL SUSP Take by mouth. 10/23/22   [provider]  ibuprofen  (ADVIL ) 200 MG tablet Take 200-800 mg by mouth every 6 (six) hours as needed for mild pain or headache.    [provider]    Physical Exam: Vitals:   02/18/23 1046 02/18/23 1100 02/18/23 1130 02/18/23 1200  BP: (!) 95/59 100/63 (!) 99/52 102/61  Pulse: 92 92  86  Resp: 18 19 18 14   Temp:      TempSrc:      SpO2: 96% 97% 95% 96%  Weight:      Height:       Constitutional: Small stature ,NAD, calm, comfortable Eyes: PERRL, lids and conjunctivae normal ENMT: Mucous membranes are moist. Posterior pharynx clear of any exudate or lesions.Normal dentition.  Neck: normal, supple, no masses, no thyromegaly Respiratory: clear to auscultation bilaterally, no wheezing, no crackles. Normal respiratory effort. No accessory muscle use.  Cardiovascular: Regular rate and rhythm, no murmurs / rubs / gallops. No extremity edema. 2+ pedal pulses. No carotid bruits.  Abdomen: no tenderness, no  masses palpated. No hepatosplenomegaly. Bowel sounds positive.  Musculoskeletal: Good range of motion, no joint swelling or tenderness, Skin: no rashes, lesions, ulcers. No induration Neurologic: CN 2-12 grossly intact. Sensation intact, DTR normal. Strength 5/5 in all 4.  Psychiatric: Normal judgment and insight. Alert and oriented x 3. Normal mood  Data Reviewed:  Hemoglobin is 6.4, white count 20.1, platelets 424, reticulocyte count percent is 17.1 reticulocyte count absolute 18.9.  Chemistry largely within normal  Assessment and Plan:  #1 symptomatic anemia: Patient will be admitted and transfused 1 unit  of packed red blood cells to start with.  Will check posttransfusion H&H  #2 sickle cell pain crisis: Patient said her pain is controlled.  We will not start her on PCA.  Will monitor her with as needed pain medicine if pain occurs  #3 leukocytosis: Most likely secondary to vaso-occlusive crisis.  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    Advance Care Planning:   Code Status: Prior full  Consults: None  Family Communication: No family at bedside  Severity of Illness: The appropriate patient status for this patient is OBSERVATION. Observation status is judged to be reasonable and necessary in order to provide the required intensity of service to ensure the patient's safety. The patient's presenting symptoms, physical exam findings, and initial radiographic and laboratory data in the context of their medical condition is felt to place them at decreased risk for further clinical deterioration. Furthermore, it is anticipated that the patient will be medically stable for discharge from the hospital within 2 midnights of admission.   AuthorBETHA SIM KNOLL, MD 02/18/2023 1:00 PM  For on call review [email or web address].

## 2023-02-18 NOTE — ED Provider Notes (Signed)
 [place] EMERGENCY DEPARTMENT AT [facility] Provider Note   CSN: [id number] Arrival date & time: 02/18/23  0033     History  Chief Complaint  Patient presents with   Sickle Cell Pain Crisis    Alicia Villegas is a 23 y.o. female.  The history is provided by the patient.  Sickle Cell Pain Crisis Alicia Villegas is a 23 y.o. female who presents to the Emergency Department complaining of sickle cell pain crisis.  She presents to the emergency department for evaluation of symptoms that started abruptly at 10 PM.  She complains of pain in her back, bilateral legs.  She also has some intermittent chest discomfort.  No associated fever, vomiting, difficulty breathing.  She has a history of sickle cell anemia and this feels typical for her pain crises.  She did take Excedrin at home without improvement in her symptoms.  No associated dysuria or leg swelling.     Home Medications Prior to Admission medications   Medication Sig Start Date End Date Taking? Authorizing Provider  acetaminophen  (TYLENOL ) 500 MG tablet Take 500-1,000 mg by mouth every 6 (six) hours as needed for mild pain (pain score 1-3) or moderate pain (pain score 4-6).    [provider]  Cholecalciferol  (VITAMIN D3) 25 MCG (1000 UT) CAPS Take 1 capsule (1,000 Units total) by mouth daily. 12/19/22   Paseda, Folashade R, FNP  folic acid  (FOLVITE ) 1 MG tablet Take 1 tablet (1 mg total) by mouth daily. 12/19/22   Paseda, Folashade R, FNP  hydroxyurea  (HYDREA ) 100 mg/mL SUSP Take by mouth. 10/23/22   [provider]  ibuprofen  (ADVIL ) 200 MG tablet Take 200-800 mg by mouth every 6 (six) hours as needed for mild pain or headache.    [provider]      Allergies    Patient has no known allergies.    Review of Systems   Review of Systems  All other systems reviewed and are negative.   Physical Exam Updated Vital Signs BP (!) 110/52 (BP Location: Right Arm)   Pulse 93   Temp 98.3 F (36.8  C) (Oral)   Resp 15   SpO2 97%  Physical Exam Vitals and nursing note reviewed.  Constitutional:      Appearance: She is well-developed.     Comments: Uncomfortable appearing  HENT:     Head: Normocephalic and atraumatic.  Cardiovascular:     Rate and Rhythm: Normal rate and regular rhythm.     Heart sounds: No murmur heard. Pulmonary:     Effort: Pulmonary effort is normal. No respiratory distress.     Breath sounds: Normal breath sounds.  Abdominal:     Palpations: Abdomen is soft.     Tenderness: There is no abdominal tenderness. There is no guarding or rebound.  Musculoskeletal:        General: No swelling or tenderness.  Skin:    General: Skin is warm and dry.  Neurological:     Mental Status: She is alert and oriented to person, place, and time.  Psychiatric:        Behavior: Behavior normal.     ED Results / Procedures / Treatments   Labs (all labs ordered are listed, but only abnormal results are displayed) Labs Reviewed  COMPREHENSIVE METABOLIC PANEL - Abnormal; Notable for the following components:      Result Value   CO2 18 (*)    Creatinine, Ser <0.30 (*)    AST 45 (*)  Total Bilirubin 1.9 (*)    All other components within normal limits  HCG, SERUM, QUALITATIVE  LIPASE, BLOOD  CBC WITH DIFFERENTIAL/PLATELET  CBC WITH DIFFERENTIAL/PLATELET  RETICULOCYTES    EKG EKG Interpretation Date/Time:  Monday February 18 2023 01:26:19 EST Ventricular Rate:  87 PR Interval:  156 QRS Duration:  79 QT Interval:  366 QTC Calculation: 441 R Axis:   73  Text Interpretation: Sinus rhythm Confirmed by Griselda Norris [phone]) on 02/18/2023 2:57:13 AM  Radiology DG Chest 2 View Result Date: 02/18/2023 CLINICAL DATA:  Sickle cell pain. EXAM: CHEST - 2 VIEW COMPARISON:  October 12, 2022 FINDINGS: The heart size and mediastinal contours are within normal limits. Both lungs are clear. Radiopaque surgical clips are seen within the right upper quadrant. There is mild  dextroscoliosis of the midthoracic spine. IMPRESSION: No active cardiopulmonary disease. Electronically Signed   By: Suzen Dials M.D.   On: 02/18/2023 01:10    Procedures Procedures    Medications Ordered in ED Medications  diphenhydrAMINE  (BENADRYL ) capsule 25-50 mg (25 mg Oral Given 02/18/23 0527)  ondansetron  (ZOFRAN ) injection 4 mg (4 mg Intravenous Given 02/18/23 0527)  0.45 % sodium chloride  infusion ( Intravenous New Bag/Given 02/18/23 0648)  ondansetron  (ZOFRAN ) injection 4 mg (has no administration in time range)  HYDROmorphone  (DILAUDID ) injection 0.5 mg (0.5 mg Subcutaneous Given 02/18/23 0205)  morphine  (PF) 4 MG/ML injection 6 mg (6 mg Intravenous Given 02/18/23 0527)  morphine  (PF) 4 MG/ML injection 8 mg (8 mg Intravenous Given 02/18/23 9340)    ED Course/ Medical Decision Making/ A&P                                 Medical Decision Making Amount and/or Complexity of Data Reviewed Labs: ordered. Radiology: ordered.  Risk Prescription drug management.   Patient with history of sickle cell disease here for evaluation of back pain, bilateral leg pain similar to prior pain crises.  CBC clotted on multiple draws.  Plan to treat her pain.  Patient care transferred pending reassessment after pain control and formal CBC.  No evidence of acute chest.  Presentation is not consistent with DVT/PE.        Final Clinical Impression(s) / ED Diagnoses Final diagnoses:  None    Rx / DC Orders ED Discharge Orders     None         Griselda Norris, MD 02/18/23 0710

## 2023-02-18 NOTE — Progress Notes (Signed)
 Took Pt's V/S upon arrival to the floor. BP 99/56, pulse 91, temp 98.4 and SPO2 88. Wanted to put pt on O2 per and PCA per order and she refused stating that she is no more in pain. That she only came to the floor to get her blood transfused and go home.  Paged Dr. Spencer Re who responded that I Document it down.

## 2023-02-18 NOTE — ED Provider Notes (Signed)
 Patient signed out to me at 0700 by Dr. Griselda pending labs and reassessment.  In short this is a 23 year old female with a past medical history of sickle cell anemia that presented to the emergency department with sickle cell pain crisis.  She reported her pain felt similar to prior crises in the past.  She has been hemodynamically stable here.  Patient was started on pain control.  My evaluation, her CBC has resulted with a hemoglobin of 6.4, she states that she is normally transfused with hemoglobin less than 7 and is agreeable for blood transfusion today.  She does report some improvement of her pain but is not yet sure if she feels comfortable with discharge after the transfusion or may require admission for further pain control.  She states that she has a heat pack on her abdomen and that is helping at this time.  1 unit of PRBCs is ordered and she will be reassessed for disposition.  CRITICAL CARE Performed by: Richerd MARLA Later   Total critical care time: 30 minutes  Critical care time was exclusive of separately billable procedures and treating other patients.  Critical care was necessary to treat or prevent imminent or life-threatening deterioration.  Critical care was time spent personally by me on the following activities: development of treatment plan with patient and/or surrogate as well as nursing, discussions with consultants, evaluation of patient's response to treatment, examination of patient, obtaining history from patient or surrogate, ordering and performing treatments and interventions, ordering and review of laboratory studies, ordering and review of radiographic studies, pulse oximetry and re-evaluation of patient's condition.  Clinical Course as of 02/18/23 1234  Mon Feb 18, 2023  9096 Upon reassessment, the patient reports that her pain is currently well-controlled, would like to try to go home after her blood transfusion.  She will continue to be closely reassessed. [VK]   1217 RN spoke with blood bank, she has multiple antibodies and will take several hours to obtain blood, possible will need to be brought from [place]. Patient reports mildly worsening pain. She is agreeable for admission for her anemia and pain control. [VK]    Clinical Course User Index [VK] Kingsley, Izaiah Tabb K, DO      Kingsley, Vora Clover K, [place] 02/18/23 1234

## 2023-02-19 DIAGNOSIS — D649 Anemia, unspecified: Secondary | ICD-10-CM | POA: Diagnosis not present

## 2023-02-19 LAB — CBC WITH DIFFERENTIAL/PLATELET
Abs Immature Granulocytes: 0.07 10*3/uL (ref 0.00–0.07)
Basophils Absolute: 0.1 10*3/uL (ref 0.0–0.1)
Basophils Relative: 1 %
Eosinophils Absolute: 0.4 10*3/uL (ref 0.0–0.5)
Eosinophils Relative: 3 %
HCT: 17.7 % — ABNORMAL LOW (ref 36.0–46.0)
Hemoglobin: 6.5 g/dL — CL (ref 12.0–15.0)
Immature Granulocytes: 1 %
Lymphocytes Relative: 36 %
Lymphs Abs: 3.9 10*3/uL (ref 0.7–4.0)
MCH: 34.8 pg — ABNORMAL HIGH (ref 26.0–34.0)
MCHC: 36.7 g/dL — ABNORMAL HIGH (ref 30.0–36.0)
MCV: 94.7 fL (ref 80.0–100.0)
Monocytes Absolute: 0.9 10*3/uL (ref 0.1–1.0)
Monocytes Relative: 8 %
Neutro Abs: 5.7 10*3/uL (ref 1.7–7.7)
Neutrophils Relative %: 51 %
Platelet Morphology: NORMAL
Platelets: 462 10*3/uL — ABNORMAL HIGH (ref 150–400)
RBC: 1.87 MIL/uL — ABNORMAL LOW (ref 3.87–5.11)
RDW: 23.8 % — ABNORMAL HIGH (ref 11.5–15.5)
WBC: 11 10*3/uL — ABNORMAL HIGH (ref 4.0–10.5)
nRBC: 1.2 % — ABNORMAL HIGH (ref 0.0–0.2)

## 2023-02-19 LAB — COMPREHENSIVE METABOLIC PANEL
ALT: 13 U/L (ref 0–44)
AST: 32 U/L (ref 15–41)
Albumin: 4.2 g/dL (ref 3.5–5.0)
Alkaline Phosphatase: 72 U/L (ref 38–126)
Anion gap: 6 (ref 5–15)
BUN: 9 mg/dL (ref 6–20)
CO2: 17 mmol/L — ABNORMAL LOW (ref 22–32)
Calcium: 8.5 mg/dL — ABNORMAL LOW (ref 8.9–10.3)
Chloride: 110 mmol/L (ref 98–111)
Creatinine, Ser: <0.3 mg/dL — ABNORMAL LOW (ref 0.44–1.00)
Glucose, Bld: 99 mg/dL (ref 70–99)
Potassium: 3.7 mmol/L (ref 3.5–5.1)
Sodium: 133 mmol/L — ABNORMAL LOW (ref 135–145)
Total Bilirubin: 2.1 mg/dL — ABNORMAL HIGH (ref 0.0–1.2)
Total Protein: 7.3 g/dL (ref 6.5–8.1)

## 2023-02-19 MED ORDER — FOLIC ACID 1 MG PO TABS
1.0000 mg | ORAL_TABLET | Freq: Every day | ORAL | Status: DC
  Administered 2023-02-20: 1 mg via ORAL
  Filled 2023-02-19: qty 1

## 2023-02-19 MED ORDER — HYDROXYUREA 100 MG/ML ORAL SUSPENSION: 1200.0000 mg | Freq: Every day | ORAL | Status: DC

## 2023-02-19 MED ORDER — HYDROXYUREA 200 MG PO CAPS
200.0000 mg | ORAL_CAPSULE | Freq: Every day | ORAL | Status: DC
  Filled 2023-02-19 (×2): qty 1

## 2023-02-19 MED ORDER — VITAMIN D 25 MCG (1000 UNIT) PO TABS
1000.0000 [IU] | ORAL_TABLET | Freq: Every day | ORAL | Status: DC
  Administered 2023-02-20: 1000 [IU] via ORAL
  Filled 2023-02-19: qty 1

## 2023-02-19 MED ORDER — HYDROXYUREA 500 MG PO CAPS
1000.0000 mg | ORAL_CAPSULE | Freq: Every day | ORAL | Status: DC
  Filled 2023-02-19: qty 2

## 2023-02-19 MED ORDER — HYDROXYUREA 500 MG PO CAPS: 1200.0000 mg | ORAL_CAPSULE | Freq: Every day | ORAL | Status: DC

## 2023-02-19 NOTE — Progress Notes (Signed)
 SICKLE CELL SERVICE PROGRESS NOTE  Alicia Villegas FMW:[id number] DOB: 06/01/2000 DOA: 02/18/2023 PCP: Tilford Bertram HERO, FNP  Assessment/Plan: Principal Problem:   Symptomatic anemia Active Problems:   Short stature disorder   Sickle cell pain crisis ([name])  Symptomatic anemia: Patient is still awaiting blood transfusion.  She has multiple antibodies.  There is difficult to get her blood maxed.  Hemoglobin remained at 6.5.  We will transfuse patient prior to discharge home she denied any significant pain. Sickle cell pain crisis: Patient denied pain today.  She has been off PCA.  She is only on as needed medications. Thrombocytosis: Continue to monitor platelets. Hyponatremia: Sodium at 133.  Continue to monitor  Code Status: Full code Family Communication: No family at bedside Disposition Plan: Home when ready  [facility]  Pager [phone]. If 7PM-7AM, please contact night-coverage.  02/19/2023, 5:17 PM  LOS: 1 day   Brief narrative:  Alicia Villegas is a 23 y.o. female with medical history significant of cell disease, chronic disease, vitamin D  deficiency, consistent with her sickle cell crisis.  Patient was treated with multiple doses of IV Dilaudid .  She responded to the pain medicine well however hemoglobin was noted to be fall way below her baseline.  Her last hemoglobin was 10.7 in December but now 6.4.  Also white count is up to 20,000 and platelets 424.  As a result patient is admitted with symptomatic anemia and sickle cell crisis.   Consultants: None  Procedures: Blood transfusion  Antibiotics: None  HPI/Subjective: Patient has no pain today.  Still awaiting blood transfusion.  She is overall doing better with no significant issues and no complaints.  Objective: Vitals:   02/18/23 2039 02/19/23 0039 02/19/23 0513 02/19/23 1429  BP: (!) 91/50 100/63 (!) 92/56 (!) 90/53  Pulse: 87 99 90 95  Resp: 18 18 18 18   Temp: 98 F (36.7 C) 99.5 F (37.5 C) 99.1 F (37.3  C) 99.3 F (37.4 C)  TempSrc:    Oral  SpO2: 91% 91% (!) 89% 90%  Weight:      Height:       Weight change:   Intake/Output Summary (Last 24 hours) at 02/19/2023 1717 Last data filed at 02/19/2023 1502 Gross per 24 hour  Intake 972.26 ml  Output --  Net 972.26 ml    General: Alert, awake, oriented x3, in no acute distress.  HEENT: [place]/AT PEERL, EOMI Neck: Trachea midline,  no masses, no thyromegal,y no JVD, no carotid bruit OROPHARYNX:  Moist, No exudate/ erythema/lesions.  Heart: Regular rate and rhythm, without murmurs, rubs, gallops, PMI non-displaced, no heaves or thrills on palpation.  Lungs: Clear to auscultation, no wheezing or rhonchi noted. No increased vocal fremitus resonant to percussion  Abdomen: Soft, nontender, nondistended, positive bowel sounds, no masses no hepatosplenomegaly noted..  Neuro: No focal neurological deficits noted cranial nerves II through XII grossly intact. DTRs 2+ bilaterally upper and lower extremities. Strength 5 out of 5 in bilateral upper and lower extremities. Musculoskeletal: No warm swelling or erythema around joints, no spinal tenderness noted. Psychiatric: Patient alert and oriented x3, good insight and cognition, good recent to remote recall. Lymph node survey: No cervical axillary or inguinal lymphadenopathy noted.   Data Reviewed: Basic Metabolic Panel: Recent Labs  Lab 02/18/23 0520 02/18/23 1722 02/19/23 0817  NA 137  --  133*  K 4.0  --  3.7  CL 109  --  110  CO2 18*  --  17*  GLUCOSE 94  --  99  BUN 8  --  9  CREATININE <0.30* 0.34* <0.30*  CALCIUM 9.1  --  8.5*   Liver Function Tests: Recent Labs  Lab 02/18/23 0520 02/19/23 0817  AST 45* 32  ALT 13 13  ALKPHOS 77 72  BILITOT 1.9* 2.1*  PROT 7.7 7.3  ALBUMIN 4.4 4.2   Recent Labs  Lab 02/18/23 0520  LIPASE 41   No results for input(s): AMMONIA in the last 168 hours. CBC: Recent Labs  Lab 02/18/23 0616 02/18/23 1722 02/19/23 0817  WBC 20.1* 13.2*  11.0*  NEUTROABS 11.7*  --  5.7  HGB 6.4* 6.6* 6.5*  HCT 17.2* 18.1* 17.7*  MCV 95.0 95.3 94.7  PLT 424* 463* 462*   Cardiac Enzymes: No results for input(s): CKTOTAL, CKMB, CKMBINDEX, TROPONINI in the last 168 hours. BNP (last 3 results) No results for input(s): BNP in the last 8760 hours.  ProBNP (last 3 results) No results for input(s): PROBNP in the last 8760 hours.  CBG: No results for input(s): GLUCAP in the last 168 hours.  No results found for this or any previous visit (from the past 240 hours).   Studies: DG Chest 2 View Result Date: 02/18/2023 CLINICAL DATA:  Sickle cell pain. EXAM: CHEST - 2 VIEW COMPARISON:  October 12, 2022 FINDINGS: The heart size and mediastinal contours are within normal limits. Both lungs are clear. Radiopaque surgical clips are seen within the right upper quadrant. There is mild dextroscoliosis of the midthoracic spine. IMPRESSION: No active cardiopulmonary disease. Electronically Signed   By: Suzen Dials M.D.   On: 02/18/2023 01:10    Scheduled Meds:  sodium chloride    Intravenous Once   cholecalciferol   1,000 Units Oral Daily   enoxaparin  (LOVENOX ) injection  40 mg Subcutaneous Q24H   folic acid   1 mg Oral Daily   HYDROmorphone    Intravenous Q4H   hydroxyurea   1,000 mg Oral Daily   And   hydroxyurea   200 mg Oral Daily   ketorolac   15 mg Intravenous Q6H   senna-docusate  1 tablet Oral BID   Continuous Infusions:  Principal Problem:   Symptomatic anemia Active Problems:   Short stature disorder   Sickle cell pain crisis ([name])

## 2023-02-19 NOTE — Plan of Care (Signed)
   Problem: Education: Goal: Knowledge of General Education information will improve Description Including pain rating scale, medication(s)/side effects and non-pharmacologic comfort measures Outcome: Progressing

## 2023-02-20 DIAGNOSIS — D649 Anemia, unspecified: Secondary | ICD-10-CM | POA: Diagnosis not present

## 2023-02-20 LAB — CBC
HCT: 17 % — ABNORMAL LOW (ref 36.0–46.0)
HCT: 24.1 % — ABNORMAL LOW (ref 36.0–46.0)
Hemoglobin: 6.3 g/dL — CL (ref 12.0–15.0)
Hemoglobin: 8.8 g/dL — ABNORMAL LOW (ref 12.0–15.0)
MCH: 35.4 pg — ABNORMAL HIGH (ref 26.0–34.0)
MCH: 32 pg (ref 26.0–34.0)
MCHC: 37.1 g/dL — ABNORMAL HIGH (ref 30.0–36.0)
MCHC: 36.5 g/dL — ABNORMAL HIGH (ref 30.0–36.0)
MCV: 95.5 fL (ref 80.0–100.0)
MCV: 87.6 fL (ref 80.0–100.0)
Platelets: 469 10*3/uL — ABNORMAL HIGH (ref 150–400)
Platelets: 496 10*3/uL — ABNORMAL HIGH (ref 150–400)
RBC: 1.78 MIL/uL — ABNORMAL LOW (ref 3.87–5.11)
RBC: 2.75 MIL/uL — ABNORMAL LOW (ref 3.87–5.11)
RDW: 22.8 % — ABNORMAL HIGH (ref 11.5–15.5)
RDW: 22.8 % — ABNORMAL HIGH (ref 11.5–15.5)
WBC: 11.2 10*3/uL — ABNORMAL HIGH (ref 4.0–10.5)
WBC: 7.6 10*3/uL (ref 4.0–10.5)
nRBC: 0.3 % — ABNORMAL HIGH (ref 0.0–0.2)
nRBC: 0.4 % — ABNORMAL HIGH (ref 0.0–0.2)

## 2023-02-20 NOTE — Discharge Summary (Signed)
 Physician Discharge Summary   Patient: Alicia Villegas MRN: [id number] DOB: 04-05-2000  Admit date:     02/18/2023  Discharge date:   Discharge Physician: Carolin Chyle   PCP: Sigurd Driver, FNP   Recommendations at discharge:   Follow-up with your PCP.  Get blood work done within a week to see if hemoglobin stays stable  Discharge Diagnoses: Principal Problem:   Symptomatic anemia Active Problems:   Short stature disorder   Sickle cell pain crisis ([name])  Resolved Problems:   * No resolved hospital problems. [facility] Course: Patient was admitted with symptomatic anemia from her sickle cell crisis.  Initial hemoglobin was 6.4 which was below her baseline of 7 g to 8 g.  She was feeling weak.  Also leukocytosis with a white count of over 20,000.  Her pain however was controlled almost immediately.  She did not require PCA.  Hemoglobin ultimately drifted down to 6.3.  She has a lot of antibodies so transfusion was hard.  She finally was able to be transfused today.  She feels better and wants to go home.  She has posttransfusion H&H being checked.  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  She will follow-up with for further blood work within the week to make sure her H&H stays stable.  Assessment and Plan: No notes have been filed under this hospital service. Service: Hospitalist        Consultants: None Procedures performed: Blood transfusion Disposition: Home Diet recommendation:  Discharge Diet Orders (From admission, onward)     Start     Ordered   02/20/23 0000  Diet - low sodium heart healthy        02/20/23 1322           Regular diet DISCHARGE MEDICATION: Allergies as of 02/20/2023   No Known Allergies      Medication List     TAKE these medications    acetaminophen  500 MG tablet Commonly known as: TYLENOL  Take 500-1,000 mg by mouth every 6 (six) hours as needed for mild pain (pain score 1-3) or moderate pain (pain score 4-6).    EXCEDRIN PO Take 2 tablets by mouth daily as needed.   folic acid  1 MG tablet Commonly known as: FOLVITE  Take 1 tablet (1 mg total) by mouth daily.   hydroxyurea  100 mg/mL Susp Commonly known as: HYDREA  Take by mouth.   ibuprofen  200 MG tablet Commonly known as: ADVIL  Take 200-800 mg by mouth every 6 (six) hours as needed for mild pain or headache.   Vitamin D3 25 MCG (1000 UT) Caps Take 1 capsule (1,000 Units total) by mouth daily.        Discharge Exam: Filed Weights   02/18/23 0720  Weight: 49.9 kg   Constitutional: NAD, calm, comfortable Eyes: PERRL, lids and conjunctivae normal ENMT: Mucous membranes are moist. Posterior pharynx clear of any exudate or lesions.Normal dentition.  Neck: normal, supple, no masses, no thyromegaly Respiratory: clear to auscultation bilaterally, no wheezing, no crackles. Normal respiratory effort. No accessory muscle use.  Cardiovascular: Regular rate and rhythm, no murmurs / rubs / gallops. No extremity edema. 2+ pedal pulses. No carotid bruits.  Abdomen: no tenderness, no masses palpated. No hepatosplenomegaly. Bowel sounds positive.  Musculoskeletal: Good range of motion, no joint swelling or tenderness, Skin: no rashes, lesions, ulcers. No induration Neurologic: CN 2-12 grossly intact. Sensation intact, DTR normal. Strength 5/5 in all 4.  Psychiatric: Normal judgment and insight. Alert and oriented x 3.  Normal mood   Condition at discharge: good  The results of significant diagnostics from this hospitalization (including imaging, microbiology, ancillary and laboratory) are listed below for reference.   Imaging Studies: DG Chest 2 View Result Date: 02/18/2023 CLINICAL DATA:  Sickle cell pain. EXAM: CHEST - 2 VIEW COMPARISON:  October 12, 2022 FINDINGS: The heart size and mediastinal contours are within normal limits. Both lungs are clear. Radiopaque surgical clips are seen within the right upper quadrant. There is mild  dextroscoliosis of the midthoracic spine. IMPRESSION: No active cardiopulmonary disease. Electronically Signed   By: Virgle Grime M.D.   On: 02/18/2023 01:10    Microbiology: Results for orders placed or performed during the hospital encounter of 10/12/22  Culture, blood (Routine X 2) w Reflex to ID Panel     Status: None   Collection Time: 10/12/22  7:15 PM   Specimen: BLOOD LEFT ARM  Result Value Ref Range Status   Specimen Description   Final    BLOOD LEFT ARM Performed at [facility], [address] [place], [place] [address]    Special Requests   Final    BOTTLES DRAWN AEROBIC AND ANAEROBIC Blood Culture adequate volume Performed at [facility], [address], [place], [place] [address]    Culture   Final    NO GROWTH 5 DAYS Performed at [facility], [address] [place], [place] [address]    Report Status 10/17/2022 FINAL  Final  Culture, blood (Routine X 2) w Reflex to ID Panel     Status: None   Collection Time: 10/12/22  7:15 PM   Specimen: BLOOD LEFT ARM  Result Value Ref Range Status   Specimen Description   Final    BLOOD LEFT ARM Performed at [facility], [address], [place], [place] [address]    Special Requests   Final    BOTTLES DRAWN AEROBIC AND ANAEROBIC Blood Culture adequate volume Performed at [facility], [address], [place], [place] [address]    Culture   Final    NO GROWTH 5 DAYS Performed at [facility], [address] [place], [place] [address]    Report Status 10/17/2022 FINAL  Final    Labs: CBC: Recent Labs  Lab 02/18/23 0616 02/18/23 1722 02/19/23 0817 02/20/23 0623  WBC 20.1* 13.2* 11.0* 11.2*  NEUTROABS 11.7*  --  5.7  --   HGB 6.4* 6.6* 6.5* 6.3*  HCT 17.2* 18.1* 17.7* 17.0*  MCV 95.0 95.3 94.7 95.5  PLT 424* 463* 462* 469*   Basic Metabolic Panel: Recent Labs  Lab 02/18/23 0520 02/18/23 1722 02/19/23 0817  NA 137  --  133*  K 4.0  --   3.7  CL 109  --  110  CO2 18*  --  17*  GLUCOSE 94  --  99  BUN 8  --  9  CREATININE <0.30* 0.34* <0.30*  CALCIUM 9.1  --  8.5*   Liver Function Tests: Recent Labs  Lab 02/18/23 0520 02/19/23 0817  AST 45* 32  ALT 13 13  ALKPHOS 77 72  BILITOT 1.9* 2.1*  PROT 7.7 7.3  ALBUMIN 4.4 4.2   CBG: No results for input(s): "GLUCAP" in the last 168 hours.  Discharge time spent: greater than 30 minutes.  SignedCarolin Chyle, MD [facility] 02/20/2023

## 2023-02-20 NOTE — Plan of Care (Signed)
  Problem: Education: Goal: Knowledge of General Education information will improve Description: Including pain rating scale, medication(s)/side effects and non-pharmacologic comfort measures Outcome: Progressing   Problem: Health Behavior/Discharge Planning: Goal: Ability to manage health-related needs will improve Outcome: Progressing   Problem: Clinical Measurements: Goal: Will remain free from infection Outcome: Progressing Goal: Diagnostic test results will improve Outcome: Progressing Goal: Respiratory complications will improve Outcome: Progressing Goal: Cardiovascular complication will be avoided Outcome: Progressing   Problem: Activity: Goal: Risk for activity intolerance will decrease Outcome: Progressing   Problem: Nutrition: Goal: Adequate nutrition will be maintained Outcome: Progressing   Problem: Elimination: Goal: Will not experience complications related to bowel motility Outcome: Progressing Goal: Will not experience complications related to urinary retention Outcome: Progressing   Problem: Safety: Goal: Ability to remain free from injury will improve Outcome: Progressing   Problem: Skin Integrity: Goal: Risk for impaired skin integrity will decrease Outcome: Progressing   Problem: Education: Goal: Knowledge of vaso-occlusive preventative measures will improve Outcome: Progressing Goal: Awareness of infection prevention will improve Outcome: Progressing Goal: Awareness of signs and symptoms of anemia will improve Outcome: Progressing Goal: Long-term complications will improve Outcome: Progressing   Problem: Self-Care: Goal: Ability to incorporate actions that prevent/reduce pain crisis will improve Outcome: Progressing   Problem: Bowel/Gastric: Goal: Gut motility will be maintained Outcome: Progressing   Problem: Tissue Perfusion: Goal: Complications related to inadequate tissue perfusion will be avoided or minimized Outcome:  Progressing   Problem: Respiratory: Goal: Pulmonary complications will be avoided or minimized Outcome: Progressing Goal: Acute Chest Syndrome will be identified early to prevent complications Outcome: Progressing   Problem: Fluid Volume: Goal: Ability to maintain a balanced intake and output will improve Outcome: Progressing   Problem: Sensory: Goal: Pain level will decrease with appropriate interventions Outcome: Progressing   Problem: Health Behavior: Goal: Postive changes in compliance with treatment and prescription regimens will improve Outcome: Progressing

## 2023-02-20 NOTE — TOC Transition Note (Signed)
 Transition of Care [facility]) - Discharge Note   Patient Details  Name: Alicia Villegas MRN: [id number] Date of Birth: [birth year]  [facility] CM/SW Contact:  Loreda Rodriguez, RN Phone Number:[phone]  02/20/2023, 1:40 PM   Clinical Narrative:    Patient with discharge orders. Nurse has inquired about transportation resources. TOC has offered bus pass. Pass pending patient acceptance.    Final next level of care: Home/Self Care Barriers to Discharge: No Barriers Identified   Patient Goals and CMS Choice Patient states their goals for this hospitalization and ongoing recovery are:: Ready to go home CMS Medicare.gov Compare Post Acute Care list provided to::  (n/a) Choice offered to / list presented to : NA [place] ownership interest in [facility].provided to::  (n/a)    Discharge Placement                       Discharge Plan and Services Additional resources added to the After Visit Summary for                  DME Arranged: N/A DME Agency: NA       HH Arranged: NA HH Agency: NA        Social Drivers of Health (SDOH) Interventions SDOH Screenings   Food Insecurity: No Food Insecurity (02/18/2023)  Housing: Unknown (02/18/2023)  Transportation Needs: No Transportation Needs (02/18/2023)  Utilities: Not At Risk (02/18/2023)  Depression (PHQ2-9): Low Risk  (10/23/2022)  Social Connections: Unknown (02/18/2023)  Tobacco Use: Low Risk  (02/18/2023)     Readmission Risk Interventions     No data to display

## 2023-02-20 NOTE — Progress Notes (Signed)
 Date and time results received: 02/20/23 7:44 AM  Test: hgb   Critical Value: 6.3  Name of Provider Notified: Carlton Chick

## 2023-02-20 NOTE — Progress Notes (Signed)
   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

## 2023-02-20 NOTE — Hospital Course (Signed)
 Patient was admitted with symptomatic anemia from her sickle cell crisis.  Initial hemoglobin was 6.4 which was below her baseline of 7 g to 8 g.  She was feeling weak.  Also leukocytosis with a white count of over 20,000.  Her pain however was controlled almost immediately.  She did not require PCA.  Hemoglobin ultimately drifted down to 6.3.  She has a lot of antibodies so transfusion was hard.  She finally was able to be transfused today.  She feels better and wants to go home.  She has posttransfusion H&H being checked.  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  She will follow-up with for further blood work within the week to make sure her H&H stays stable.

## 2023-02-21 LAB — TYPE AND SCREEN
ABO/RH(D): AB POS
Antibody Screen: POSITIVE
Unit division: 0

## 2023-02-21 LAB — BPAM RBC
Blood Product Expiration Date: 202502122359
ISSUE DATE / TIME: 202501150957
Unit Type and Rh: 6200

## 2023-02-26 ENCOUNTER — Encounter: Payer: Self-pay | Admitting: Gastroenterology

## 2023-03-15 ENCOUNTER — Telehealth (HOSPITAL_COMMUNITY): Payer: Self-pay

## 2023-03-15 DIAGNOSIS — R1084 Generalized abdominal pain: Secondary | ICD-10-CM | POA: Diagnosis not present

## 2023-03-15 DIAGNOSIS — R110 Nausea: Secondary | ICD-10-CM | POA: Diagnosis not present

## 2023-03-15 NOTE — Telephone Encounter (Signed)
 Nurse from [facility] called in saying this pt is currently in their office. RN advised that it is too late in the day for pt to come to clinic. RN advised that if pt is having any issues that she should go to the ER. RN offered to provide Patient care center primary care side phone number. RN advised that the day hospital will be open on Monday.

## 2023-03-22 ENCOUNTER — Ambulatory Visit: Payer: Self-pay | Admitting: Nurse Practitioner

## 2023-03-25 DIAGNOSIS — D571 Sickle-cell disease without crisis: Secondary | ICD-10-CM | POA: Diagnosis not present

## 2023-03-25 DIAGNOSIS — E559 Vitamin D deficiency, unspecified: Secondary | ICD-10-CM | POA: Diagnosis not present

## 2023-03-25 DIAGNOSIS — R899 Unspecified abnormal finding in specimens from other organs, systems and tissues: Secondary | ICD-10-CM | POA: Diagnosis not present

## 2023-03-28 ENCOUNTER — Other Ambulatory Visit (HOSPITAL_COMMUNITY): Payer: Self-pay

## 2023-04-03 ENCOUNTER — Telehealth: Payer: Self-pay | Admitting: Neurology

## 2023-04-03 NOTE — Telephone Encounter (Signed)
 Pt's friend, Ruben Gottron cancelling patient's appointment at patient request due to migraines have improved

## 2023-04-04 ENCOUNTER — Ambulatory Visit: Payer: Medicaid Other | Admitting: Neurology

## 2023-04-05 ENCOUNTER — Other Ambulatory Visit (HOSPITAL_COMMUNITY): Payer: Self-pay

## 2023-05-07 ENCOUNTER — Ambulatory Visit: Payer: Medicaid Other | Admitting: Gastroenterology

## 2023-06-26 ENCOUNTER — Emergency Department (HOSPITAL_COMMUNITY)

## 2023-06-26 ENCOUNTER — Encounter (HOSPITAL_COMMUNITY): Payer: Self-pay

## 2023-06-26 ENCOUNTER — Other Ambulatory Visit: Payer: Self-pay

## 2023-06-26 ENCOUNTER — Inpatient Hospital Stay (HOSPITAL_COMMUNITY)
Admission: EM | Admit: 2023-06-26 | Discharge: 2023-06-27 | DRG: 812 | Disposition: A | Discharge status: Home / Self Care | Attending: Internal Medicine | Admitting: Internal Medicine

## 2023-06-26 DIAGNOSIS — Z9049 Acquired absence of other specified parts of digestive tract: Secondary | ICD-10-CM

## 2023-06-26 DIAGNOSIS — Z7982 Long term (current) use of aspirin: Secondary | ICD-10-CM | POA: Diagnosis not present

## 2023-06-26 DIAGNOSIS — D5700 Hb-SS disease with crisis, unspecified: Principal | ICD-10-CM

## 2023-06-26 DIAGNOSIS — Z8613 Personal history of malaria: Secondary | ICD-10-CM

## 2023-06-26 DIAGNOSIS — Z9081 Acquired absence of spleen: Secondary | ICD-10-CM | POA: Diagnosis not present

## 2023-06-26 DIAGNOSIS — D649 Anemia, unspecified: Secondary | ICD-10-CM | POA: Diagnosis not present

## 2023-06-26 DIAGNOSIS — R109 Unspecified abdominal pain: Secondary | ICD-10-CM | POA: Diagnosis present

## 2023-06-26 DIAGNOSIS — Z79899 Other long term (current) drug therapy: Secondary | ICD-10-CM | POA: Diagnosis not present

## 2023-06-26 DIAGNOSIS — G894 Chronic pain syndrome: Secondary | ICD-10-CM | POA: Diagnosis not present

## 2023-06-26 DIAGNOSIS — D638 Anemia in other chronic diseases classified elsewhere: Secondary | ICD-10-CM | POA: Diagnosis present

## 2023-06-26 DIAGNOSIS — G8929 Other chronic pain: Secondary | ICD-10-CM | POA: Insufficient documentation

## 2023-06-26 LAB — URINALYSIS, ROUTINE W REFLEX MICROSCOPIC
Bilirubin Urine: NEGATIVE
Glucose, UA: NEGATIVE mg/dL
Hgb urine dipstick: NEGATIVE
Ketones, ur: NEGATIVE mg/dL
Leukocytes,Ua: NEGATIVE
Nitrite: NEGATIVE
Protein, ur: NEGATIVE mg/dL
Specific Gravity, Urine: 1.043 — ABNORMAL HIGH (ref 1.005–1.030)
pH: 6 (ref 5.0–8.0)

## 2023-06-26 LAB — HIV ANTIBODY (ROUTINE TESTING W REFLEX): HIV Screen 4th Generation wRfx: NONREACTIVE

## 2023-06-26 LAB — LIPASE, BLOOD: Lipase: 37 U/L (ref 11–51)

## 2023-06-26 LAB — COMPREHENSIVE METABOLIC PANEL WITH GFR
ALT: 11 U/L (ref 0–44)
AST: 22 U/L (ref 15–41)
Albumin: 4.2 g/dL (ref 3.5–5.0)
Alkaline Phosphatase: 71 U/L (ref 38–126)
Anion gap: 9 (ref 5–15)
BUN: 8 mg/dL (ref 6–20)
CO2: 18 mmol/L — ABNORMAL LOW (ref 22–32)
Calcium: 9.2 mg/dL (ref 8.9–10.3)
Chloride: 110 mmol/L (ref 98–111)
Creatinine, Ser: <0.3 mg/dL — ABNORMAL LOW (ref 0.44–1.00)
Glucose, Bld: 96 mg/dL (ref 70–99)
Potassium: 3.2 mmol/L — ABNORMAL LOW (ref 3.5–5.1)
Sodium: 137 mmol/L (ref 135–145)
Total Bilirubin: 2.3 mg/dL — ABNORMAL HIGH (ref 0.0–1.2)
Total Protein: 7.7 g/dL (ref 6.5–8.1)

## 2023-06-26 LAB — CBC WITH DIFFERENTIAL/PLATELET
Abs Immature Granulocytes: 0.49 10*3/uL — ABNORMAL HIGH (ref 0.00–0.07)
Basophils Absolute: 0.1 10*3/uL (ref 0.0–0.1)
Basophils Relative: 0 %
Eosinophils Absolute: 0.4 10*3/uL (ref 0.0–0.5)
Eosinophils Relative: 2 %
HCT: 18.3 % — ABNORMAL LOW (ref 36.0–46.0)
Hemoglobin: 6.5 g/dL — CL (ref 12.0–15.0)
Immature Granulocytes: 3 %
Lymphocytes Relative: 20 %
Lymphs Abs: 3.4 10*3/uL (ref 0.7–4.0)
MCH: 32.7 pg (ref 26.0–34.0)
MCHC: 35.5 g/dL (ref 30.0–36.0)
MCV: 92 fL (ref 80.0–100.0)
Monocytes Absolute: 1.5 10*3/uL — ABNORMAL HIGH (ref 0.1–1.0)
Monocytes Relative: 9 %
Neutro Abs: 10.9 10*3/uL — ABNORMAL HIGH (ref 1.7–7.7)
Neutrophils Relative %: 66 %
Platelets: 469 10*3/uL — ABNORMAL HIGH (ref 150–400)
RBC: 1.99 MIL/uL — ABNORMAL LOW (ref 3.87–5.11)
RDW: 27.1 % — ABNORMAL HIGH (ref 11.5–15.5)
WBC: 16.8 10*3/uL — ABNORMAL HIGH (ref 4.0–10.5)
nRBC: 11.1 % — ABNORMAL HIGH (ref 0.0–0.2)

## 2023-06-26 LAB — HCG, SERUM, QUALITATIVE: Preg, Serum: NEGATIVE

## 2023-06-26 LAB — PREPARE RBC (CROSSMATCH)

## 2023-06-26 LAB — RETICULOCYTES
Immature Retic Fract: 37 % — ABNORMAL HIGH (ref 2.3–15.9)
RBC.: 1.98 MIL/uL — ABNORMAL LOW (ref 3.87–5.11)
Retic Count, Absolute: 175.6 10*3/uL (ref 19.0–186.0)
Retic Ct Pct: 8.8 % — ABNORMAL HIGH (ref 0.4–3.1)

## 2023-06-26 MED ORDER — ONDANSETRON HCL 4 MG/2ML IJ SOLN
4.0000 mg | Freq: Once | INTRAMUSCULAR | Status: AC
  Administered 2023-06-26: 4 mg via INTRAVENOUS
  Filled 2023-06-26: qty 2

## 2023-06-26 MED ORDER — KETOROLAC TROMETHAMINE 30 MG/ML IJ SOLN
30.0000 mg | Freq: Once | INTRAMUSCULAR | Status: AC
  Administered 2023-06-26: 30 mg via INTRAVENOUS
  Filled 2023-06-26: qty 1

## 2023-06-26 MED ORDER — HYDROMORPHONE 1 MG/ML IV SOLN: INTRAVENOUS | Status: DC

## 2023-06-26 MED ORDER — SENNOSIDES-DOCUSATE SODIUM 8.6-50 MG PO TABS
1.0000 | ORAL_TABLET | Freq: Two times a day (BID) | ORAL | Status: DC
  Filled 2023-06-26: qty 1

## 2023-06-26 MED ORDER — HYDROMORPHONE HCL 1 MG/ML IJ SOLN
1.0000 mg | INTRAMUSCULAR | Status: AC | PRN
  Administered 2023-06-26 (×2): 1 mg via INTRAVENOUS
  Filled 2023-06-26 (×2): qty 1

## 2023-06-26 MED ORDER — OXYCODONE HCL 5 MG PO TABS: 5.0000 mg | ORAL_TABLET | ORAL | Status: DC | PRN

## 2023-06-26 MED ORDER — SODIUM CHLORIDE 0.9 % IV BOLUS
500.0000 mL | Freq: Once | INTRAVENOUS | Status: AC
  Administered 2023-06-26: 500 mL via INTRAVENOUS

## 2023-06-26 MED ORDER — POLYETHYLENE GLYCOL 3350 17 G PO PACK: 17.0000 g | PACK | Freq: Every day | ORAL | Status: DC | PRN

## 2023-06-26 MED ORDER — HYDROMORPHONE HCL 1 MG/ML IJ SOLN: 2.0000 mg | INTRAMUSCULAR | Status: DC

## 2023-06-26 MED ORDER — SODIUM CHLORIDE 0.9% FLUSH: 9.0000 mL | INTRAVENOUS | Status: DC | PRN

## 2023-06-26 MED ORDER — IOHEXOL 300 MG/ML  SOLN
80.0000 mL | Freq: Once | INTRAMUSCULAR | Status: AC | PRN
  Administered 2023-06-26: 80 mL via INTRAVENOUS

## 2023-06-26 MED ORDER — HYDROMORPHONE HCL 1 MG/ML IJ SOLN
1.0000 mg | Freq: Once | INTRAMUSCULAR | Status: AC
  Administered 2023-06-26: 1 mg via INTRAVENOUS
  Filled 2023-06-26: qty 1

## 2023-06-26 MED ORDER — ACETAMINOPHEN 325 MG PO TABS: 650.0000 mg | ORAL_TABLET | Freq: Once | ORAL | Status: DC

## 2023-06-26 MED ORDER — NALOXONE HCL 0.4 MG/ML IJ SOLN: 0.4000 mg | INTRAMUSCULAR | Status: DC | PRN

## 2023-06-26 MED ORDER — SODIUM CHLORIDE 0.9% IV SOLUTION: Freq: Once | INTRAVENOUS | Status: DC

## 2023-06-26 MED ORDER — MORPHINE SULFATE (PF) 2 MG/ML IV SOLN
2.0000 mg | INTRAVENOUS | Status: DC | PRN
  Administered 2023-06-27: 2 mg via INTRAVENOUS
  Filled 2023-06-26: qty 1

## 2023-06-26 MED ORDER — SODIUM CHLORIDE 0.45 % IV SOLN: INTRAVENOUS | Status: DC

## 2023-06-26 MED ORDER — KETOROLAC TROMETHAMINE 15 MG/ML IJ SOLN
15.0000 mg | Freq: Four times a day (QID) | INTRAMUSCULAR | Status: DC
  Administered 2023-06-26 – 2023-06-27 (×3): 15 mg via INTRAVENOUS
  Filled 2023-06-26 (×4): qty 1

## 2023-06-26 MED ORDER — ACETAMINOPHEN 325 MG PO TABS
650.0000 mg | ORAL_TABLET | ORAL | Status: DC | PRN
  Administered 2023-06-26 – 2023-06-27 (×2): 650 mg via ORAL
  Filled 2023-06-26 (×2): qty 2

## 2023-06-26 MED ORDER — HYDROGEN PEROXIDE 3 % EX SOLN
CUTANEOUS | Status: AC
  Filled 2023-06-26: qty 473

## 2023-06-26 MED ORDER — DIPHENHYDRAMINE HCL 25 MG PO CAPS: 25.0000 mg | ORAL_CAPSULE | ORAL | Status: DC | PRN

## 2023-06-26 MED ORDER — DIPHENHYDRAMINE HCL 25 MG PO CAPS
25.0000 mg | ORAL_CAPSULE | Freq: Once | ORAL | Status: AC
  Administered 2023-06-26: 25 mg via ORAL
  Filled 2023-06-26: qty 1

## 2023-06-26 MED ORDER — ONDANSETRON HCL 4 MG/2ML IJ SOLN: 4.0000 mg | Freq: Four times a day (QID) | INTRAMUSCULAR | Status: DC | PRN

## 2023-06-26 MED ORDER — SODIUM CHLORIDE 0.9 % IV BOLUS
1000.0000 mL | Freq: Once | INTRAVENOUS | Status: AC
  Administered 2023-06-26: 1000 mL via INTRAVENOUS

## 2023-06-26 MED ORDER — HYDROMORPHONE HCL 1 MG/ML IJ SOLN
1.0000 mg | Freq: Once | INTRAMUSCULAR | Status: AC | PRN
  Administered 2023-06-26: 1 mg via INTRAVENOUS
  Filled 2023-06-26: qty 1

## 2023-06-26 MED ORDER — DIPHENHYDRAMINE HCL 50 MG/ML IJ SOLN
12.5000 mg | Freq: Once | INTRAMUSCULAR | Status: AC
  Administered 2023-06-26: 12.5 mg via INTRAVENOUS
  Filled 2023-06-26: qty 1

## 2023-06-26 NOTE — ED Provider Notes (Signed)
 [place] EMERGENCY DEPARTMENT AT [facility] Provider Note   CSN: [id number] Arrival date & time: 06/26/23  0757     History  Chief Complaint  Patient presents with   Sickle Cell Pain Crisis   Back Pain   Abdominal Pain    Alicia Villegas is a 23 y.o. female with a history of sickle cell disease presenting to ED with complaint of pain in her lower back and abdomen.  Patient reports this began last night.  She says her lower back feels like her typical sickle cell pain but her lower abdomen and nausea feels different to her.  Her last sickle cell crisis was in January at which time the patient was admitted, also every symptomatic anemia at that time.  Her baseline hemoglobin is reported to be 7-8 per discharge summary report 02/20/2023.  There were delays with her transfusion because she had antibodies.  Patient Nu[place] any current fevers, chills.  She reports a history of a cholecystectomy.  Denies history of appendectomy or other abdominal surgeries.  HPI     Home Medications Prior to Admission medications   Medication Sig Start Date End Date Taking? Authorizing Provider  acetaminophen  (TYLENOL ) 500 MG tablet Take 500-1,000 mg by mouth every 6 (six) hours as needed for mild pain (pain score 1-3) or moderate pain (pain score 4-6). Patient not taking: Reported on 02/18/2023    [provider]  Aspirin-Acetaminophen -Caffeine (EXCEDRIN PO) Take 2 tablets by mouth daily as needed.    [provider]  Cholecalciferol  (VITAMIN D3) 25 MCG (1000 UT) CAPS Take 1 capsule (1,000 Units total) by mouth daily. 12/19/22   Paseda, Folashade R, FNP  folic acid  (FOLVITE ) 1 MG tablet Take 1 tablet (1 mg total) by mouth daily. 12/19/22   Paseda, Folashade R, FNP  hydroxyurea  (HYDREA ) 100 mg/mL SUSP Take by mouth. 10/23/22   [provider]  ibuprofen  (ADVIL ) 200 MG tablet Take 200-800 mg by mouth every 6 (six) hours as needed for mild pain or headache. Patient not  taking: Reported on 02/18/2023    [provider]      Allergies    Patient has no known allergies.    Review of Systems   Review of Systems  Physical Exam Updated Vital Signs BP (!) 97/54 (BP Location: Left Arm)   Pulse 87   Temp 98.4 F (36.9 C) (Oral)   Resp 18   Ht 5\' 1"  (1.549 m)   Wt 49.9 kg   SpO2 93%   BMI 20.78 kg/m  Physical Exam Constitutional:      General: She is in acute distress.  HENT:     Head: Normocephalic and atraumatic.  Eyes:     Conjunctiva/sclera: Conjunctivae normal.     Pupils: Pupils are equal, round, and reactive to light.  Cardiovascular:     Rate and Rhythm: Normal rate and regular rhythm.  Pulmonary:     Effort: Pulmonary effort is normal. No respiratory distress.  Abdominal:     General: There is no distension.     Tenderness: There is generalized abdominal tenderness.  Skin:    General: Skin is warm and dry.  Neurological:     General: No focal deficit present.     Mental Status: She is alert. Mental status is at baseline.  Psychiatric:        Mood and Affect: Mood normal.        Behavior: Behavior normal.     ED Results / Procedures / Treatments  Labs (all labs ordered are listed, but only abnormal results are displayed) Labs Reviewed  COMPREHENSIVE METABOLIC PANEL WITH GFR - Abnormal; Notable for the following components:      Result Value   Potassium 3.2 (*)    CO2 18 (*)    Creatinine, Ser <0.30 (*)    Total Bilirubin 2.3 (*)    All other components within normal limits  RETICULOCYTES - Abnormal; Notable for the following components:   Retic Ct Pct 8.8 (*)    RBC. 1.98 (*)    Immature Retic Fract 37.0 (*)    All other components within normal limits  URINALYSIS, ROUTINE W REFLEX MICROSCOPIC - Abnormal; Notable for the following components:   Specific Gravity, Urine 1.043 (*)    All other components within normal limits  CBC WITH DIFFERENTIAL/PLATELET - Abnormal; Notable for the following components:   WBC  16.8 (*)    RBC 1.99 (*)    Hemoglobin 6.5 (*)    HCT 18.3 (*)    RDW 27.1 (*)    Platelets 469 (*)    nRBC 11.1 (*)    Neutro Abs 10.9 (*)    Monocytes Absolute 1.5 (*)    Abs Immature Granulocytes 0.49 (*)    All other components within normal limits  HCG, SERUM, QUALITATIVE  LIPASE, BLOOD  HIV ANTIBODY (ROUTINE TESTING W REFLEX)  TYPE AND SCREEN  PREPARE RBC (CROSSMATCH)    EKG None  Radiology CT ABDOMEN PELVIS W CONTRAST Result Date: 06/26/2023 CLINICAL DATA:  Abdominal pain.  Sickle cell. EXAM: CT ABDOMEN AND PELVIS WITH CONTRAST TECHNIQUE: Multidetector CT imaging of the abdomen and pelvis was performed using the standard protocol following bolus administration of intravenous contrast. RADIATION DOSE REDUCTION: This exam was performed according to the departmental dose-optimization program which includes automated exposure control, adjustment of the mA and/or kV according to patient size and/or use of iterative reconstruction technique. CONTRAST:  80mL OMNIPAQUE  IOHEXOL  300 MG/ML  SOLN COMPARISON:  CT abdomen pelvis dated 06/05/2022. FINDINGS: Lower chest: The visualized lung bases are clear. No intra-abdominal free air.  Small free fluid in the pelvis. Hepatobiliary: The liver is unremarkable. No biliary dilatation. Cholecystectomy. Pancreas: Unremarkable. No pancreatic ductal dilatation or surrounding inflammatory changes. Spleen: Splenectomy. Adrenals/Urinary Tract: The adrenal glands are unremarkable. The kidneys, visualized ureters, and urinary bladder appear unremarkable. Stomach/Bowel: There is moderate stool throughout the colon. There is no bowel obstruction or active inflammation. The appendix is normal. Vascular/Lymphatic: The abdominal aorta and IVC are unremarkable. No portal venous gas. There is no adenopathy. Reproductive: The uterus is grossly unremarkable. There is a 3.5 cm left ovarian dominant follicle or corpus luteum. No imaging follow-up. No suspicious adnexal  masses. Other: None Musculoskeletal: Osseous stigmata of sickle cell. No acute osseous pathology. IMPRESSION: 1. No acute intra-abdominal or pelvic pathology. 2. Moderate colonic stool burden. No bowel obstruction. Normal appendix. 3. A 3.5 cm left ovarian dominant follicle or corpus luteum. Electronically Signed   By: Angus Bark M.D.   On: 06/26/2023 12:03    Procedures .Critical Care  Performed by: Arvilla Birmingham, MD Authorized by: Arvilla Birmingham, MD   Critical care provider statement:    Critical care time (minutes):  30   Critical care time was exclusive of:  Separately billable procedures and treating other patients   Critical care was necessary to treat or prevent imminent or life-threatening deterioration of the following conditions:  Circulatory failure   Critical care was time spent personally by me on the  following activities:  Ordering and performing treatments and interventions, ordering and review of laboratory studies, ordering and review of radiographic studies, pulse oximetry, review of old charts, examination of patient and evaluation of patient's response to treatment Comments:     Sickle cell pain crisis management     Medications Ordered in ED Medications  HYDROmorphone  (DILAUDID ) injection 1 mg (has no administration in time range)  senna-docusate (Senokot-S) tablet 1 tablet (has no administration in time range)  polyethylene glycol (MIRALAX  / GLYCOLAX ) packet 17 g (has no administration in time range)  ketorolac  (TORADOL ) 15 MG/ML injection 15 mg (has no administration in time range)  ketorolac  (TORADOL ) 30 MG/ML injection 30 mg (30 mg Intravenous Given 06/26/23 0905)  HYDROmorphone  (DILAUDID ) injection 1 mg (1 mg Intravenous Given 06/26/23 0901)  ondansetron  (ZOFRAN ) injection 4 mg (4 mg Intravenous Given 06/26/23 0904)  sodium chloride  0.9 % bolus 500 mL (0 mLs Intravenous Stopped 06/26/23 1203)  diphenhydrAMINE  (BENADRYL ) injection 12.5 mg (12.5 mg  Intravenous Given 06/26/23 0919)  iohexol  (OMNIPAQUE ) 300 MG/ML solution 80 mL (80 mLs Intravenous Contrast Given 06/26/23 1024)  HYDROmorphone  (DILAUDID ) injection 1 mg (1 mg Intravenous Given 06/26/23 1157)  sodium chloride  0.9 % bolus 1,000 mL (1,000 mLs Intravenous New Bag/Given 06/26/23 1323)    ED Course/ Medical Decision Making/ A&P Clinical Course as of 06/26/23 1553  Wed Jun 26, 2023  1235 Spoke to sickle cell admission team to evaluate pt for admission [MT]  1235 Pt's parents had left, I attempted to reach them with her permission at all numbers listed - no answer.  Tried from pt's cell phone as well and no answer [MT]  1355 Admitted by sickle cell team [MT]    Clinical Course User Index [MT] Aubry Tucholski, Janalyn Me, MD                                 Medical Decision Making Amount and/or Complexity of Data Reviewed Labs: ordered. Radiology: ordered.  Risk Prescription drug management. Decision regarding hospitalization.   This patient presents to the ED with concern for back pain and lower abdominal pain. This involves an extensive number of treatment options, and is a complaint that carries with it a high risk of complications and morbidity.  The differential diagnosis includes sickle cell pain crisis versus appendicitis versus acute intra-abdominal process  Co-morbidities that complicate the patient evaluation: Sickle cell disease  External records from outside source obtained and reviewed including last admission for sickle cell disease in January, noting difficulty with transfusion due to antibodies, baseline hemoglobin 7-8  I ordered and personally interpreted labs.  The pertinent results include: Hemoglobin 6.5.  White blood cell count elevated  I ordered imaging studies including CT abdomen pelvis I independently visualized and interpreted imaging which showed ovarian cyst, constipation or larger stool burden, no other emergent findings I agree with the radiologist  interpretation  The patient was maintained on a cardiac monitor.  I personally viewed and interpreted the cardiac monitored which showed an underlying rhythm of: Regular heart rate  I ordered medication including IV pain and nausea medications  I have reviewed the patients home medicines and have made adjustments as needed  Test Considered: Low suspicion for PID or acute ovarian torsion  I requested consultation with the sickle cell specialty team,  and discussed lab and imaging findings as well as pertinent plan - they recommend: Transfusion and admission  After the interventions noted above,  I reevaluated the patient and found that they have: improved  Patient is stable to time of admission.  He has a physiologic low but normal blood pressure for her age and size.  I have a low suspicion for sepsis.  I suspect her white blood cell count elevation is likely related to reactive shifts from her pain and vomiting.  Disposition:  After consideration of the diagnostic results and the patients response to treatment, I feel that the patient would benefit from medical admission.         Final Clinical Impression(s) / ED Diagnoses Final diagnoses:  Symptomatic anemia  Sickle cell pain crisis [facility])    Rx / DC Orders ED Discharge Orders     None         Chanz Cahall, Janalyn Me, MD 06/26/23 1555

## 2023-06-26 NOTE — Progress Notes (Signed)
 Called by bedside RN d/t concern regarding multiple low blood pressures (SBP low 90s) w/ pt complaining of pain. Upon assessment, patient sitting upright and alert in bed, eating [employer]. BP checked by this RN; 95/54 (66). Upon chart review, patient blood pressure at outpatient appointments has run mid-low 90s-110s over 50s. PRN dilaudid  administered per order, primary RN notified.

## 2023-06-26 NOTE — Progress Notes (Addendum)
 Blood bank called and pt PRBC's not available. Will notify night shift RN. IVF started and infusing. Pt pain 6/10 and last dose of IV dilaudid  administered.  Pt requesting stronger pain medication cough medication. Providers notified of pt requesting and that pt BP is still soft and awaiting orders. Rise and fall of chest observed, call light within reach, bed in the lowest position and care continues.

## 2023-06-26 NOTE — H&P (Signed)
 H&P  Patient Demographics:  Alicia Villegas, is a 23 y.o. female  MRN: [id number]   DOB - Feb 04, 2001  Admit Date - 06/26/2023  Outpatient Primary MD for the patient is Sigurd Driver, FNP  Chief Complaint  Patient presents with   Sickle Cell Pain Crisis   Back Pain   Abdominal Pain      HPI:   Alicia Villegas  is a 23 y.o. female, with a medical history significant for sickle cell disease and anemia of chronic disease presents to the emergency department for management of his sickle cell related pain. Patient states that over the past 24 hours she has been feeling pain in her lower back that feels like her typical sickle cell pain but her lower abdomen and nausea feels different to her.  She is opiate nave and states that pain is mostly controlled on Tylenol  and Excedrin. Patient's last had these medications on last night. She says that pain has been intermittent. Patient denies any fever, chill, chest pain, presyncope/syncope, dizziness, or headache. No sick contacts or recent travel. No nausea, vomiting, diarrhea, or urinary symptoms.   Ed Course:  Patient treated with IV fluid, IV pain medication with no improvement to symptoms. HGB at 6.5 g/dl below patients baseline. Patient will be admitted for ongoing sickle cell pain management and and anemia BP (!) 97/54 (BP Location: Left Arm)  Pulse 87  Temp 98.4 F (36.9 C) (Oral)  Resp 18  Ht 5\' 1"  (1.549 m)  Wt 49.9 kg  SpO2 93%  BMI 20.78 kg/m   Potassium 3.2 (*)       CO2 18 (*)      Creatinine, Ser <0.30 (*)      Total Bilirubin 2.3 (*)      All other components within normal limits  RETICULOCYTES - Abnormal; Notable for the following components:    Retic Ct Pct 8.8 (*)      RBC. 1.98 (*)      Immature Retic Fract 37.0 (*)      All other components within normal limits  URINALYSIS, ROUTINE W REFLEX MICROSCOPIC - Abnormal; Notable for the following components:    Specific Gravity, Urine 1.043 (*)      All other components  within normal limits  CBC WITH DIFFERENTIAL/PLATELET - Abnormal; Notable for the following components:    WBC 16.8 (*)      RBC 1.99 (*)      Hemoglobin 6.5 (*)      HCT 18.3 (*)      RDW 27.1 (*)      Platelets 469 (*)      nRBC 11.1 (*)      Neutro Abs 10.9 (*)      Monocytes Absolute 1.5 (*)      Abs Immature Granulocytes 0.49 (*)       Review of systems:  In addition to the HPI above, patient reports No fever or chills No Headache, No changes with vision or hearing No problems swallowing food or liquids No chest pain, cough or shortness of breath No abdominal pain, No nausea or vomiting, Bowel movements are regular No blood in stool or urine No dysuria No new skin rashes or bruises No new joints pains-aches No new weakness, tingling, numbness in any extremity No recent weight gain or loss No polyuria, polydypsia or polyphagia No significant Mental Stressors  A full 10 point Review of Systems was done, except as stated above, all other Review of Systems were negative.  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   Migraine 03/27/2020   Sickle cell anemia ([name])    Sickle cell anemia ([name])    type SS   Sickle cell disease, type SS ([name]) 10/14/2011   Vitamin D  deficiency 10/08/2019      Past Surgical History:  Procedure Laterality Date   CHOLECYSTECTOMY     DG GALL BLADDER     HERNIA REPAIR     SPLENECTOMY     SPLENECTOMY, TOTAL       Social History:   Social History   Tobacco Use   Smoking status: Never   Smokeless tobacco: Never  Substance Use Topics   Alcohol use: No     Lives - At home   Family History :   Family History  Family history unknown: Yes     Home Medications:   Prior to Admission medications   Medication Sig Start Date End Date  Taking? Authorizing Product manager For Irrigation, Sterile (STERILE WATER)  06/07/23  Yes [provider]  acetaminophen  (TYLENOL ) 500 MG tablet Take 500-1,000 mg by mouth every 6 (six) hours as needed for mild pain (pain score 1-3) or moderate pain (pain score 4-6).    [provider]  Aspirin-Acetaminophen -Caffeine (EXCEDRIN PO) Take 2 tablets by mouth daily as needed.    [provider]  Cholecalciferol  (VITAMIN D3) 25 MCG (1000 UT) CAPS Take 1 capsule (1,000 Units total) by mouth daily. 12/19/22   Paseda, Folashade R, FNP  folic acid  (FOLVITE ) 1 MG tablet Take 1 tablet (1 mg total) by mouth daily. 12/19/22   Paseda, Folashade R, FNP  hydroxyurea  (HYDREA ) 100 mg/mL SUSP Take by mouth. 10/23/22   [provider]  ibuprofen  (ADVIL ) 200 MG tablet Take 200-800 mg by mouth every 6 (six) hours as needed for mild pain (pain score 1-3) or headache.    [provider]     Allergies:   No Known Allergies   Physical Exam:   Vitals:   Vitals:   06/26/23 1537 06/26/23 1603  BP: (!) 97/54 (!) 95/54  Pulse:  88  Resp:    Temp:    SpO2:      Physical Exam: Constitutional: Patient appears well-developed and well-nourished. Not in obvious distress. HENT: Normocephalic, atraumatic, External right and left ear normal. Oropharynx is clear and moist.  Eyes: Conjunctivae and EOM are normal. PERRLA, no scleral icterus. Neck: Normal ROM. Neck supple. No JVD. No tracheal deviation. No thyromegaly. CVS: RRR, S1/S2 +, no murmurs, no gallops, no carotid bruit.  Pulmonary: Effort and breath sounds normal, no stridor, rhonchi, wheezes, rales.  Abdominal: Soft. BS +, no distension, tenderness, rebound or guarding.  Musculoskeletal:Back pain, generalize body ache. Lymphadenopathy: No lymphadenopathy noted, cervical, inguinal or axillary Neuro: Alert. Normal reflexes, muscle tone coordination. No cranial nerve deficit. Skin: Skin is warm and dry. No rash noted. Not  diaphoretic. No erythema. No pallor. Psychiatric: Normal mood and affect. Behavior, judgment, thought content normal.   Data Review:   CBC Recent Labs  Lab 06/26/23 0857  WBC 16.8*  HGB 6.5*  HCT 18.3*  PLT 469*  MCV 92.0  MCH 32.7  MCHC 35.5  RDW 27.1*  LYMPHSABS 3.4  MONOABS 1.5*  EOSABS 0.4  BASOSABS 0.1   ------------------------------------------------------------------------------------------------------------------  Chemistries  Recent  Labs  Lab 06/26/23 0857  NA 137  K 3.2*  CL 110  CO2 18*  GLUCOSE 96  BUN 8  CREATININE <0.30*  CALCIUM 9.2  AST 22  ALT 11  ALKPHOS 71  BILITOT 2.3*   ------------------------------------------------------------------------------------------------------------------ CrCl cannot be calculated (This lab value cannot be used to calculate CrCl because it is not a number: <0.30). ------------------------------------------------------------------------------------------------------------------ No results for input(s): "TSH", "T4TOTAL", "T3FREE", "THYROIDAB" in the last 72 hours.  Invalid input(s): "FREET3"  Coagulation profile No results for input(s): "INR", "PROTIME" in the last 168 hours. ------------------------------------------------------------------------------------------------------------------- No results for input(s): "DDIMER" in the last 72 hours. -------------------------------------------------------------------------------------------------------------------  Cardiac Enzymes No results for input(s): "CKMB", "TROPONINI", "MYOGLOBIN" in the last 168 hours.  Invalid input(s): "CK" ------------------------------------------------------------------------------------------------------------------ No results found for: "BNP"  ---------------------------------------------------------------------------------------------------------------  Urinalysis    Component Value Date/Time   COLORURINE YELLOW 06/26/2023 1120    APPEARANCEUR CLEAR 06/26/2023 1120   LABSPEC 1.043 (H) 06/26/2023 1120   PHURINE 6.0 06/26/2023 1120   GLUCOSEU NEGATIVE 06/26/2023 1120   HGBUR NEGATIVE 06/26/2023 1120   BILIRUBINUR NEGATIVE 06/26/2023 1120   BILIRUBINUR negative 04/24/2022 1537   KETONESUR NEGATIVE 06/26/2023 1120   PROTEINUR NEGATIVE 06/26/2023 1120   UROBILINOGEN 1.0 04/24/2022 1537   UROBILINOGEN 1.0 01/13/2014 1612   NITRITE NEGATIVE 06/26/2023 1120   LEUKOCYTESUR NEGATIVE 06/26/2023 1120    ----------------------------------------------------------------------------------------------------------------   Imaging Results:    CT ABDOMEN PELVIS W CONTRAST Result Date: 06/26/2023 CLINICAL DATA:  Abdominal pain.  Sickle cell. EXAM: CT ABDOMEN AND PELVIS WITH CONTRAST TECHNIQUE: Multidetector CT imaging of the abdomen and pelvis was performed using the standard protocol following bolus administration of intravenous contrast. RADIATION DOSE REDUCTION: This exam was performed according to the departmental dose-optimization program which includes automated exposure control, adjustment of the mA and/or kV according to patient size and/or use of iterative reconstruction technique. CONTRAST:  80mL OMNIPAQUE  IOHEXOL  300 MG/ML  SOLN COMPARISON:  CT abdomen pelvis dated 06/05/2022. FINDINGS: Lower chest: The visualized lung bases are clear. No intra-abdominal free air.  Small free fluid in the pelvis. Hepatobiliary: The liver is unremarkable. No biliary dilatation. Cholecystectomy. Pancreas: Unremarkable. No pancreatic ductal dilatation or surrounding inflammatory changes. Spleen: Splenectomy. Adrenals/Urinary Tract: The adrenal glands are unremarkable. The kidneys, visualized ureters, and urinary bladder appear unremarkable. Stomach/Bowel: There is moderate stool throughout the colon. There is no bowel obstruction or active inflammation. The appendix is normal. Vascular/Lymphatic: The abdominal aorta and IVC are unremarkable. No  portal venous gas. There is no adenopathy. Reproductive: The uterus is grossly unremarkable. There is a 3.5 cm left ovarian dominant follicle or corpus luteum. No imaging follow-up. No suspicious adnexal masses. Other: None Musculoskeletal: Osseous stigmata of sickle cell. No acute osseous pathology. IMPRESSION: 1. No acute intra-abdominal or pelvic pathology. 2. Moderate colonic stool burden. No bowel obstruction. Normal appendix. 3. A 3.5 cm left ovarian dominant follicle or corpus luteum. Electronically Signed   By: Angus Bark M.D.   On: 06/26/2023 12:03     Assessment & Plan:  Active Problems:   Abdominal pain   Sickle cell anemia with pain ([name])   Chronic pain   Hb Sickle Cell Disease with crisis: Admit patient, start IVF 0.45% Saline @ 100 mls/hour, Oxycodone  PO, start IV Toradol  15 mg Q 6 H, Restart oral home pain medications, Monitor vitals very closely, Re-evaluate pain scale regularly, 2 L of Oxygen  by [place], Patient will be re-evaluated for pain in the context of function and relationship to baseline as care progresses. Leukocytosis: Elevated,  no acute s/s of infection, will continue to monitor Anemia of Chronic Disease: hgb at 6.5 g/dl below patients baseline. Patient will be transfused 1 unit PRBC. Will continue to monitor. Daily cbc in place Chronic pain Syndrome: Continue oral home pain medication   DVT Prophylaxis: Subcut Lovenox    AM Labs Ordered, also please review Full Orders  Family Communication: Admission, patient's condition and plan of care including tests being ordered have been discussed with the patient who indicate understanding and agree with the plan and Code Status.  Code Status: Full Code  Consults called: None    Admission status: Inpatient    Time spent in minutes : 50 minutes  Lorel Roes NP  06/26/2023 at 5:50 PM

## 2023-06-26 NOTE — ED Triage Notes (Signed)
 Pt c/o low back pain and lower abdominal pain r/t Sickle Cell crisis.  Pain score 9/10.  Pt reports taking home medications w/o relief.  Denies GU complaints.

## 2023-06-26 NOTE — Progress Notes (Addendum)
 Hi pt just admitted to the unit. Manual BP 90/50 after 1500cc bolus. Noted that pt hgb is 6.5. Pt a&ox4, pain 6/10 in her back. Pt declined and dizziness. Providers notified (see orders).   RRT called and care continues.

## 2023-06-27 LAB — CBC
HCT: 21.7 % — ABNORMAL LOW (ref 36.0–46.0)
Hemoglobin: 7.7 g/dL — ABNORMAL LOW (ref 12.0–15.0)
MCH: 32.1 pg (ref 26.0–34.0)
MCHC: 35.5 g/dL (ref 30.0–36.0)
MCV: 90.4 fL (ref 80.0–100.0)
Platelets: 396 10*3/uL (ref 150–400)
RBC: 2.4 MIL/uL — ABNORMAL LOW (ref 3.87–5.11)
RDW: 21.9 % — ABNORMAL HIGH (ref 11.5–15.5)
WBC: 10.9 10*3/uL — ABNORMAL HIGH (ref 4.0–10.5)
nRBC: 1.7 % — ABNORMAL HIGH (ref 0.0–0.2)

## 2023-06-27 LAB — TYPE AND SCREEN
ABO/RH(D): AB POS
Antibody Screen: POSITIVE
Unit division: 0

## 2023-06-27 LAB — BPAM RBC
Blood Product Expiration Date: 202506272359
ISSUE DATE / TIME: 202505212240
Unit Type and Rh: 6200

## 2023-06-27 NOTE — Discharge Summary (Addendum)
 Physician Discharge Summary  Alicia Villegas AOZ:[id number] DOB: 08-30-2000 DOA: 06/26/2023  PCP: Sigurd Driver, FNP  Admit date: 06/26/2023  Discharge date: 06/27/2023  Discharge Diagnoses:  Active Problems:   Abdominal pain   Sickle cell anemia with pain ([name])   Chronic pain   Discharge Condition: Stable  Disposition:  Pt is discharged home in good condition and is to follow up with Sigurd Driver, FNP this week to have labs evaluated. Alicia Villegas is instructed to increase activity slowly and balance with rest for the next few days, and use prescribed medication to complete treatment of pain  Diet: Regular Wt Readings from Last 3 Encounters:  06/26/23 49.9 kg  02/18/23 49.9 kg  01/09/23 49 kg    History of present illness:  Alicia Villegas  is a 23 y.o. female, with a medical history significant for sickle cell disease and anemia of chronic disease presents to the emergency department for management of his sickle cell related pain. Patient states that over the past 24 hours she has been feeling pain in her lower back that feels like her typical sickle cell pain but her lower abdomen and nausea feels different to her.  She is opiate nave and states that pain is mostly controlled on Tylenol  and Excedrin. Patient's last had these medications on last night. She says that pain has been intermittent. Patient denies any fever, chill, chest pain, presyncope/syncope, dizziness, or headache. No sick contacts or recent travel. No nausea, vomiting, diarrhea, or urinary symptoms.   Ed Course:  Patient treated with IV fluid, IV pain medication with no improvement to symptoms. HGB at 6.5 g/dl below patients baseline. Patient will be admitted for ongoing sickle cell pain management and and anemia BP (!) 97/54 (BP Location: Left Arm)  Pulse 87  Temp 98.4 F (36.9 C) (Oral)  Resp 18  Ht 5\' 1"  (1.549 m)  Wt 49.9 kg  SpO2 93%  BMI 20.78 kg/m   Potassium 3.2 (*)        CO2 18 (*)       Creatinine, Ser <0.30 (*)      Total Bilirubin 2.3 (*)      All other components within normal limits  RETICULOCYTES - Abnormal; Notable for the following components:    Retic Ct Pct 8.8 (*)      RBC. 1.98 (*)      Immature Retic Fract 37.0 (*)      All other components within normal limits  URINALYSIS, ROUTINE W REFLEX MICROSCOPIC - Abnormal; Notable for the following components:    Specific Gravity, Urine 1.043 (*)      All other components within normal limits  CBC WITH DIFFERENTIAL/PLATELET - Abnormal; Notable for the following components:    WBC 16.8 (*)      RBC 1.99 (*)      Hemoglobin 6.5 (*)      HCT 18.3 (*)      RDW 27.1 (*)      Platelets 469 (*)      nRBC 11.1 (*)      Neutro Abs 10.9 (*)      Monocytes Absolute 1.5 (*)      Abs Immature Granulocytes 0.49 (*)          Hospital Course:  Patient was admitted for sickle cell pain crisis and managed appropriately with IVF, IV Dilaudid  via PCA and IV Toradol , as well as other adjunct therapies per sickle cell pain management protocols.  Patient received 2 unit packed red  blood cells without complication.  She tolerated procedure well, hemoglobin at 7.7 g/dL.  Patient reports pain is 0/10, she is ambulating without assistance.  Mom by her bedside insists that patient is able to care for self at home. Patient was therefore discharged home today in a hemodynamically stable condition.   Alicia Villegas will follow-up with PCP within 1 week of this discharge. Alicia Villegas was counseled extensively about nonpharmacologic means of pain management, patient verbalized understanding and was appreciative of  the care received during this admission.   We discussed the need for good hydration, monitoring of hydration status, avoidance of heat, cold, stress, and infection triggers. We discussed the need to be adherent with taking other home medications. Patient was reminded of the need to seek medical attention immediately if any symptom of bleeding,  anemia, or infection occurs.  Discharge Exam: Vitals:   06/27/23 0200 06/27/23 1018  BP: (P) 101/70 99/62  Pulse: 87 87  Resp: 15   Temp: 98.1 F (36.7 C) 98.4 F (36.9 C)  SpO2: (P) 96% 99%   Vitals:   06/26/23 2234 06/26/23 2300 06/27/23 0200 06/27/23 1018  BP: (!) 90/52 96/61 (P) 101/70 99/62  Pulse: 82 80 87 87  Resp: 15 15 15    Temp: 98.6 F (37 C) 98.1 F (36.7 C) 98.1 F (36.7 C) 98.4 F (36.9 C)  TempSrc: Oral Oral Oral Oral  SpO2: 96% 99% (P) 96% 99%  Weight:      Height:        General appearance : Awake, alert, not in any distress. Speech Clear. Not toxic looking HEENT: Atraumatic and Normocephalic, pupils equally reactive to light and accomodation Neck: Supple, no JVD. No cervical lymphadenopathy.  Chest: Good air entry bilaterally, no added sounds  CVS: S1 S2 regular, no murmurs.  Abdomen: Bowel sounds present, Non tender and not distended with no gaurding, rigidity or rebound. Extremities: B/L Lower Ext shows no edema, both legs are warm to touch Neurology: Awake alert, and oriented X 3, CN II-XII intact, Non focal Skin: No Rash  Discharge Instructions  Discharge Instructions     Call MD for:  severe uncontrolled pain   Complete by: As directed    Call MD for:  temperature >100.4   Complete by: As directed    Diet - low sodium heart healthy   Complete by: As directed    Increase activity slowly   Complete by: As directed       Allergies as of 06/27/2023   No Known Allergies      Medication List     TAKE these medications    acetaminophen  500 MG tablet Commonly known as: TYLENOL  Take 500-1,000 mg by mouth every 6 (six) hours as needed for mild pain (pain score 1-3) or moderate pain (pain score 4-6).   EXCEDRIN PO Take 2 tablets by mouth daily as needed.   folic acid  1 MG tablet Commonly known as: FOLVITE  Take 1 tablet (1 mg total) by mouth daily.   hydroxyurea  100 mg/mL Susp Commonly known as: HYDREA  Take by mouth.   ibuprofen   200 MG tablet Commonly known as: ADVIL  Take 200-800 mg by mouth every 6 (six) hours as needed for mild pain (pain score 1-3) or headache.   sterile water   Vitamin D3 25 MCG (1000 UT) Caps Take 1 capsule (1,000 Units total) by mouth daily.        The results of significant diagnostics from this hospitalization (including imaging, microbiology, ancillary and laboratory) are listed below  for reference.    Significant Diagnostic Studies: CT ABDOMEN PELVIS W CONTRAST Result Date: 06/26/2023 CLINICAL DATA:  Abdominal pain.  Sickle cell. EXAM: CT ABDOMEN AND PELVIS WITH CONTRAST TECHNIQUE: Multidetector CT imaging of the abdomen and pelvis was performed using the standard protocol following bolus administration of intravenous contrast. RADIATION DOSE REDUCTION: This exam was performed according to the departmental dose-optimization program which includes automated exposure control, adjustment of the mA and/or kV according to patient size and/or use of iterative reconstruction technique. CONTRAST:  80mL OMNIPAQUE  IOHEXOL  300 MG/ML  SOLN COMPARISON:  CT abdomen pelvis dated 06/05/2022. FINDINGS: Lower chest: The visualized lung bases are clear. No intra-abdominal free air.  Small free fluid in the pelvis. Hepatobiliary: The liver is unremarkable. No biliary dilatation. Cholecystectomy. Pancreas: Unremarkable. No pancreatic ductal dilatation or surrounding inflammatory changes. Spleen: Splenectomy. Adrenals/Urinary Tract: The adrenal glands are unremarkable. The kidneys, visualized ureters, and urinary bladder appear unremarkable. Stomach/Bowel: There is moderate stool throughout the colon. There is no bowel obstruction or active inflammation. The appendix is normal. Vascular/Lymphatic: The abdominal aorta and IVC are unremarkable. No portal venous gas. There is no adenopathy. Reproductive: The uterus is grossly unremarkable. There is a 3.5 cm left ovarian dominant follicle or corpus luteum. No imaging  follow-up. No suspicious adnexal masses. Other: None Musculoskeletal: Osseous stigmata of sickle cell. No acute osseous pathology. IMPRESSION: 1. No acute intra-abdominal or pelvic pathology. 2. Moderate colonic stool burden. No bowel obstruction. Normal appendix. 3. A 3.5 cm left ovarian dominant follicle or corpus luteum. Electronically Signed   By: Angus Bark M.D.   On: 06/26/2023 12:03    Microbiology: No results found for this or any previous visit (from the past 240 hours).   Labs: Basic Metabolic Panel: Recent Labs  Lab 06/26/23 0857  NA 137  K 3.2*  CL 110  CO2 18*  GLUCOSE 96  BUN 8  CREATININE <0.30*  CALCIUM 9.2   Liver Function Tests: Recent Labs  Lab 06/26/23 0857  AST 22  ALT 11  ALKPHOS 71  BILITOT 2.3*  PROT 7.7  ALBUMIN 4.2   Recent Labs  Lab 06/26/23 0857  LIPASE 37   No results for input(s): "AMMONIA" in the last 168 hours. CBC: Recent Labs  Lab 06/26/23 0857 06/27/23 1050  WBC 16.8* 10.9*  NEUTROABS 10.9*  --   HGB 6.5* 7.7*  HCT 18.3* 21.7*  MCV 92.0 90.4  PLT 469* 396   Cardiac Enzymes: No results for input(s): "CKTOTAL", "CKMB", "CKMBINDEX", "TROPONINI" in the last 168 hours. BNP: Invalid input(s): "POCBNP" CBG: No results for input(s): "GLUCAP" in the last 168 hours.  Time coordinating discharge: 50 minutes  Signed:  Lorel Roes NP  [facility] 06/28/2023, 9:25 AM  Evaluation and management procedures were performed by the Advanced Practitioner under my supervision and collaboration. I have reviewed the Advanced Practitioner's note and chart, and I agree with the management and plan.   Carliss Porcaro, MD, MHA, CPE, [facility] [place], [place] [phone] 06/28/2023, 1:14 PM

## 2023-06-27 NOTE — Plan of Care (Signed)
 One unit PRBC transfused this shift. No apparent transfusion reaction noted.   Problem: Tissue Perfusion: Goal: Complications related to inadequate tissue perfusion will be avoided or minimized Outcome: Progressing   Problem: Respiratory: Goal: Pulmonary complications will be avoided or minimized Outcome: Progressing   Problem: Education: Goal: Knowledge of General Education information will improve Description: Including pain rating scale, medication(s)/side effects and non-pharmacologic comfort measures Outcome: Progressing   Problem: Activity: Goal: Risk for activity intolerance will decrease Outcome: Progressing   Problem: Pain Managment: Goal: General experience of comfort will improve and/or be controlled Outcome: Progressing   Problem: Safety: Goal: Ability to remain free from injury will improve Outcome: Progressing

## 2023-06-27 NOTE — Plan of Care (Signed)
  Problem: Education: Goal: Knowledge of vaso-occlusive preventative measures will improve Outcome: Progressing Goal: Awareness of infection prevention will improve Outcome: Progressing Goal: Awareness of signs and symptoms of anemia will improve Outcome: Progressing Goal: Long-term complications will improve Outcome: Progressing   Problem: Self-Care: Goal: Ability to incorporate actions that prevent/reduce pain crisis will improve Outcome: Progressing   Problem: Bowel/Gastric: Goal: Gut motility will be maintained Outcome: Progressing   Problem: Tissue Perfusion: Goal: Complications related to inadequate tissue perfusion will be avoided or minimized Outcome: Progressing   Problem: Respiratory: Goal: Pulmonary complications will be avoided or minimized Outcome: Progressing Goal: Acute Chest Syndrome will be identified early to prevent complications Outcome: Progressing   Problem: Clinical Measurements: Goal: Ability to maintain clinical measurements within normal limits will improve Outcome: Progressing Goal: Will remain free from infection Outcome: Progressing Goal: Diagnostic test results will improve Outcome: Progressing Goal: Respiratory complications will improve Outcome: Progressing Goal: Cardiovascular complication will be avoided Outcome: Progressing   Problem: Activity: Goal: Risk for activity intolerance will decrease Outcome: Progressing   Problem: Nutrition: Goal: Adequate nutrition will be maintained Outcome: Progressing   Problem: Skin Integrity: Goal: Risk for impaired skin integrity will decrease Outcome: Progressing

## 2023-06-28 ENCOUNTER — Telehealth (HOSPITAL_COMMUNITY): Payer: Self-pay | Admitting: *Deleted

## 2023-06-28 ENCOUNTER — Non-Acute Institutional Stay (HOSPITAL_COMMUNITY)
Admission: AD | Admit: 2023-06-28 | Discharge: 2023-06-28 | Disposition: A | Discharge status: Home / Self Care | Source: Ambulatory Visit | Attending: Internal Medicine | Admitting: Internal Medicine

## 2023-06-28 DIAGNOSIS — Z9081 Acquired absence of spleen: Secondary | ICD-10-CM | POA: Diagnosis not present

## 2023-06-28 DIAGNOSIS — D5700 Hb-SS disease with crisis, unspecified: Secondary | ICD-10-CM | POA: Insufficient documentation

## 2023-06-28 DIAGNOSIS — Z79899 Other long term (current) drug therapy: Secondary | ICD-10-CM | POA: Insufficient documentation

## 2023-06-28 DIAGNOSIS — Z7982 Long term (current) use of aspirin: Secondary | ICD-10-CM | POA: Insufficient documentation

## 2023-06-28 MED ORDER — ACETAMINOPHEN 500 MG PO TABS
1000.0000 mg | ORAL_TABLET | Freq: Once | ORAL | Status: AC
  Administered 2023-06-28: 1000 mg via ORAL
  Filled 2023-06-28: qty 2

## 2023-06-28 MED ORDER — PROMETHAZINE HCL 12.5 MG RE SUPP
12.5000 mg | RECTAL | Status: DC | PRN
  Filled 2023-06-28: qty 2

## 2023-06-28 MED ORDER — HYDROMORPHONE HCL 1 MG/ML PO LIQD: 2.0000 mg | Freq: Four times a day (QID) | ORAL | Status: DC | PRN

## 2023-06-28 MED ORDER — POLYETHYLENE GLYCOL 3350 17 G PO PACK: 17.0000 g | PACK | Freq: Every day | ORAL | Status: DC | PRN

## 2023-06-28 MED ORDER — HYDROMORPHONE HCL 1 MG/ML IJ SOLN
1.0000 mg | INTRAMUSCULAR | Status: DC | PRN
Start: 2023-06-28 — End: 2023-06-28

## 2023-06-28 MED ORDER — HYDROMORPHONE HCL 1 MG/ML IJ SOLN
1.0000 mg | INTRAMUSCULAR | Status: AC
  Administered 2023-06-28 (×2): 1 mg via SUBCUTANEOUS
  Filled 2023-06-28 (×2): qty 1

## 2023-06-28 MED ORDER — SENNOSIDES-DOCUSATE SODIUM 8.6-50 MG PO TABS: 1.0000 | ORAL_TABLET | Freq: Two times a day (BID) | ORAL | Status: DC

## 2023-06-28 MED ORDER — HYDROMORPHONE HCL 2 MG PO TABS: 2.0000 mg | ORAL_TABLET | ORAL | 0 refills | Status: AC | PRN

## 2023-06-28 MED ORDER — PROMETHAZINE HCL 25 MG PO TABS: 12.5000 mg | ORAL_TABLET | ORAL | Status: DC | PRN

## 2023-06-28 MED ORDER — OXYCODONE HCL 5 MG PO TABS: 5.0000 mg | ORAL_TABLET | ORAL | Status: DC | PRN

## 2023-06-28 NOTE — H&P (Addendum)
 [facility] History and Physical  Alicia Villegas [id number] DOB: [birth year] DOA: 06/28/2023  PCP: Alicia Driver, FNP   Chief Complaint:  Chief Complaint  Patient presents with   Sickle Cell Pain Crisis    HPI: Alicia Villegas is a 23 y.o. female with history of sickle cell disease, presents to the day clinic for management of her sickle cell related pain. Patient states that over the past 24 hours she has been feeling pain in her lower back that feels like her typical sickle cell pain. She is reporting pain of 8/10 with no precipitating factor.   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 Authorizing Provider  acetaminophen  (TYLENOL ) 500 MG tablet Take 500-1,000 mg by mouth every 6 (six) hours as needed for mild pain (pain score 1-3) or moderate pain (pain score 4-6).    Yes [provider]  hydroxyurea  (HYDREA ) 100 mg/mL SUSP Take by mouth. 10/23/22  Yes [provider]  ibuprofen  (ADVIL ) 200 MG tablet Take 200-800 mg by mouth every 6 (six) hours as needed for mild pain (pain score 1-3) or headache.   Yes [provider]  Aspirin-Acetaminophen -Caffeine (EXCEDRIN PO) Take 2 tablets by mouth daily as needed.    [provider]  Cholecalciferol  (VITAMIN D3) 25 MCG (1000 UT) CAPS Take 1 capsule (1,000 Units total) by mouth daily. 12/19/22   Villegas, Alicia R, FNP  folic acid  (FOLVITE ) 1 MG tablet Take 1 tablet (1 mg total) by mouth daily. 12/19/22   Villegas, Alicia R, FNP  Water For Irrigation, Sterile (STERILE WATER)  06/07/23   [provider]     Physical Exam: Vitals:   06/28/23 0904 06/28/23 1109 06/28/23 1112 06/28/23 1318  BP: (!) 103/54 (!) 87/52 98/64 100/66  Pulse: 86 74  88  Resp: 14 16  16   Temp: 98 F (36.7 C) 98 F (36.7 C)    TempSrc: Temporal Temporal    SpO2: 97% 95%  94%    General: Alert, awake, afebrile, anicteric, not in obvious distress HEENT: Normocephalic and Atraumatic, Mucous membranes pink                PERRLA; EOM intact; No scleral icterus,                 Nares: Patent, Oropharynx: Clear, Fair Dentition  Neck: FROM, no cervical lymphadenopathy, thyromegaly, carotid bruit or JVD;  CHEST WALL: No tenderness  CHEST: Normal respiration, clear to auscultation bilaterally  HEART: Regular rate and rhythm; no murmurs rubs or gallops  BACK: No kyphosis or scoliosis; no CVA tenderness  ABDOMEN: Positive Bowel Sounds, soft, non-tender; no masses, no organomegaly EXTREMITIES: No cyanosis, clubbing, or edema SKIN:  no rash or ulceration  CNS: Alert and Oriented x 4, Nonfocal exam, CN 2-12 intact  Labs on Admission:  Basic Metabolic Panel: Recent Labs  Lab 06/26/23 0857  NA 137  K 3.2*  CL 110  CO2 18*  GLUCOSE 96  BUN 8  CREATININE <0.30*  CALCIUM 9.2    Liver Function Tests: Recent Labs  Lab 06/26/23 0857  AST 22  ALT 11  ALKPHOS 71  BILITOT 2.3*  PROT 7.7  ALBUMIN 4.2   Recent Labs  Lab 06/26/23 0857  LIPASE 37   No results for input(s): "AMMONIA" in the last 168 hours. CBC: Recent Labs  Lab 06/26/23 0857 06/27/23 1050  WBC 16.8* 10.9*  NEUTROABS 10.9*  --   HGB 6.5* 7.7*  HCT 18.3* 21.7*  MCV 92.0 90.4  PLT 469* 396   Cardiac Enzymes: No results for input(s): "CKTOTAL", "CKMB", "CKMBINDEX", "TROPONINI" in the last 168 hours.  BNP (last 3 results) No results for input(s): "BNP" in the last 8760 hours.  ProBNP (last 3 results) No results for input(s): "PROBNP" in the last 8760 hours.  CBG: No results for input(s): "GLUCAP" in the last 168 hours.   Assessment/Plan Principal Problem:   Sickle-cell disease with pain ([name])  Admits to the Day Hospital for extended observation Weight based Dilaudid  subqu started within 30 minutes of admission Acetaminophen  1000 mg x 1 dose Monitor vitals very closely, Re-evaluate pain scale every hour 2 L of Oxygen  by [place] Patient will be re-evaluated for pain in the context of function and relationship to baseline as care progresses. If no significant relieve from pain (remains above 5/10) will transfer patient to inpatient services for further evaluation and management  Code Status: Full  Family Communication: None  DVT Prophylaxis: Ambulate as tolerated   Time spent: 35 Minutes  Alicia Roes NP  If 7PM-7AM, please contact night-coverage [email or web address] 06/28/2023, 2:19 PM

## 2023-06-28 NOTE — Telephone Encounter (Signed)
 Patient called requesting to come to the day hospital for sickle cell pain. Patient reports lower back and abdominal pain rated 7/10. Patient was admitted to [facility] hospital 2 days ago and discharged yesterday. Denies fever, chest pain, nausea, vomiting and diarrhea. Patient arrived to the day hospital before we could call her back. Onyeje, NP spoke with patient and advised that patient can come to the day hospital to receive PO pain medications. Patient advised.

## 2023-06-28 NOTE — Progress Notes (Signed)
 Pt admitted to the day hospital today for sickle pain treatment. On arrival, pt rates 6/10 pain to lower back and abdomen. Pt received Dilaudid  1 mg subcutaneous injections every 1 hour x 2 doses. Pt also received PO Tylenol  1,000 mg. At discharge, pt rates pain 5/10. AVS offered, but pt declined. Pt is alert, oriented, and ambulatory at discharge.

## 2023-06-28 NOTE — Discharge Summary (Signed)
 Physician Discharge Summary  Alicia Villegas [id number] DOB: Feb 03, 2001 DOA: 06/28/2023  PCP: Sigurd Driver, FNP  Admit date: 06/28/2023  Discharge date: 06/28/2023  Time spent: 30 minutes  Discharge Diagnoses:  Principal Problem:   Sickle-cell disease with pain [facility])   Discharge Condition: Stable  Diet recommendation: Regular  History of present illness:   Alicia Villegas is a 23 y.o. female with history of sickle cell disease, presents to the day clinic for management of her sickle cell related pain. Patient states that over the past 24 hours she has been feeling pain in her lower back that feels like her typical sickle cell pain. She is reporting pain of 8/10 with no precipitating factor.     Hospital Course:  Alicia Villegas was admitted to the day hospital with sickle cell painful crisis. Patient was treated with IV fluid, weight based IV Dilaudid  PCA, IV Toradol , clinician assisted doses as deemed appropriate, and other adjunct therapies per sickle cell pain management protocol. Alicia Villegas showed significant improvement symptomatically, pain improved from 8/10 to 5/10 at the time of discharge. Patient was discharged home in a hemodynamically stable condition. Alicia Villegas will follow-up at the clinic as previously scheduled, continue with home medications as per prior to admission.  Discharge Instructions We discussed the need for good hydration, monitoring of hydration status, avoidance of heat, cold, stress, and infection triggers. We discussed the need to be compliant with taking Hydrea  and other home medications. Alicia Villegas was reminded of the need to seek medical attention immediately if any symptom of bleeding, anemia, or infection occurs.  Discharge Exam: Vitals:   06/28/23 1318 06/28/23 1543  BP: 100/66 (!) 98/58  Pulse: 88 67  Resp: 16 16  Temp:    SpO2: 94% 97%    General appearance: alert, cooperative and no distress Eyes: conjunctivae/corneas clear. PERRL, EOM's intact. Fundi  benign. Neck: no adenopathy, no carotid bruit, no JVD, supple, symmetrical, trachea midline and thyroid not enlarged, symmetric, no tenderness/mass/nodules Back: symmetric, no curvature. ROM normal. No CVA tenderness. Resp: clear to auscultation bilaterally Chest wall: no tenderness Cardio: regular rate and rhythm, S1, S2 normal, no murmur, click, rub or gallop GI: soft, non-tender; bowel sounds normal; no masses,  no organomegaly Extremities: extremities normal, atraumatic, no cyanosis or edema Pulses: 2+ and symmetric Skin: Skin color, texture, turgor normal. No rashes or lesions Neurologic: Grossly normal  Discharge Instructions     Call MD for:  severe uncontrolled pain   Complete by: As directed    Call MD for:  temperature >100.4   Complete by: As directed    Diet - low sodium heart healthy   Complete by: As directed    Increase activity slowly   Complete by: As directed       Allergies as of 06/28/2023   No Known Allergies      Medication List     TAKE these medications    acetaminophen  500 MG tablet Commonly known as: TYLENOL  Take 500-1,000 mg by mouth every 6 (six) hours as needed for mild pain (pain score 1-3) or moderate pain (pain score 4-6).   EXCEDRIN PO Take 2 tablets by mouth daily as needed.   folic acid  1 MG tablet Commonly known as: FOLVITE  Take 1 tablet (1 mg total) by mouth daily.   HYDROmorphone  2 MG tablet Commonly known as: Dilaudid  Take 1 tablet (2 mg total) by mouth every 4 (four) hours as needed for severe pain (pain score 7-10).   hydroxyurea  100 mg/mL Susp Commonly known  as: HYDREA  Take by mouth.   ibuprofen  200 MG tablet Commonly known as: ADVIL  Take 200-800 mg by mouth every 6 (six) hours as needed for mild pain (pain score 1-3) or headache.   sterile water   Vitamin D3 25 MCG (1000 UT) Caps Take 1 capsule (1,000 Units total) by mouth daily.       No Known Allergies   Significant Diagnostic Studies: CT ABDOMEN PELVIS  W CONTRAST Result Date: 06/26/2023 CLINICAL DATA:  Abdominal pain.  Sickle cell. EXAM: CT ABDOMEN AND PELVIS WITH CONTRAST TECHNIQUE: Multidetector CT imaging of the abdomen and pelvis was performed using the standard protocol following bolus administration of intravenous contrast. RADIATION DOSE REDUCTION: This exam was performed according to the departmental dose-optimization program which includes automated exposure control, adjustment of the mA and/or kV according to patient size and/or use of iterative reconstruction technique. CONTRAST:  80mL OMNIPAQUE  IOHEXOL  300 MG/ML  SOLN COMPARISON:  CT abdomen pelvis dated 06/05/2022. FINDINGS: Lower chest: The visualized lung bases are clear. No intra-abdominal free air.  Small free fluid in the pelvis. Hepatobiliary: The liver is unremarkable. No biliary dilatation. Cholecystectomy. Pancreas: Unremarkable. No pancreatic ductal dilatation or surrounding inflammatory changes. Spleen: Splenectomy. Adrenals/Urinary Tract: The adrenal glands are unremarkable. The kidneys, visualized ureters, and urinary bladder appear unremarkable. Stomach/Bowel: There is moderate stool throughout the colon. There is no bowel obstruction or active inflammation. The appendix is normal. Vascular/Lymphatic: The abdominal aorta and IVC are unremarkable. No portal venous gas. There is no adenopathy. Reproductive: The uterus is grossly unremarkable. There is a 3.5 cm left ovarian dominant follicle or corpus luteum. No imaging follow-up. No suspicious adnexal masses. Other: None Musculoskeletal: Osseous stigmata of sickle cell. No acute osseous pathology. IMPRESSION: 1. No acute intra-abdominal or pelvic pathology. 2. Moderate colonic stool burden. No bowel obstruction. Normal appendix. 3. A 3.5 cm left ovarian dominant follicle or corpus luteum. Electronically Signed   By: Angus Bark M.D.   On: 06/26/2023 12:03    Signed:  Lorel Roes NP  06/28/2023, 4:14 PM

## 2023-09-02 DIAGNOSIS — Z8619 Personal history of other infectious and parasitic diseases: Secondary | ICD-10-CM | POA: Diagnosis not present

## 2023-09-02 DIAGNOSIS — R0789 Other chest pain: Secondary | ICD-10-CM | POA: Diagnosis not present

## 2023-09-02 DIAGNOSIS — R911 Solitary pulmonary nodule: Secondary | ICD-10-CM | POA: Diagnosis not present

## 2023-09-02 DIAGNOSIS — D5700 Hb-SS disease with crisis, unspecified: Secondary | ICD-10-CM | POA: Diagnosis not present

## 2023-09-02 DIAGNOSIS — Z9081 Acquired absence of spleen: Secondary | ICD-10-CM | POA: Diagnosis not present
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
## 2023-09-02 NOTE — ED Provider Notes (Signed)
 Emergency Department Physician Note  HPI/ROS  Sickle Cell Pain Crisis    This is a 23 y.o. female with past medical history of sickle cell disease (follows w/ [facility] and heme onc) presenting with leg pain that started 8 hours before arrival.  Reports history of sickle cell disease.  Denies any injury.  Reports pain is similar to prior episodes of sickle cell pain.  On arrival patient also endorses developing substernal chest pain, reports similar to prior history of acute chest syndrome.  Denies any nausea or vomiting.  Denies any injury to her leg.  Denies any fever.  Denies any recent illnesses.  Reports last took Motrin , has not taken Oxy yet for pain.  Previously was done along with adding Motrin .  Physical exam is notable for alert, acute distress secondary to pain, actively rolling around on the bed secondary to pain.  Tachycardic, regular rhythm.  Lungs are clear to auscultation throughout.  Abdomen flat, soft, nondistended, nontender to palpation.  Full ROM bilateral lower extremities at hip, knees, and ankles.  No appreciable swelling of joints, no warmth, nontender to palpation.  No lacerations or abrasions appreciated.  Differential Diagnosis: Sickle cell pain crisis, acute chest, pneumonia, pneumonitis, ACS, PE, dehydration.  Medical Decision Making   Initial plan is to obtain lab workup, imaging.  Dilaudid  for pain, Toradol , Benadryl  as patient usually gets itching with pain management, fluids.  Medications:  Medications  ketorolac  (TORADOL ) injection 15 mg (15 mg intravenous Given 09/02/23 1535)  methocarbamoL  (ROBAXIN ) injection 1,000 mg (1,000 mg intravenous Given 09/02/23 1534)  lidocaine (XYLOCAINE) 5 % ointment ( topical Given 09/02/23 1535)  diclofenac sodium (VOLTAREN) 1 % gel 4 g (has no administration in time range)  morphine  injection 8 mg (has no administration in time range)  HYDROmorphone  (DILAUDID ) 1 mg/mL injection 1 mg (1 mg intravenous Given  09/02/23 0757)  lactated ringer 's (bolus) bolus 1,000 mL (0 mL intravenous Stopped 09/02/23 0929)  diphenhydrAMINE  (BENADRYL ) injection 25 mg (25 mg intravenous Given 09/02/23 0825)  ketorolac  (TORADOL ) injection 15 mg (15 mg intravenous Given 09/02/23 0825)  iohexoL  (OMNIPAQUE ) 350 mg iodine/mL injection (SDV) 80 mL (80 mL intravenous Given 09/02/23 0946)  HYDROmorphone  (DILAUDID ) 1 mg/mL injection 1 mg (1 mg intravenous Given 09/02/23 1019)  ketamine (KETALAR) 10 mg/mL injection 13.5 mg (13.5 mg intravenous Given 09/02/23 1308)    EKG: I interpreted the ECG. It reveals a sinus rhythm. The QTc, PR, and QRS are appropriate. There are no signs of acute ischemia or of significant electrical abnormalities. The ECG does not show a STEMI. There are no ST depressions. There are no T wave inversions. There is no evidence of a High-Grade Conduction Block.  Similar in comparison to EKG performed 04/03/2022  Labs notable for WBC 13.9, Hgb 7.2 (baseline 7.5), platelet count 525, ANC 9.9, troponin 6, repeat 7.  D-dimer 20,850, beta-hCG negative, LDH 544, CMP WNL, reticulocyte count percent 6.2%, absolute reticulocyte 141.6.  Imaging reviewed by me and notable for  Radiology Results (last 72 hours)     Procedure Component Value Units Date/Time   CT Angio Chest Pulmonary Embolism [[id number]] Collected: 09/02/23 0949   Impression:     1.  No evidence of pulmonary embolism. 2.  Scattered left-sided perihilar calcified pulmonary nodules which can be seen with sequelae of benign granulomatous disease.   XR Chest 1 View [[id number]] Collected: 09/02/23 0659   Impression:     No evidence of acute chest syndrome.  Based on patient presentation, history, evaluation, concern for sickle cell crisis with severe pain.  Patient given IVF, as well as multiple dosages of pain medication including multiple doses of Dilaudid , Toradol , and pain dose ketamine.  Patient continued to have significant pain, poorly  controlled while in the ED.  Overall with these findings, patient will require admission for further pain management for sickle cell crisis.  Imaging negative for signs of infection, patient has remained hemodynamically stable, with IV fluid resuscitation tachycardia is resolved, I have lower suspicion for acute chest syndrome at this time, as well as pneumonia versus pneumonitis.  Imaging negative for PE.  Overall, plan for admission for pain management for sickle cell crisis.  Discussed with the hematology and oncology team, agreeable to admission.  ADMIT: Current history and diagnostic evaluation consistent with sickle cell crisis with poor pain control requiring admission to heme-onc service for pain control. Patient was stabilized was not requiring any further acute emergent intervention in the ED.   ED Course as of 09/02/23 1650  Mon Sep 02, 2023  1006 Patient requesting repeat dosing for pain meds [BS]  1511 S. Sickle cell pain crisis. Chest pain and LE pain. ARTM [AD]    ED Course User Index [AD] Allison May Dombroski, MD [BS] Morene Agent Skinner, DO   No data recorded 1. Sickle cell crisis    (CMD)     ED Disposition:  Observation  The plan for this patient was discussed with Dr. Almond, who voiced agreement and who oversaw evaluation and treatment of this patient.    Physical Exam   Vitals:   09/02/23 1038 09/02/23 1535 09/02/23 1604 09/02/23 1634  BP:  (!) 100/59 98/62 (!) 89/54  BP Location:      Patient Position:   Lying   Pulse: 85 99 92 91  Resp: 17 (!) 22 20 20   Temp:  99.8 F (37.7 C)    TempSrc:  Oral    SpO2: 97% 96% 93% 93%  Weight:      Height:        Physical Exam Vitals and nursing note reviewed.  Constitutional:      General: She is in acute distress.     Appearance: She is ill-appearing.  HENT:     Head: Normocephalic and atraumatic.     Mouth/Throat:     Mouth: Mucous membranes are dry.     Pharynx: Oropharynx is clear.   Eyes:      Extraocular Movements: Extraocular movements intact.     Conjunctiva/sclera: Conjunctivae normal.     Pupils: Pupils are equal, round, and reactive to light.    Cardiovascular:     Rate and Rhythm: Regular rhythm. Tachycardia present.     Pulses: Normal pulses.     Heart sounds: Normal heart sounds. No murmur heard.    No gallop.  Pulmonary:     Effort: Pulmonary effort is normal. No respiratory distress.     Breath sounds: Normal breath sounds. No stridor. No wheezing, rhonchi or rales.  Chest:     Chest wall: No tenderness.  Abdominal:     General: Abdomen is flat. There is no distension.     Palpations: Abdomen is soft.     Tenderness: There is no abdominal tenderness. There is no right CVA tenderness, left CVA tenderness, guarding or rebound.   Musculoskeletal:        General: No swelling, tenderness, deformity or signs of injury. Normal range of motion.     Cervical  back: Neck supple.     Right lower leg: No edema.     Left lower leg: No edema.   Skin:    General: Skin is warm.     Capillary Refill: Capillary refill takes 2 to 3 seconds.   Neurological:     Mental Status: She is alert and oriented to person, place, and time. Mental status is at baseline.      MDM generated using voice dictation software and may contain dictation errors. Please contact me for any clarification or with any questions.   Electronically signed by:  Morene Barrio, DO 09/02/2023 7:14 AM  I performed a history and physical examination of the patient and discussed their management with the resident/APP.  I agree with the history, physical, assessment, and plan of care, with the following exceptions: None

## 2023-09-03 DIAGNOSIS — D5700 Hb-SS disease with crisis, unspecified: Secondary | ICD-10-CM | POA: Diagnosis not present

## 2023-09-04 DIAGNOSIS — D5700 Hb-SS disease with crisis, unspecified: Secondary | ICD-10-CM | POA: Diagnosis not present

## 2023-09-04 NOTE — Progress Notes (Addendum)
 Hospitalist Daily Progress Note     Date: 09/04/2023        Room: C716/A Hospital Day: 1 Attending Physician: Christine Myron, MD  Subjective / Interval History / ROS  Feels better Eating good. Blood helped. Wants PCA for today. All other ROS is negative. Her cold symptoms are resolving. She is studying [employer].    Assessment and Plan  Alicia Villegas is a 23 y.o. female with history significant for sickle cell anemia, splenectomy and Cholecystectomy and multiple admissions for sickle cell crisis who presents with chest and R leg pain concerning for sickle cell crisis.    # Sickle cell pain crisis # Anemia - CT Chest neg for PE. - Likely triggered by URTI, heat and exertion. No concern for active infection at present. -IV hydration w/ D5 1/2NS -Pain control w/ multimodal therapy: scheduled APAP, NSAID, muscle relaxant, topical lidocaine/diclofenac.  -Opioid therapy w/ morphine  PCA, plan to de-escalate to PO opioid after 24-48 hours. -Home hydroxyurea , folic acid . -Transfused a unit of blood without issues on 7/29   Per prior DC summary - Baseline Hb 7-8. Lot of antibodies to transfuse  Hypomagnesemia: MgO daily   Principal Problem:   Sickle cell anemia with pain    (CMD) Active Problems:   Sickle cell crisis    (CMD) Resolved Problems:   * No resolved hospital problems. *     The primary contact is:  Extended Emergency Contact Information Primary Emergency Contact: Strader,[name]  [place] Mobile Phone: [phone] Relation: Other Secondary Emergency Contact: Imatha,Winsor Address: [address]          [place], [place] [address] [place] Relation: Father   Code Status: Full Code    DVT ppx: SQ Enoxaparin   Current Diet: Adult Diet- Regular   Discharge Planning  The current disposition plan is discharge to Home in 24 hours. Barriers to discharge: PCA pump Discharge follow up  needed:      Objective   Temp:  [98.2 F (36.8 C)-98.4 F (36.9 C)] 98.4 F (36.9 C) Heart Rate:  [72-89] 88 Resp:  [16-18] 17 BP: (104-108)/(58-69) 108/69   Intake/Output Summary (Last 24 hours) at 09/04/2023 2000 Last data filed at 09/04/2023 1956 Gross per 24 hour  Intake 830 ml  Output 0 ml  Net 830 ml      AOx3 Walking to the bathroom Lungs Clear CVS:S1S2 heard Abdomen: NABS,non distended,non tender,no masses MSK: No pedal edema   Lines/Drains: Patient Lines/Drains/Airways Status     Active Peripherally inserted central catheter / Central venous catheter / Peripheral intravenous line / Drain / Airway / Intraosseous line / Epidural catheter / Arterial line / Line / Nasogastric/Orogastric Tube / Hemodialysis catheter / Umbilical venous catheter / Urethral Catheter / Intrauterine Pressure Catheter / Implantable Port / NHSN Urethral Catheter / NHSN Umbilical Catheter / Intentionally Retained Foreign Objects / AV Fistula / Peripheral Nerve Catheter     Name Placement date Placement time Site Days   Peripheral IV 09/03/23 20 G Anterior;Left Forearm 09/03/23  0605  Forearm  1   Peripheral IV 09/03/23 20 G Anterior;Right;Upper Arm 09/03/23  1242  Arm  1             Labs and Results  I have reviewed the following labs and studies Lab Results  Component Value Date   WBC 10.00 09/04/2023   HGB 7.6 (L) 09/04/2023   HCT 21.0 (L) 09/04/2023   MCV 87.4 09/04/2023   PLT 444 09/04/2023  Lab Results  Component Value Date   GLUCOSE 82 09/04/2023   CALCIUM 7.9 (L) 09/04/2023   NA 137 09/04/2023   K 4.1 09/04/2023   CO2 22 09/04/2023   CL 112 (H) 09/04/2023   BUN 5 (L) 09/04/2023   CREATININE 0.33 (L) 09/04/2023   Lab Results  Component Value Date   ALT 6 (L) 09/02/2023   AST 34 09/02/2023   BILITOT 1.5 (H) 09/02/2023   No results found for: INR, PROTIME  Micro: No results found for this visit on 09/02/23 (from the past week).      Medications   Scheduled Medications: acetaminophen , 1,000 mg, oral, TID diclofenac sodium, 4 g, topical, BID enoxaparin , 40 mg, subcutaneous, At Bedtime folic acid , 1 mg, oral, Daily hydroxyUREA , 1,200 mg, oral, Daily ketorolac , 15 mg, intravenous, Q6H SCH lidocaine, , topical, TID polyethylene glycol, 17 g, oral, Daily sennosides-docusate sodium , 1 tablet, oral, BID   Prn Medications: .  dextrose  .  dextrose  .  diphenhydrAMINE  .  guaiFENesin .  melatonin .  naloxone  .  naloxone  .  ondansetron  .  prochlorperazine Continuous Medications: .morphine  1 mg/mL PCA,  sodium chloride , 125 mL/hr, Last Rate: 125 mL/hr (09/04/23 1616)      Electronically signed by: Christine Myron, MD 09/04/2023 8:00 PM

## 2023-09-05 DIAGNOSIS — D5700 Hb-SS disease with crisis, unspecified: Secondary | ICD-10-CM | POA: Diagnosis not present

## 2023-09-08 DIAGNOSIS — R079 Chest pain, unspecified: Secondary | ICD-10-CM | POA: Diagnosis not present

## 2023-09-09 DIAGNOSIS — D571 Sickle-cell disease without crisis: Secondary | ICD-10-CM | POA: Diagnosis not present

## 2023-09-15 NOTE — Discharge Summary (Addendum)
 Hospitalist  Discharge Summary   Name: Alicia Villegas Age: 23 yrs  MRN: [id number] DOB: 2000-04-26  Admit Date: 09/02/2023 Admitting Physician: Fonda KATHEE Louder, MD  Discharge Date: 09/15/2023 Discharge Physician: Christine Shan COME    Admission Diagnosis:   Sickle cell crisis    (CMD) [D57.00] Sickle cell anemia with pain    (CMD) [D57.00]   Discharge Diagnoses:   Principal Problem:   Sickle cell anemia with pain    (CMD) Resolved Problems:   Sickle cell crisis    (CMD)   TO DO List at Follow-up for PCP/Specialist:   Key Medication changes:  Pending labs to follow up on:  Incidental findings requiring follow-up:  Other: f/u Hematology as an OP  Spoke with sister and patient at bedside. Received a communication that she has trouble with transport.  There was a big back and forth between pharmacy, patient, RN ane me. She was taking Syrup in the past and does not have any medication. Syrup is not available in our pharmacy and she has trouble with transport to get meds. I went and spoke with her in the room again and she was ok swallowing hte pill. So switched it to tablet to be delivered to the room.   Hospital Course:   For full details, please see H&P, progress notes, consult notes and ancillary notes. Briefly,   Alicia Tyaira Heward is a 23 y.o. female with history significant for sickle cell anemia, splenectomy,cholecystectomy and multiple admissions for sickle cell crisis who presented with chest and right leg pain concerning for sickle cell crisis.    # Sickle cell pain crisis # Anemia # ? Partial medication non compliance - CT Chest neg for PE. - Likely triggered by URTI, heat and exertion. No concern for active infection. - Managed with IV hydration w/ D5 1/2NS -Pain control w/ multimodal therapy: scheduled APAP, NSAID, muscle relaxant, topical lidocaine/diclofenac.  -Opioid therapy w/ morphine  PCA and she wanted to go home without any opiates, -Hydroxyurea , folic  acid.   Per prior DC summary - Baseline Hb 7-8. Lot of antibodies to transfuse --Transfused a unit of blood without issues on 7/29   Hypomagnesemia: MgO daily    The patient's chronic medical conditions were treated accordingly per the patient's home medication regimen except as noted in the plan above and in the medication list below.    Discharge Condition:   Disposition: Patient discharged to Home or Self Care in fair condition.  Diet at discharge: No diet orders on file  Activity at Discharge: Ambulate ad lib        Physical Exam at Discharge   BP 107/64 (BP Location: Left arm, Patient Position: Sitting)   Pulse 74   Temp 98.8 F (37.1 C) (Oral)   Resp 16   Ht 1.549 m (5' 1)   Wt 50.6 kg (111 lb 8 oz)   SpO2 99%   BMI 21.07 kg/m   AOx3 Walking in the room Lungs Clear CVS:S1S2 heard Abdomen: NABS,non distended,non tender,no masses MSK: No pedal edema    Discharge Medications:    Hydroxyurea  tablet was prescribed.    Medication List     CONTINUE taking these medications    acetaminophen  500 mg tablet Commonly known as: TYLENOL  Take 500 mg by mouth every 6 (six) hours as needed (pain).   folic acid  1 mg tablet Commonly known as: FOLVITE  Take 1 tablet (1 mg total) by mouth daily.       STOP taking these medications  cholecalciferol  1,250 mcg (50,000 unit) capsule Commonly known as: VITAMIN D3   Culturelle Kids Probiotics 5 billion cell chewable tablet Generic drug: Lactobacillus rhamnosus GG   hydroxyUREA  suspension in simple syrup 100 mg/mL   penicillin  v potassium 250 mg tablet Commonly known as: VEETID   prochlorperazine 10 mg tablet Commonly known as: COMPAZINE        Significant Diagnostic Tests:   LABS:  Lab Results  Component Value Date   WBC 10.20 09/09/2023   HGB 8.9 (L) 09/09/2023   HCT 25.9 (L) 09/09/2023   PLT 531 (H) 09/09/2023   ALT 17 09/09/2023   AST 30 09/09/2023   NA 139 09/09/2023   K 3.6 09/09/2023    CL 107 09/09/2023   CREATININE 0.34 (L) 09/09/2023   BUN 9 09/09/2023   CO2 24 09/09/2023   IMAGING:  CT Angio Chest Pulmonary Embolism  Final Result by Simpson Ronold Rising, MD (07/28 1020)  CT ANGIO CHEST PULMONARY EMBOLISM, 09/02/2023 9:46 AM    INDICATION: Pulmonary embolism (PE) suspected, positive D-dimer   COMPARISON: CT chest and abdomen 01/19/2009    TECHNIQUE: Iodinated contrast was administered intravenously by rapid   injection, with further multislice axial sections acquired in the   pulmonary arterial phase from the thoracic inlet to the upper abdomen.   Coronal maximal intensity projection images were created for comprehensive   analysis and diagnosis of the regional circulation.    All CT scans at [facility] and [facility]   Imaging are performed using radiation dose optimization techniques as   appropriate to a performed exam, including but not limited to one or more   of the following: automatic exposure control, adjustment of the mA and/or   kV according to patient size, use of iterative reconstruction technique.   In addition, our institution participates in a radiation dose monitoring   program to optimize patient radiation exposure.    FINDINGS:     Pulmonary arteries: No pulmonary emboli identified.    Aorta/great vessels: No aneurysm.    Thoracic inlet/central airways: Thyroid normal. Airway patent.    Mediastinum/hila/axilla: No adenopathy.    Heart: Normal heart size. No pericardial effusion.    Lungs/pleura: Scarring/atelectasis in the right upper lobe and left lower   lobe. Scattered left-sided perihilar calcified pulmonary nodules.    Upper abdomen: Hepatomegaly. Splenectomy. Cholecystectomy. Small hiatal   hernia.    Chest wall/MSK: Several H-shaped thoracolumbar vertebral bodies, which can   be seen in setting of sickle cell disease.SABRA    IMPRESSION:  1.  No evidence of pulmonary embolism.  2.   Scattered left-sided perihilar calcified pulmonary nodules which can   be seen with sequelae of benign granulomatous disease.    XR Chest 1 View  Final Result by Eduard Vikki Guppy, MD (07/28 1301)  XR CHEST 1 VIEW, 09/02/2023 6:58 AM    INDICATION:R/O acute chest   COMPARISON: Chest x-ray 10/25/2020    FINDINGS:     Supportive devices: None  Cardiovascular/lungs/pleura: Cardiac silhouette and pulmonary vasculature   are within normal limits. No focal consolidation. No pleural effusion or   pneumothorax.   Other: No acute displaced fractures.    IMPRESSION:  No evidence of acute chest syndrome.         CULTURES:  No results found for this visit on 09/02/23 (from the past week). PATHOLOGY:  OTHER:   Surgeries/Procedures:     Other procedures performed:   Consults:   None  Follow-up Appointments:     Future Appointments  Date Time Provider Department Center  12/16/2023 12:30 PM HEM ONC 03 LAB [facility] C[facility]  12/16/2023  1:00 PM Bernardino Ill, MD [facility] C[facility]         Electronically signed by: Christine Myron, MD 09/15/2023 6:56 PM Time spent on discharge: 35 minutes.

## 2023-10-07 ENCOUNTER — Inpatient Hospital Stay (HOSPITAL_COMMUNITY)
Admission: EM | Admit: 2023-10-07 | Discharge: 2023-10-12 | DRG: 812 | Disposition: A | Discharge status: Home / Self Care | Attending: Internal Medicine | Admitting: Internal Medicine

## 2023-10-07 ENCOUNTER — Other Ambulatory Visit: Payer: Self-pay

## 2023-10-07 ENCOUNTER — Emergency Department (HOSPITAL_COMMUNITY)

## 2023-10-07 ENCOUNTER — Encounter (HOSPITAL_COMMUNITY): Payer: Self-pay | Admitting: Emergency Medicine

## 2023-10-07 DIAGNOSIS — G894 Chronic pain syndrome: Secondary | ICD-10-CM | POA: Diagnosis present

## 2023-10-07 DIAGNOSIS — D5700 Hb-SS disease with crisis, unspecified: Secondary | ICD-10-CM | POA: Diagnosis not present

## 2023-10-07 DIAGNOSIS — D649 Anemia, unspecified: Secondary | ICD-10-CM | POA: Diagnosis present

## 2023-10-07 DIAGNOSIS — F32A Depression, unspecified: Secondary | ICD-10-CM | POA: Diagnosis present

## 2023-10-07 DIAGNOSIS — D638 Anemia in other chronic diseases classified elsewhere: Secondary | ICD-10-CM | POA: Diagnosis present

## 2023-10-07 DIAGNOSIS — Z9081 Acquired absence of spleen: Secondary | ICD-10-CM

## 2023-10-07 DIAGNOSIS — Z888 Allergy status to other drugs, medicaments and biological substances status: Secondary | ICD-10-CM

## 2023-10-07 DIAGNOSIS — R519 Headache, unspecified: Secondary | ICD-10-CM | POA: Insufficient documentation

## 2023-10-07 DIAGNOSIS — Z7982 Long term (current) use of aspirin: Secondary | ICD-10-CM

## 2023-10-07 DIAGNOSIS — R0789 Other chest pain: Secondary | ICD-10-CM | POA: Diagnosis not present

## 2023-10-07 DIAGNOSIS — Z8613 Personal history of malaria: Secondary | ICD-10-CM

## 2023-10-07 DIAGNOSIS — D72829 Elevated white blood cell count, unspecified: Secondary | ICD-10-CM | POA: Diagnosis present

## 2023-10-07 DIAGNOSIS — Z862 Personal history of diseases of the blood and blood-forming organs and certain disorders involving the immune mechanism: Secondary | ICD-10-CM

## 2023-10-07 LAB — COMPREHENSIVE METABOLIC PANEL WITH GFR
ALT: 9 U/L (ref 0–44)
AST: 42 U/L — ABNORMAL HIGH (ref 15–41)
Albumin: 4.6 g/dL (ref 3.5–5.0)
Alkaline Phosphatase: 92 U/L (ref 38–126)
Anion gap: 15 (ref 5–15)
BUN: 10 mg/dL (ref 6–20)
CO2: 18 mmol/L — ABNORMAL LOW (ref 22–32)
Calcium: 9.4 mg/dL (ref 8.9–10.3)
Chloride: 110 mmol/L (ref 98–111)
Creatinine, Ser: 0.44 mg/dL (ref 0.44–1.00)
GFR, Estimated: >60 mL/min (ref >60)
Glucose, Bld: 87 mg/dL (ref 70–99)
Potassium: 4.1 mmol/L (ref 3.5–5.1)
Sodium: 143 mmol/L (ref 135–145)
Total Bilirubin: 2.5 mg/dL — ABNORMAL HIGH (ref 0.0–1.2)
Total Protein: 7.4 g/dL (ref 6.5–8.1)

## 2023-10-07 LAB — CBC WITH DIFFERENTIAL/PLATELET
Abs Immature Granulocytes: 0.15 K/uL — ABNORMAL HIGH (ref 0.00–0.07)
Basophils Absolute: 0.1 K/uL (ref 0.0–0.1)
Basophils Relative: 1 %
Eosinophils Absolute: 0.2 K/uL (ref 0.0–0.5)
Eosinophils Relative: 2 %
HCT: 20.5 % — ABNORMAL LOW (ref 36.0–46.0)
Hemoglobin: 7.2 g/dL — ABNORMAL LOW (ref 12.0–15.0)
Immature Granulocytes: 1 %
Lymphocytes Relative: 35 %
Lymphs Abs: 4.4 K/uL — ABNORMAL HIGH (ref 0.7–4.0)
MCH: 32.1 pg (ref 26.0–34.0)
MCHC: 35.1 g/dL (ref 30.0–36.0)
MCV: 91.5 fL (ref 80.0–100.0)
Monocytes Absolute: 1.1 K/uL — ABNORMAL HIGH (ref 0.1–1.0)
Monocytes Relative: 9 %
Neutro Abs: 6.6 K/uL (ref 1.7–7.7)
Neutrophils Relative %: 52 %
Platelets: 368 K/uL (ref 150–400)
RBC: 2.24 MIL/uL — ABNORMAL LOW (ref 3.87–5.11)
RDW: 24 % — ABNORMAL HIGH (ref 11.5–15.5)
Smear Review: NORMAL
WBC: 12.6 K/uL — ABNORMAL HIGH (ref 4.0–10.5)
nRBC: 2.5 % — ABNORMAL HIGH (ref 0.0–0.2)

## 2023-10-07 MED ORDER — HYDROMORPHONE HCL 1 MG/ML IJ SOLN
2.0000 mg | INTRAMUSCULAR | Status: AC
  Administered 2023-10-07: 2 mg via INTRAVENOUS
  Filled 2023-10-07: qty 2

## 2023-10-07 MED ORDER — SODIUM CHLORIDE 0.9 % IV SOLN
12.5000 mg | Freq: Once | INTRAVENOUS | Status: AC
  Administered 2023-10-07: 12.5 mg via INTRAVENOUS
  Filled 2023-10-07: qty 12.5

## 2023-10-07 MED ORDER — ONDANSETRON HCL 4 MG/2ML IJ SOLN
4.0000 mg | INTRAMUSCULAR | Status: DC | PRN
  Administered 2023-10-08: 4 mg via INTRAVENOUS
  Filled 2023-10-07: qty 2

## 2023-10-07 MED ORDER — KETOROLAC TROMETHAMINE 15 MG/ML IJ SOLN
15.0000 mg | INTRAMUSCULAR | Status: AC
  Administered 2023-10-07: 15 mg via INTRAVENOUS
  Filled 2023-10-07: qty 1

## 2023-10-07 NOTE — ED Triage Notes (Signed)
 Pt presents to the ED via POV with complaints of SCC pain that started this AM. Endorses pain in her back and last took pain meds this AM. Rates the pain 10/10. A&Ox4 at this time. Denies CP or SOB.

## 2023-10-07 NOTE — ED Provider Notes (Signed)
  [place] EMERGENCY DEPARTMENT AT [facility] Provider Note   CSN: [id number] Arrival date & time: 10/07/23  2220     Patient presents with: Sickle Cell Pain Crisis   Alicia Villegas is a 23 y.o. female.  {Add pertinent medical, surgical, social history, OB history to YEP:67052}  Sickle Cell Pain Crisis      Prior to Admission medications   Medication Sig Start Date End Date Taking? Authorizing Provider  acetaminophen  (TYLENOL ) 500 MG tablet Take 500-1,000 mg by mouth every 6 (six) hours as needed for mild pain (pain score 1-3) or moderate pain (pain score 4-6).    [provider]  Aspirin-Acetaminophen -Caffeine (EXCEDRIN PO) Take 2 tablets by mouth daily as needed.    [provider]  Cholecalciferol  (VITAMIN D3) 25 MCG (1000 UT) CAPS Take 1 capsule (1,000 Units total) by mouth daily. 12/19/22   Paseda, Folashade R, FNP  folic acid  (FOLVITE ) 1 MG tablet Take 1 tablet (1 mg total) by mouth daily. 12/19/22   Paseda, Folashade R, FNP  HYDROmorphone  (DILAUDID ) 2 MG tablet Take 1 tablet (2 mg total) by mouth every 4 (four) hours as needed for severe pain (pain score 7-10). 06/28/23   Ijaola, Onyeje M, NP  hydroxyurea  (HYDREA ) 100 mg/mL SUSP Take by mouth. 10/23/22   [provider]  ibuprofen  (ADVIL ) 200 MG tablet Take 200-800 mg by mouth every 6 (six) hours as needed for mild pain (pain score 1-3) or headache.    [provider]  Water For Irrigation, Sterile (STERILE WATER)  06/07/23   [provider]    Allergies: Patient has no known allergies.    Review of Systems  Updated Vital Signs BP 120/76   Pulse (!) 109   Temp 99.4 F (37.4 C)   Resp 17   Ht 5' 1 (1.549 m)   Wt 50.3 kg   LMP 09/24/2023 (Exact Date)   SpO2 91%   BMI 20.97 kg/m   Physical Exam  (all labs ordered are listed, but only abnormal results are displayed) Labs Reviewed  CBC WITH DIFFERENTIAL/PLATELET  COMPREHENSIVE METABOLIC PANEL WITH GFR   RETICULOCYTES    EKG: None  Radiology: No results found.  {Document cardiac monitor, telemetry assessment procedure when appropriate:32947} Procedures   Medications Ordered in the ED - No data to display    {Click here for ABCD2, HEART and other calculators REFRESH Note before signing:1}                              Medical Decision Making Amount and/or Complexity of Data Reviewed Labs: ordered.   ***  {Document critical care time when appropriate  Document review of labs and clinical decision tools ie CHADS2VASC2, etc  Document your independent review of radiology images and any outside records  Document your discussion with family members, caretakers and with consultants  Document social determinants of health affecting pt's care  Document your decision making why or why not admission, treatments were needed:32947:::1}   Final diagnoses:  None    ED Discharge Orders     None

## 2023-10-08 ENCOUNTER — Encounter (HOSPITAL_COMMUNITY): Payer: Self-pay | Admitting: Internal Medicine

## 2023-10-08 DIAGNOSIS — R0789 Other chest pain: Secondary | ICD-10-CM | POA: Diagnosis not present

## 2023-10-08 DIAGNOSIS — G894 Chronic pain syndrome: Secondary | ICD-10-CM | POA: Diagnosis not present

## 2023-10-08 DIAGNOSIS — D5700 Hb-SS disease with crisis, unspecified: Secondary | ICD-10-CM

## 2023-10-08 DIAGNOSIS — Z862 Personal history of diseases of the blood and blood-forming organs and certain disorders involving the immune mechanism: Secondary | ICD-10-CM | POA: Diagnosis not present

## 2023-10-08 DIAGNOSIS — D638 Anemia in other chronic diseases classified elsewhere: Secondary | ICD-10-CM | POA: Diagnosis not present

## 2023-10-08 DIAGNOSIS — D649 Anemia, unspecified: Secondary | ICD-10-CM

## 2023-10-08 DIAGNOSIS — Z9081 Acquired absence of spleen: Secondary | ICD-10-CM | POA: Diagnosis not present

## 2023-10-08 DIAGNOSIS — F32A Depression, unspecified: Secondary | ICD-10-CM | POA: Diagnosis not present

## 2023-10-08 DIAGNOSIS — Z7982 Long term (current) use of aspirin: Secondary | ICD-10-CM | POA: Diagnosis not present

## 2023-10-08 DIAGNOSIS — Z8613 Personal history of malaria: Secondary | ICD-10-CM | POA: Diagnosis not present

## 2023-10-08 DIAGNOSIS — Z888 Allergy status to other drugs, medicaments and biological substances status: Secondary | ICD-10-CM | POA: Diagnosis not present

## 2023-10-08 LAB — RETICULOCYTES
Immature Retic Fract: 26.3 % — ABNORMAL HIGH (ref 2.3–15.9)
RBC.: 2.28 MIL/uL — ABNORMAL LOW (ref 3.87–5.11)
Retic Count, Absolute: 661.4 K/uL — ABNORMAL HIGH (ref 19.0–186.0)
Retic Ct Pct: 29 % — ABNORMAL HIGH (ref 0.4–3.1)

## 2023-10-08 LAB — CBC
HCT: 19.4 % — ABNORMAL LOW (ref 36.0–46.0)
Hemoglobin: 6.7 g/dL — CL (ref 12.0–15.0)
MCH: 32.2 pg (ref 26.0–34.0)
MCHC: 34.5 g/dL (ref 30.0–36.0)
MCV: 93.3 fL (ref 80.0–100.0)
Platelets: 331 K/uL (ref 150–400)
RBC: 2.08 MIL/uL — ABNORMAL LOW (ref 3.87–5.11)
RDW: 23.8 % — ABNORMAL HIGH (ref 11.5–15.5)
WBC: 15.8 K/uL — ABNORMAL HIGH (ref 4.0–10.5)
nRBC: 1.6 % — ABNORMAL HIGH (ref 0.0–0.2)

## 2023-10-08 LAB — COMPREHENSIVE METABOLIC PANEL WITH GFR
ALT: 9 U/L (ref 0–44)
AST: 42 U/L — ABNORMAL HIGH (ref 15–41)
Albumin: 4.2 g/dL (ref 3.5–5.0)
Alkaline Phosphatase: 87 U/L (ref 38–126)
Anion gap: 13 (ref 5–15)
BUN: 8 mg/dL (ref 6–20)
CO2: 18 mmol/L — ABNORMAL LOW (ref 22–32)
Calcium: 9 mg/dL (ref 8.9–10.3)
Chloride: 107 mmol/L (ref 98–111)
Creatinine, Ser: 0.36 mg/dL — ABNORMAL LOW (ref 0.44–1.00)
GFR, Estimated: >60 mL/min (ref >60)
Glucose, Bld: 91 mg/dL (ref 70–99)
Potassium: 4 mmol/L (ref 3.5–5.1)
Sodium: 138 mmol/L (ref 135–145)
Total Bilirubin: 2 mg/dL — ABNORMAL HIGH (ref 0.0–1.2)
Total Protein: 6.9 g/dL (ref 6.5–8.1)

## 2023-10-08 LAB — HCG, SERUM, QUALITATIVE: Preg, Serum: NEGATIVE

## 2023-10-08 MED ORDER — LACTATED RINGERS IV BOLUS
1000.0000 mL | Freq: Once | INTRAVENOUS | Status: AC
  Administered 2023-10-08: 1000 mL via INTRAVENOUS

## 2023-10-08 MED ORDER — ONDANSETRON HCL 4 MG/2ML IJ SOLN
4.0000 mg | Freq: Four times a day (QID) | INTRAMUSCULAR | Status: DC | PRN
  Administered 2023-10-09 – 2023-10-11 (×3): 4 mg via INTRAVENOUS
  Filled 2023-10-08 (×3): qty 2

## 2023-10-08 MED ORDER — METHOCARBAMOL 1000 MG/10ML IJ SOLN: 500.0000 mg | Freq: Four times a day (QID) | INTRAMUSCULAR | Status: DC | PRN

## 2023-10-08 MED ORDER — FOLIC ACID 1 MG PO TABS
1.0000 mg | ORAL_TABLET | Freq: Every day | ORAL | Status: DC
  Administered 2023-10-08 – 2023-10-12 (×5): 1 mg via ORAL
  Filled 2023-10-08 (×5): qty 1

## 2023-10-08 MED ORDER — SODIUM CHLORIDE 0.9% FLUSH
9.0000 mL | INTRAVENOUS | Status: DC | PRN
Start: 2023-10-08 — End: 2023-10-12

## 2023-10-08 MED ORDER — KETOROLAC TROMETHAMINE 15 MG/ML IJ SOLN
15.0000 mg | Freq: Three times a day (TID) | INTRAMUSCULAR | Status: AC
Start: 2023-10-08 — End: 2023-10-10
  Administered 2023-10-08 – 2023-10-10 (×6): 15 mg via INTRAVENOUS
  Filled 2023-10-08 (×6): qty 1

## 2023-10-08 MED ORDER — HYDROMORPHONE HCL 1 MG/ML IJ SOLN: 2.0000 mg | INTRAMUSCULAR | Status: DC | PRN

## 2023-10-08 MED ORDER — NALOXONE HCL 0.4 MG/ML IJ SOLN: 0.4000 mg | INTRAMUSCULAR | Status: DC | PRN

## 2023-10-08 MED ORDER — ACETAMINOPHEN 325 MG PO TABS
650.0000 mg | ORAL_TABLET | Freq: Four times a day (QID) | ORAL | Status: DC | PRN
  Administered 2023-10-09: 650 mg via ORAL
  Filled 2023-10-08: qty 2

## 2023-10-08 MED ORDER — ACETAMINOPHEN 10 MG/ML IV SOLN
1000.0000 mg | Freq: Once | INTRAVENOUS | Status: AC
  Administered 2023-10-08: 1000 mg via INTRAVENOUS
  Filled 2023-10-08: qty 100

## 2023-10-08 MED ORDER — SODIUM CHLORIDE 0.9 % IV SOLN: 250.0000 mL | INTRAVENOUS | Status: AC | PRN

## 2023-10-08 MED ORDER — ACETAMINOPHEN 650 MG RE SUPP: 650.0000 mg | Freq: Four times a day (QID) | RECTAL | Status: DC | PRN

## 2023-10-08 MED ORDER — SODIUM CHLORIDE 0.9% FLUSH
3.0000 mL | Freq: Two times a day (BID) | INTRAVENOUS | Status: DC
  Administered 2023-10-08 – 2023-10-09 (×2): 3 mL via INTRAVENOUS

## 2023-10-08 MED ORDER — LACTATED RINGERS IV SOLN: INTRAVENOUS | Status: DC

## 2023-10-08 MED ORDER — HEPARIN SODIUM (PORCINE) 5000 UNIT/ML IJ SOLN
5000.0000 [IU] | Freq: Three times a day (TID) | INTRAMUSCULAR | Status: DC
  Filled 2023-10-08 (×3): qty 1

## 2023-10-08 MED ORDER — HYDROMORPHONE 1 MG/ML IV SOLN
INTRAVENOUS | Status: AC
  Administered 2023-10-08: 0.6 mg via INTRAVENOUS
  Administered 2023-10-08: 30 mg via INTRAVENOUS
  Administered 2023-10-09: 1.5 mg via INTRAVENOUS
  Filled 2023-10-08: qty 30

## 2023-10-08 MED ORDER — HYDROMORPHONE HCL 1 MG/ML IJ SOLN
2.0000 mg | Freq: Once | INTRAMUSCULAR | Status: AC
  Administered 2023-10-08: 2 mg via INTRAVENOUS
  Filled 2023-10-08: qty 2

## 2023-10-08 MED ORDER — SODIUM CHLORIDE 0.9% FLUSH
3.0000 mL | Freq: Two times a day (BID) | INTRAVENOUS | Status: DC
  Administered 2023-10-08 – 2023-10-12 (×6): 3 mL via INTRAVENOUS

## 2023-10-08 MED ORDER — HYDROXYUREA 100 MG/ML ORAL SUSPENSION: 100.0000 mg | Freq: Every day | ORAL | Status: DC

## 2023-10-08 MED ORDER — DIPHENHYDRAMINE HCL 50 MG/ML IJ SOLN: 50.0000 mg | Freq: Four times a day (QID) | INTRAMUSCULAR | Status: DC | PRN

## 2023-10-08 MED ORDER — SODIUM CHLORIDE 0.9 % IV SOLN
12.5000 mg | Freq: Once | INTRAVENOUS | Status: AC
  Administered 2023-10-08: 12.5 mg via INTRAVENOUS
  Filled 2023-10-08: qty 12.5

## 2023-10-08 MED ORDER — SODIUM CHLORIDE 0.9% FLUSH: 3.0000 mL | INTRAVENOUS | Status: DC | PRN

## 2023-10-08 NOTE — H&P (Addendum)
 History and Physical    [name] FMW:[id number] DOB: 2000-09-16 DOA: 10/07/2023  PCP: Tilford Bertram HERO, FNP   Patient coming from: Home   Chief Complaint:  Chief Complaint  Patient presents with   Sickle Cell Pain Crisis   ED TRIAGE note:Pt presents to the ED via POV with complaints of SCC pain that started this AM. Endorses pain in her back and last took pain meds this AM. Rates the pain 10/10. A&Ox4 at this time. Denies CP or SOB.   HPI:  Alicia Villegas is a 23 y.o. female with medical history significant of sickle cell disease, anemia of chronic disease, and chronic pain syndrome presented to emergency department complaining of pain all over and more sores pain in the lower back.  Patient reported taking ibuprofen  at home without much relief/improvement. Patient reported that intermitted lower back pain 6 out of 10 intensity currently after getting multiple doses of Dilaudid  and Toradol  in the ED. Patient seems lethargic and tired.  Also complaining about bilateral chest wall pain and bilateral upper and lower extremity pain as well.  Denies any fever, chill, abdominal pain, nausea, vomiting, and cough. Reported at home she only takes ibuprofen  for pain control currently not taking oral Dilaudid  anymore.  ED Course:  In the ED patient is hemodynamically stable.  O2 sat 100% on 2 L nasal cannula oxygen .  Afebrile. CMP unremarkable except low bicarb 18 and chronically elevated bilirubin 2.5 and elevated AST 42. CBC showing leukocytosis 12.6, stable H&H 7.2 and 20 and platelet count 386.  Elevated reticulocyte count. Pregnancy test negative. X-ray no acute disease process.  In the ED patient received multiple doses of Dilaudid , Toradol  Zofran  and Benadryl .  Hospitalist has been consulted for management of sickle cell pain crisis.   Significant labs in the ED: Lab Orders         CBC with Differential         Comprehensive metabolic panel         Reticulocytes         hCG,  serum, qualitative         Comprehensive metabolic panel         CBC       Review of Systems:  Review of Systems  Constitutional:  Positive for malaise/fatigue. Negative for chills, fever and weight loss.  Respiratory:  Negative for cough and shortness of breath.   Cardiovascular:  Negative for chest pain.  Gastrointestinal:  Negative for abdominal pain, heartburn, nausea and vomiting.  Musculoskeletal:  Positive for back pain and myalgias. Negative for joint pain.       Generalized pain  Neurological:  Negative for dizziness and headaches.  Psychiatric/Behavioral:  The patient is not nervous/anxious.     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 She has never used smokeless tobacco. She reports that she does  not drink alcohol and does not use drugs.  Allergies  Allergen Reactions   Ketamine Nausea Only    Family History  Family history unknown: Yes    Prior to Admission medications   Medication Sig Start Date End Date Taking? Authorizing Provider  acetaminophen  (TYLENOL ) 500 MG tablet Take 500-1,000 mg by mouth every 6 (six) hours as needed for mild pain (pain score 1-3) or moderate pain (pain score 4-6).    [provider]  Aspirin-Acetaminophen -Caffeine (EXCEDRIN PO) Take 2 tablets by mouth daily as needed.    [provider]  Cholecalciferol  (VITAMIN D3) 25 MCG (1000 UT) CAPS Take 1 capsule (1,000 Units total) by mouth daily.  12/19/22   Paseda, Folashade R, FNP  folic acid  (FOLVITE ) 1 MG tablet Take 1 tablet (1 mg total) by mouth daily. 12/19/22   Paseda, Folashade R, FNP  HYDROmorphone  (DILAUDID ) 2 MG tablet Take 1 tablet (2 mg total) by mouth every 4 (four) hours as needed for severe pain (pain score 7-10). 06/28/23   Ijaola, Onyeje M, NP  hydroxyurea  (HYDREA ) 100 mg/mL SUSP Take by mouth. 10/23/22   [provider]  ibuprofen  (ADVIL ) 200 MG tablet Take 200-800 mg by mouth every 6 (six) hours as needed for mild pain (pain score 1-3) or headache.    [provider]  Water For Irrigation, Sterile (STERILE WATER)  06/07/23   [provider]     Physical Exam: Vitals:   10/08/23 0000 10/08/23 0211 10/08/23 0300 10/08/23 0302  BP: 110/66   110/70  Pulse: (!) 104  86 86  Resp: (!) 21  16 16   Temp:  98.3 F (36.8 C)  98.5 F (36.9 C)  SpO2: 90%  99% 99%  Weight:      Height:        Physical Exam Vitals and nursing note reviewed.  Constitutional:      General: She is not in acute distress.    Appearance: She is ill-appearing.  HENT:     Mouth/Throat:     Mouth: Mucous membranes are moist.  Eyes:     Pupils: Pupils are equal, round, and reactive to light.  Cardiovascular:     Rate and Rhythm: Normal rate and regular rhythm.     Pulses: Normal pulses.     Heart sounds: Normal heart sounds.  Pulmonary:     Effort: Pulmonary effort is normal.     Breath sounds: Normal breath sounds.  Abdominal:     General: Abdomen is flat.     Palpations: Abdomen is soft.  Musculoskeletal:     Cervical back: Neck supple.     Right lower leg: No edema.     Left lower leg: No edema.  Skin:    Capillary Refill: Capillary refill takes less than 2 seconds.  Neurological:     Mental Status: She is oriented to person, place, and time.  Psychiatric:        Mood and Affect: Mood normal.      Labs on Admission: I have personally reviewed following labs and imaging studies  CBC: Recent Labs   Lab 10/07/23 2250  WBC 12.6*  NEUTROABS 6.6  HGB 7.2*  HCT 20.5*  MCV 91.5  PLT 368   Basic Metabolic Panel: Recent Labs  Lab 10/07/23 2250  NA 143  K 4.1  CL 110  CO2 18*  GLUCOSE 87  BUN 10  CREATININE 0.44  CALCIUM 9.4   GFR: Estimated Creatinine Clearance: 83.9 mL/min (by C-G formula  based on SCr of 0.44 mg/dL). Liver Function Tests: Recent Labs  Lab 10/07/23 2250  AST 42*  ALT 9  ALKPHOS 92  BILITOT 2.5*  PROT 7.4  ALBUMIN 4.6   No results for input(s): LIPASE, AMYLASE in the last 168 hours. No results for input(s): AMMONIA in the last 168 hours. Coagulation Profile: No results for input(s): INR, PROTIME in the last 168 hours. Cardiac Enzymes: No results for input(s): CKTOTAL, CKMB, CKMBINDEX, TROPONINI, TROPONINIHS in the last 168 hours. BNP (last 3 results) No results for input(s): BNP in the last 8760 hours. HbA1C: No results for input(s): HGBA1C in the last 72 hours. CBG: No results for input(s): GLUCAP in the last 168 hours. Lipid Profile: No results for input(s): CHOL, HDL, LDLCALC, TRIG, CHOLHDL, LDLDIRECT in the last 72 hours. Thyroid Function Tests: No results for input(s): TSH, T4TOTAL, FREET4, T3FREE, THYROIDAB in the last 72 hours. Anemia Panel: Recent Labs    10/07/23 2250  RETICCTPCT 29.0*   Urine analysis:    Component Value Date/Time   COLORURINE YELLOW 06/26/2023 1120   APPEARANCEUR CLEAR 06/26/2023 1120   LABSPEC 1.043 (H) 06/26/2023 1120   PHURINE 6.0 06/26/2023 1120   GLUCOSEU NEGATIVE 06/26/2023 1120   HGBUR NEGATIVE 06/26/2023 1120   BILIRUBINUR NEGATIVE 06/26/2023 1120   BILIRUBINUR negative 04/24/2022 1537   KETONESUR NEGATIVE 06/26/2023 1120   PROTEINUR NEGATIVE 06/26/2023 1120   UROBILINOGEN 1.0 04/24/2022 1537   UROBILINOGEN 1.0 01/13/2014 1612   NITRITE NEGATIVE 06/26/2023 1120   LEUKOCYTESUR NEGATIVE 06/26/2023 1120    Radiological Exams on Admission: I have  personally reviewed images DG Chest 2 View Result Date: 10/08/2023 CLINICAL DATA:  Sickle cell pain EXAM: CHEST - 2 VIEW COMPARISON:  02/18/2023 FINDINGS: Cardiac shadow is stable. Lungs are well aerated bilaterally. No focal infiltrate or effusion is seen. No bony abnormality is noted. Mild scoliosis in the midthoracic spine is seen. IMPRESSION: No acute abnormality noted. Electronically Signed   By: Oneil Devonshire M.D.   On: 10/08/2023 00:04     EKG: My personal interpretation of EKG shows: EKG showed normal sinus rhythm 94.  There is no ST to abnormality.  Normal QTc intervals.  Assessment/Plan: Active Problems:   Sickle cell pain crisis ([name])   Chronic anemia   History of sickle cell disease    Assessment and Plan: Sickle cell pain crisis History of sickle cell disease Chronic pain syndrome -Presented to emergency department generalized pain however more severe back pain, bilateral chest wall pain and upper and lower extremities pain.  Denies any fever, chill, nausea vomiting and cough. - Presentation to ED patient is hemodynamically stable. CMP unremarkable except low bicarb 18 and chronically elevated bilirubin 2.5 and elevated AST 42. -CBC showing leukocytosis 12.6, stable H&H 7.2 and 20 and platelet count 386.  Elevated reticulocyte count. - X-ray no acute disease process. -In the ED patient received multiple doses of Dilaudid , Toradol  Zofran  and Benadryl . -Recently admitted in [facility] and managed with Dilaudid  PCA. - Continue multimodal treatment management with Dilaudid  PCA, IV Toradol  15 mg 3 times daily, Tylenol  1000 mg one-time dose, and Robaxin  as needed. -Continue hydroxyurea  and folic acid . -Giving 1 LR bolus afterward continue maintenance fluid LR 125 cc/h. - Continue to check ETCO2 while on PCA.  Narcan  on board as needed in case patient need opioid reversal.  Continue to check respiratory rate. - Goal is to gradually wean down Dilaudid  PCA. - Continue supportive  care.   DVT prophylaxis:  SQ Heparin , SCD  and TED hose Code Status:  Full Code Diet: Regular diet Disposition Plan: Continue to monitor improvement of pain. Consults: Sickle cell team Admission status:   Observation, progressive unit  Severity of Illness: The appropriate patient status for this patient is OBSERVATION. Observation status is judged to be reasonable and necessary in order to provide the required intensity of service to ensure the patient's safety. The patient's presenting symptoms, physical exam findings, and initial radiographic and laboratory data in the context of their medical condition is felt to place them at decreased risk for further clinical deterioration. Furthermore, it is anticipated that the patient will be medically stable for discharge from the hospital within 2 midnights of admission.     Naasir Carreira, MD [facility]  How to contact the [facility] Attending or Consulting provider 7A - 7P or covering provider during after hours 7P -7A, for this patient.  Check the care team in [facility] and look for a) attending/consulting [facility] provider listed and b) the [facility] team listed Log into [email or web address] and use [place]'s universal password to access. If you do not have the password, please contact the hospital operator. Locate the [facility] provider you are looking for under [facility] and page to a number that you can be directly reached. If you still have difficulty reaching the provider, please page the [facility] (Director on Call) for the Hospitalists listed on amion for assistance.  10/08/2023, 5:40 AM

## 2023-10-08 NOTE — Progress Notes (Addendum)
 Alicia Villegas is a 23 y.o. female with medical history significant of sickle cell disease, anemia of chronic disease, and chronic pain syndrome presented to emergency department complaining of pain all over and more sores pain in the lower back.  Patient reported taking ibuprofen  at home without much relief/improvement. Patient intensity is 6/10  after getting multiple doses of Dilaudid  and Toradol  in the ED . She was admitted for unresolved sickle cell pain management, fatigue and overall weakness.  Patient is reporting slight improvement to pain this morning, Hgb is 6.7g/dl, placed orders for type and screen, will transfuse when hgb is below patients baseline.  Patient denies, fever, Nausea, Vomiting, diarrhea, cough, No urinary symptoms, no recent sick contacts.

## 2023-10-08 NOTE — Plan of Care (Signed)
  Problem: Education: Goal: Knowledge of vaso-occlusive preventative measures will improve Outcome: Progressing Goal: Awareness of infection prevention will improve Outcome: Progressing Goal: Awareness of signs and symptoms of anemia will improve Outcome: Progressing

## 2023-10-08 NOTE — Progress Notes (Signed)
   10/08/23 1253  Vitals  Temp 98.7 F (37.1 C)  Temp Source Oral  BP (!) 84/51  MAP (mmHg) (!) 63  BP Method Automatic  Pulse Rate 64  Pulse Rate Source Monitor  Resp 16  MEWS COLOR  MEWS Score Color Green  Oxygen  Therapy  SpO2 100 %  O2 Device Nasal Cannula  O2 Flow Rate (L/min) 2 L/min  MEWS Score  MEWS Temp 0  MEWS Systolic 1  MEWS Pulse 0  MEWS RR 0  MEWS LOC 0  MEWS Score 1   MD notified. Will continue to monitor.

## 2023-10-09 LAB — PREPARE RBC (CROSSMATCH)

## 2023-10-09 LAB — CBC
HCT: 17.9 % — ABNORMAL LOW (ref 36.0–46.0)
Hemoglobin: 6.6 g/dL — CL (ref 12.0–15.0)
MCH: 33.2 pg (ref 26.0–34.0)
MCHC: 36.9 g/dL — ABNORMAL HIGH (ref 30.0–36.0)
MCV: 89.9 fL (ref 80.0–100.0)
Platelets: 319 K/uL (ref 150–400)
RBC: 1.99 MIL/uL — ABNORMAL LOW (ref 3.87–5.11)
RDW: 22.6 % — ABNORMAL HIGH (ref 11.5–15.5)
WBC: 8.9 K/uL (ref 4.0–10.5)
nRBC: 0.2 % (ref 0.0–0.2)

## 2023-10-09 LAB — HEMOGLOBIN AND HEMATOCRIT, BLOOD
HCT: 28.4 % — ABNORMAL LOW (ref 36.0–46.0)
Hemoglobin: 9.5 g/dL — ABNORMAL LOW (ref 12.0–15.0)

## 2023-10-09 MED ORDER — ACETAMINOPHEN 325 MG PO TABS
650.0000 mg | ORAL_TABLET | Freq: Once | ORAL | Status: AC
  Administered 2023-10-09: 650 mg via ORAL
  Filled 2023-10-09: qty 2

## 2023-10-09 MED ORDER — DIPHENHYDRAMINE HCL 25 MG PO CAPS
25.0000 mg | ORAL_CAPSULE | Freq: Four times a day (QID) | ORAL | Status: DC | PRN
  Administered 2023-10-09: 25 mg via ORAL
  Filled 2023-10-09: qty 1

## 2023-10-09 MED ORDER — SODIUM CHLORIDE 0.9% IV SOLUTION: Freq: Once | INTRAVENOUS | Status: AC

## 2023-10-09 MED ORDER — DIPHENHYDRAMINE HCL 25 MG PO CAPS
25.0000 mg | ORAL_CAPSULE | Freq: Once | ORAL | Status: AC
  Administered 2023-10-09: 25 mg via ORAL
  Filled 2023-10-09: qty 1

## 2023-10-09 NOTE — Plan of Care (Signed)
  Problem: Education: Goal: Awareness of signs and symptoms of anemia will improve Outcome: Progressing   Problem: Sensory: Goal: Pain level will decrease with appropriate interventions Outcome: Progressing   Problem: Health Behavior/Discharge Planning: Goal: Ability to manage health-related needs will improve Outcome: Progressing

## 2023-10-09 NOTE — Progress Notes (Signed)
   10/09/23 1425  TOC Brief Assessment  Insurance and Status Reviewed  Patient has primary care physician Yes  Home environment has been reviewed home with self  Prior level of function: independent  Prior/Current Home Services No current home services  Social Drivers of Health Review SDOH reviewed no interventions necessary  Readmission risk has been reviewed Yes  Transition of care needs no transition of care needs at this time

## 2023-10-09 NOTE — Progress Notes (Signed)
 Patient ID: Alicia Villegas, female   DOB: Jul 21, 2000, 23 y.o.   MRN: [id number] Subjective: Alicia Villegas is a 23 y.o. female with medical history significant of sickle cell disease, anemia of chronic disease, and chronic pain syndrome presented to emergency department complaining of pain all over and more sores pain in the lower back.   Patient continues to endorse pain of 6/10, slight headache, and mild weakness. Hgb is lower than base line, she will be getting blood transfusion today and tylenol  as needed. Denies cough, N/V/D. No urinary symptoms.   Objective:  Vital signs in last 24 hours:  Vitals:   10/09/23 0608 10/09/23 0738 10/09/23 0800 10/09/23 1149  BP: 101/63  99/63 (!) 97/55  Pulse: 75  71 65  Resp: 18 19    Temp: 98 F (36.7 C)  98.2 F (36.8 C) 98 F (36.7 C)  TempSrc: Oral  Oral Oral  SpO2: 98% 99% 99% 98%  Weight:      Height:        Intake/Output from previous day:   Intake/Output Summary (Last 24 hours) at 10/09/2023 1208 Last data filed at 10/09/2023 0700 Gross per 24 hour  Intake 2964.11 ml  Output --  Net 2964.11 ml    Physical Exam: General: Alert, awake, oriented x3, in no acute distress.  HEENT: [place]/AT PEERL, EOMI Neck: Trachea midline,  no masses, no thyromegal,y no JVD, no carotid bruit OROPHARYNX:  Moist, No exudate/ erythema/lesions.  Heart: Regular rate and rhythm, without murmurs, rubs, gallops, PMI non-displaced, no heaves or thrills on palpation.  Lungs: Clear to auscultation, no wheezing or rhonchi noted. No increased vocal fremitus resonant to percussion  Abdomen: Soft, nontender, nondistended, positive bowel sounds, no masses no hepatosplenomegaly noted..  Neuro: No focal neurological deficits noted cranial nerves II through XII grossly intact. DTRs 2+ bilaterally upper and lower extremities. Strength 5 out of 5 in bilateral upper and lower extremities. Musculoskeletal: Generalize body tenderness Psychiatric: Patient alert and oriented x3, good  insight and cognition, good recent to remote recall. Lymph node survey: No cervical axillary or inguinal lymphadenopathy noted.  Lab Results:  Basic Metabolic Panel:    Component Value Date/Time   NA 138 10/08/2023 0635   NA 140 10/23/2022 1047   K 4.0 10/08/2023 0635   CL 107 10/08/2023 0635   CO2 18 (L) 10/08/2023 0635   BUN 8 10/08/2023 0635   BUN 10 10/23/2022 1047   CREATININE 0.36 (L) 10/08/2023 0635   CREATININE 0.30 (L) 05/02/2015 1442   GLUCOSE 91 10/08/2023 0635   CALCIUM 9.0 10/08/2023 0635   CBC:    Component Value Date/Time   WBC 8.9 10/09/2023 0426   HGB 6.6 (LL) 10/09/2023 0426   HGB 7.5 (L) 12/28/2022 1335   HCT 17.9 (L) 10/09/2023 0426   HCT 21.8 (L) 12/28/2022 1335   PLT 319 10/09/2023 0426   PLT 408 12/28/2022 1335   MCV 89.9 10/09/2023 0426   MCV 101 (H) 12/28/2022 1335   NEUTROABS 6.6 10/07/2023 2250   NEUTROABS 6.5 10/23/2022 1047   LYMPHSABS 4.4 (H) 10/07/2023 2250   LYMPHSABS 4.2 (H) 10/23/2022 1047   MONOABS 1.1 (H) 10/07/2023 2250   EOSABS 0.2 10/07/2023 2250   EOSABS 0.1 10/23/2022 1047   BASOSABS 0.1 10/07/2023 2250   BASOSABS 0.1 10/23/2022 1047    No results found for this or any previous visit (from the past 240 hours).  Studies/Results: DG Chest 2 View Result Date: 10/08/2023 CLINICAL DATA:  Sickle cell pain EXAM: CHEST -  2 VIEW COMPARISON:  02/18/2023 FINDINGS: Cardiac shadow is stable. Lungs are well aerated bilaterally. No focal infiltrate or effusion is seen. No bony abnormality is noted. Mild scoliosis in the midthoracic spine is seen. IMPRESSION: No acute abnormality noted. Electronically Signed   By: Oneil Devonshire M.D.   On: 10/08/2023 00:04    Medications: Scheduled Meds:  sodium chloride    Intravenous Once   diphenhydrAMINE   25 mg Oral Once   folic acid   1 mg Oral Daily   heparin   5,000 Units Subcutaneous Q8H   HYDROmorphone    Intravenous Q4H   hydroxyurea   100 mg Oral Daily   ketorolac   15 mg Intravenous Q8H   sodium  chloride flush  3 mL Intravenous Q12H   sodium chloride  flush  3 mL Intravenous Q12H   Continuous Infusions: PRN Meds:.acetaminophen  **OR** acetaminophen , diphenhydrAMINE , methocarbamol  (ROBAXIN ) injection, naloxone  **AND** sodium chloride  flush, ondansetron  (ZOFRAN ) IV, sodium chloride  flush  Consultants: None  Procedures: Blood transfusion  Antibiotics: None  Assessment/Plan: Active Problems:   Depression   Sickle cell pain crisis ([name])   Leukocytosis   Anemia of chronic disease   Chronic anemia   History of sickle cell disease   Hb Sickle Cell Disease with Pain crisis: Continue IVF 0.45% Saline @ 125 mls/hour, continue weight based Dilaudid  PCA, IV Toradol  15 mg Q 6 H for a total of 5 days, continue oral home pain medications as ordered. Monitor vitals very closely, Re-evaluate pain scale regularly, 2 L of Oxygen  by [place]. Patient encouraged to ambulate on the hallway today.  Leukocytosis: Stable. Anemia of Chronic Disease: Hgb lower than baseline today 6.6 g/dl will transfuse 1 unit PRBCs. Will continue to monitor daily Cbc Chronic pain Syndrome:Continue oral home pain medication Depression: patient is stable, denies suicidal ideations, continue medications as prescribed.     Code Status: Full Code Family Communication: N/A Disposition Plan: Not yet ready for discharge  Homer CHRISTELLA Cover NP   If 7PM-7AM, please contact night-coverage.  10/09/2023, 12:08 PM  LOS: 1 day

## 2023-10-10 DIAGNOSIS — R519 Headache, unspecified: Secondary | ICD-10-CM | POA: Insufficient documentation

## 2023-10-10 LAB — TYPE AND SCREEN
ABO/RH(D): AB POS
Antibody Screen: POSITIVE
Unit division: 0

## 2023-10-10 LAB — BPAM RBC
Blood Product Expiration Date: 202510132359
ISSUE DATE / TIME: 202509031505
Unit Type and Rh: 7300

## 2023-10-10 LAB — CBC
HCT: 23.9 % — ABNORMAL LOW (ref 36.0–46.0)
Hemoglobin: 8.7 g/dL — ABNORMAL LOW (ref 12.0–15.0)
MCH: 31.1 pg (ref 26.0–34.0)
MCHC: 36.4 g/dL — ABNORMAL HIGH (ref 30.0–36.0)
MCV: 85.4 fL (ref 80.0–100.0)
Platelets: 325 K/uL (ref 150–400)
RBC: 2.8 MIL/uL — ABNORMAL LOW (ref 3.87–5.11)
RDW: 21 % — ABNORMAL HIGH (ref 11.5–15.5)
WBC: 9 K/uL (ref 4.0–10.5)
nRBC: 0.4 % — ABNORMAL HIGH (ref 0.0–0.2)

## 2023-10-10 MED ORDER — SUMATRIPTAN SUCCINATE 50 MG PO TABS
50.0000 mg | ORAL_TABLET | ORAL | Status: AC | PRN
  Administered 2023-10-10 – 2023-10-11 (×2): 50 mg via ORAL
  Filled 2023-10-10 (×3): qty 1

## 2023-10-10 MED ORDER — HYDROXYUREA 500 MG PO CAPS
1000.0000 mg | ORAL_CAPSULE | Freq: Every day | ORAL | Status: DC
  Administered 2023-10-11 – 2023-10-12 (×2): 1000 mg via ORAL
  Filled 2023-10-10 (×3): qty 2

## 2023-10-10 MED ORDER — ORAL CARE MOUTH RINSE: 15.0000 mL | OROMUCOSAL | Status: DC | PRN

## 2023-10-10 MED ORDER — SODIUM CHLORIDE 0.45 % IV SOLN: INTRAVENOUS | Status: DC

## 2023-10-10 MED ORDER — SUMATRIPTAN SUCCINATE 25 MG PO TABS
25.0000 mg | ORAL_TABLET | ORAL | Status: AC | PRN
  Administered 2023-10-10 (×2): 25 mg via ORAL
  Filled 2023-10-10 (×3): qty 1

## 2023-10-10 MED ORDER — HYDROMORPHONE 1 MG/ML IV SOLN
INTRAVENOUS | Status: AC
  Administered 2023-10-10: 2.4 mg via INTRAVENOUS
  Administered 2023-10-10: 0.6 mg via INTRAVENOUS
  Administered 2023-10-10: 1.2 mg via INTRAVENOUS
  Administered 2023-10-11: 0.9 mg via INTRAVENOUS
  Administered 2023-10-11: 1.8 mg via INTRAVENOUS
  Administered 2023-10-11: 0.3 mg via INTRAVENOUS

## 2023-10-10 NOTE — Progress Notes (Signed)
 Patient ID: Alicia Villegas, female   DOB: [birth year], 23 y.o.   MRN: [id number] Subjective: Alicia Villegas is a 23 y.o. female with medical history significant of sickle cell disease, anemia of chronic disease, and chronic pain syndrome presented to emergency department complaining of pain all over and more sores pain in the lower back.   Patient continues to endorse pain of 6/10, ongoing headache, and mild weakness. Denies cough, N/V/D. No urinary symptoms.   Objective:  Vital signs in last 24 hours:  Vitals:   10/10/23 0658 10/10/23 0822 10/10/23 1216 10/10/23 1325  BP: 103/69   (!) 108/57  Pulse: 79   75  Resp:  20 16 18   Temp:    98.4 F (36.9 C)  TempSrc:    Oral  SpO2:  97% 97% 98%  Weight:      Height:        Intake/Output from previous day:   Intake/Output Summary (Last 24 hours) at 10/10/2023 1325 Last data filed at 10/10/2023 1244 Gross per 24 hour  Intake 846.1 ml  Output --  Net 846.1 ml    Physical Exam: General: Alert, awake, oriented x3, in no acute distress.  HEENT: [place]/AT PEERL, EOMI Neck: Trachea midline,  no masses, no thyromegal,y no JVD, no carotid bruit OROPHARYNX:  Moist, No exudate/ erythema/lesions.  Heart: Regular rate and rhythm, without murmurs, rubs, gallops, PMI non-displaced, no heaves or thrills on palpation.  Lungs: Clear to auscultation, no wheezing or rhonchi noted. No increased vocal fremitus resonant to percussion  Abdomen: Soft, nontender, nondistended, positive bowel sounds, no masses no hepatosplenomegaly noted..  Neuro: No focal neurological deficits noted cranial nerves II through XII grossly intact. DTRs 2+ bilaterally upper and lower extremities. Strength 5 out of 5 in bilateral upper and lower extremities. Musculoskeletal: Generalize body tenderness Psychiatric: Patient alert and oriented x3, good insight and cognition, good recent to remote recall. Lymph node survey: No cervical axillary or inguinal lymphadenopathy noted.  Lab  Results:  Basic Metabolic Panel:    Component Value Date/Time   NA 138 10/08/2023 0635   NA 140 10/23/2022 1047   K 4.0 10/08/2023 0635   CL 107 10/08/2023 0635   CO2 18 (L) 10/08/2023 0635   BUN 8 10/08/2023 0635   BUN 10 10/23/2022 1047   CREATININE 0.36 (L) 10/08/2023 0635   CREATININE 0.30 (L) 05/02/2015 1442   GLUCOSE 91 10/08/2023 0635   CALCIUM 9.0 10/08/2023 0635   CBC:    Component Value Date/Time   WBC 9.0 10/10/2023 0634   HGB 8.7 (L) 10/10/2023 0634   HGB 7.5 (L) 12/28/2022 1335   HCT 23.9 (L) 10/10/2023 0634   HCT 21.8 (L) 12/28/2022 1335   PLT 325 10/10/2023 0634   PLT 408 12/28/2022 1335   MCV 85.4 10/10/2023 0634   MCV 101 (H) 12/28/2022 1335   NEUTROABS 6.6 10/07/2023 2250   NEUTROABS 6.5 10/23/2022 1047   LYMPHSABS 4.4 (H) 10/07/2023 2250   LYMPHSABS 4.2 (H) 10/23/2022 1047   MONOABS 1.1 (H) 10/07/2023 2250   EOSABS 0.2 10/07/2023 2250   EOSABS 0.1 10/23/2022 1047   BASOSABS 0.1 10/07/2023 2250   BASOSABS 0.1 10/23/2022 1047    No results found for this or any previous visit (from the past 240 hours).  Studies/Results: No results found.   Medications: Scheduled Meds:  folic acid   1 mg Oral Daily   heparin   5,000 Units Subcutaneous Q8H   HYDROmorphone    Intravenous Q4H   hydroxyurea   1,000 mg Oral  Daily   sodium chloride  flush  3 mL Intravenous Q12H   sodium chloride  flush  3 mL Intravenous Q12H   Continuous Infusions: PRN Meds:.acetaminophen  **OR** acetaminophen , diphenhydrAMINE , methocarbamol  (ROBAXIN ) injection, naloxone  **AND** sodium chloride  flush, ondansetron  (ZOFRAN ) IV, mouth rinse, sodium chloride  flush, SUMAtriptan   Consultants: None  Procedures: Blood transfusion (Completed)  Antibiotics: None  Assessment/Plan: Active Problems:   Depression   Sickle cell pain crisis ([name])   Leukocytosis   Anemia of chronic disease   Chronic anemia   History of sickle cell disease   Headache   Hb Sickle Cell Disease with Pain  crisis: Continue IVF 0.45% Saline @ KVO, continue weight based Dilaudid  PCA, IV Toradol  15 mg Q 6 H for a total of 5 days, continue oral home pain medications as ordered. Monitor vitals very closely, Re-evaluate pain scale regularly, 2 L of Oxygen  by [place]. Patient encouraged to ambulate on the hallway today.  Leukocytosis: Stable. Anemia of Chronic Disease: Hgb increased to 8.7 after transfusing 1 unit PRBCs. Will continue to monitor daily Cbc Chronic pain Syndrome:Continue oral home pain medication Depression: patient is stable, denies suicidal ideations, continue medications as prescribed.   Headache: Patient has a history of migraine, started patient on Imitrex .   Code Status: Full Code Family Communication: N/A Disposition Plan: Not yet ready for discharge  Homer CHRISTELLA Cover NP   If 7PM-7AM, please contact night-coverage.  10/10/2023, 1:25 PM  LOS: 2 days

## 2023-10-11 LAB — CBC
HCT: 25.8 % — ABNORMAL LOW (ref 36.0–46.0)
Hemoglobin: 9.1 g/dL — ABNORMAL LOW (ref 12.0–15.0)
MCH: 30.6 pg (ref 26.0–34.0)
MCHC: 35.3 g/dL (ref 30.0–36.0)
MCV: 86.9 fL (ref 80.0–100.0)
Platelets: 358 K/uL (ref 150–400)
RBC: 2.97 MIL/uL — ABNORMAL LOW (ref 3.87–5.11)
RDW: 21.3 % — ABNORMAL HIGH (ref 11.5–15.5)
WBC: 10 K/uL (ref 4.0–10.5)
nRBC: 1.3 % — ABNORMAL HIGH (ref 0.0–0.2)

## 2023-10-11 MED ORDER — SENNA 8.6 MG PO TABS
1.0000 | ORAL_TABLET | Freq: Every day | ORAL | Status: DC
  Administered 2023-10-11 – 2023-10-12 (×2): 8.6 mg via ORAL
  Filled 2023-10-11 (×2): qty 1

## 2023-10-11 MED ORDER — SODIUM CHLORIDE 0.9 % IV SOLN
12.5000 mg | Freq: Four times a day (QID) | INTRAVENOUS | Status: DC | PRN
  Administered 2023-10-11: 12.5 mg via INTRAVENOUS
  Filled 2023-10-11: qty 12.5

## 2023-10-11 NOTE — Progress Notes (Signed)
 Patient ID: Alicia Villegas, female   DOB: 05/12/2000, 23 y.o.   MRN: [id number] Subjective: Alicia Villegas is a 23 y.o. female with medical history significant of sickle cell disease, anemia of chronic disease, and chronic pain syndrome presented to emergency department complaining of pain all over and more sores pain in the lower back.   Patient reports improved pain of 6/10 today, headache is improving and mild weakness resolved. Worsening nausea. Denies cough, vomiting and diarrhea. No urinary symptoms.   Objective:  Vital signs in last 24 hours:  Vitals:   10/11/23 0539 10/11/23 0842 10/11/23 1206 10/11/23 1310  BP: 106/60   (!) 95/59  Pulse: 79   70  Resp: 14 15 16 18   Temp: 98.4 F (36.9 C)   98.3 F (36.8 C)  TempSrc: Oral   Oral  SpO2: 99% 99% 98% 93%  Weight:      Height:        Intake/Output from previous day:   Intake/Output Summary (Last 24 hours) at 10/11/2023 1527 Last data filed at 10/11/2023 1507 Gross per 24 hour  Intake 2835.44 ml  Output --  Net 2835.44 ml    Physical Exam: General: Alert, awake, oriented x3, in no acute distress.  HEENT: [place]/AT PEERL, EOMI Neck: Trachea midline,  no masses, no thyromegal,y no JVD, no carotid bruit OROPHARYNX:  Moist, No exudate/ erythema/lesions.  Heart: Regular rate and rhythm, without murmurs, rubs, gallops, PMI non-displaced, no heaves or thrills on palpation.  Lungs: Clear to auscultation, no wheezing or rhonchi noted. No increased vocal fremitus resonant to percussion  Abdomen: Soft, nontender, nondistended, positive bowel sounds, no masses no hepatosplenomegaly noted..  Neuro: No focal neurological deficits noted cranial nerves II through XII grossly intact. DTRs 2+ bilaterally upper and lower extremities. Strength 5 out of 5 in bilateral upper and lower extremities. Musculoskeletal: Generalize body tenderness Psychiatric: Patient alert and oriented x3, good insight and cognition, good recent to remote recall. Lymph node  survey: No cervical axillary or inguinal lymphadenopathy noted.  Lab Results:  Basic Metabolic Panel:    Component Value Date/Time   NA 138 10/08/2023 0635   NA 140 10/23/2022 1047   K 4.0 10/08/2023 0635   CL 107 10/08/2023 0635   CO2 18 (L) 10/08/2023 0635   BUN 8 10/08/2023 0635   BUN 10 10/23/2022 1047   CREATININE 0.36 (L) 10/08/2023 0635   CREATININE 0.30 (L) 05/02/2015 1442   GLUCOSE 91 10/08/2023 0635   CALCIUM 9.0 10/08/2023 0635   CBC:    Component Value Date/Time   WBC 10.0 10/11/2023 0544   HGB 9.1 (L) 10/11/2023 0544   HGB 7.5 (L) 12/28/2022 1335   HCT 25.8 (L) 10/11/2023 0544   HCT 21.8 (L) 12/28/2022 1335   PLT 358 10/11/2023 0544   PLT 408 12/28/2022 1335   MCV 86.9 10/11/2023 0544   MCV 101 (H) 12/28/2022 1335   NEUTROABS 6.6 10/07/2023 2250   NEUTROABS 6.5 10/23/2022 1047   LYMPHSABS 4.4 (H) 10/07/2023 2250   LYMPHSABS 4.2 (H) 10/23/2022 1047   MONOABS 1.1 (H) 10/07/2023 2250   EOSABS 0.2 10/07/2023 2250   EOSABS 0.1 10/23/2022 1047   BASOSABS 0.1 10/07/2023 2250   BASOSABS 0.1 10/23/2022 1047    No results found for this or any previous visit (from the past 240 hours).  Studies/Results: No results found.   Medications: Scheduled Meds:  folic acid   1 mg Oral Daily   heparin   5,000 Units Subcutaneous Q8H   HYDROmorphone   Intravenous Q4H   hydroxyurea   1,000 mg Oral Daily   senna  1 tablet Oral Daily   sodium chloride  flush  3 mL Intravenous Q12H   sodium chloride  flush  3 mL Intravenous Q12H   Continuous Infusions:  promethazine  (PHENERGAN ) injection (IM or IVPB) 12.5 mg (10/11/23 1425)   PRN Meds:.acetaminophen  **OR** acetaminophen , diphenhydrAMINE , methocarbamol  (ROBAXIN ) injection, naloxone  **AND** sodium chloride  flush, mouth rinse, promethazine  (PHENERGAN ) injection (IM or IVPB), sodium chloride  flush  Consultants: None  Procedures: Blood transfusion (Completed)  Antibiotics: None  Assessment/Plan: Active Problems:    Depression   Sickle cell pain crisis ([name])   Leukocytosis   Anemia of chronic disease   Chronic anemia   History of sickle cell disease   Headache   Hb Sickle Cell Disease with Pain crisis: Continue IVF 0.45% Saline @ KVO, continue weight based Dilaudid  PCA, IV Toradol  15 mg Q 6 H for a total of 5 days, continue oral home pain medications as ordered. Monitor vitals very closely, Re-evaluate pain scale regularly, 2 L of Oxygen  by [place]. Patient encouraged to ambulate on the hallway today.  Leukocytosis: Stable. Anemia of Chronic Disease: Hgb increased to 9.1 after transfusing 1 unit PRBCs yesterday. Will continue to monitor daily cbc. Chronic pain Syndrome:Continue oral home pain medication Depression: patient is stable, denies suicidal ideations, continue medications as prescribed.   Headache: Patient has a history of migraine, started patient on Imitrex . CT head without contrast.    Code Status: Full Code Family Communication: N/A Disposition Plan: patient will be ready for discharge in the AM after CT results.   Homer CHRISTELLA Cover NP   If 7PM-7AM, please contact night-coverage.  10/11/2023, 3:27 PM  LOS: 3 days

## 2023-10-12 ENCOUNTER — Inpatient Hospital Stay (HOSPITAL_COMMUNITY)

## 2023-10-12 DIAGNOSIS — D638 Anemia in other chronic diseases classified elsewhere: Secondary | ICD-10-CM | POA: Diagnosis not present

## 2023-10-12 DIAGNOSIS — D5700 Hb-SS disease with crisis, unspecified: Secondary | ICD-10-CM | POA: Diagnosis not present

## 2023-10-12 MED ORDER — METHOCARBAMOL 1000 MG/10ML IJ SOLN: 500.0000 mg | Freq: Four times a day (QID) | INTRAMUSCULAR | 0 refills | Status: DC | PRN

## 2023-10-12 MED ORDER — BUTALBITAL-APAP-CAFFEINE 50-325-40 MG PO TABS: 1.0000 | ORAL_TABLET | Freq: Four times a day (QID) | ORAL | 0 refills | Status: AC | PRN

## 2023-10-12 MED ORDER — SUMATRIPTAN SUCCINATE 50 MG PO TABS
50.0000 mg | ORAL_TABLET | Freq: Once | ORAL | Status: AC
  Administered 2023-10-12: 50 mg via ORAL
  Filled 2023-10-12: qty 1

## 2023-10-12 NOTE — Discharge Summary (Signed)
 Physician Discharge Summary   Patient: Alicia Villegas MRN: [id number] DOB: [birth year]  Admit date:     10/07/2023  Discharge date: {dischdate:26783}  Discharge Physician: SIM KNOLL   PCP: Tilford Bertram HERO, FNP   Recommendations at discharge:  {Tip this will not be part of the note when signed- Example include specific recommendations for outpatient follow-up, pending tests to follow-up on. (Optional):26781}  ***  Discharge Diagnoses: Active Problems:   Depression   Sickle cell pain crisis ([name])   Leukocytosis   Anemia of chronic disease   Chronic anemia   History of sickle cell disease   Headache  Resolved Problems:   * No resolved hospital problems. [facility] Course: No notes on file  Assessment and Plan: No notes have been filed under this hospital service. Service: Hospitalist     {Tip this will not be part of the note when signed Body mass index is 20.97 kg/m. , ,  (Optional):26781}  {(NOTE) Pain control PDMP Statment (Optional):26782} Consultants: *** Procedures performed: ***  Disposition: {Plan; Disposition:26390} Diet recommendation:  Discharge Diet Orders (From admission, onward)     Start     Ordered   10/12/23 0000  Diet - low sodium heart healthy        10/12/23 1215           {Diet_Plan:26776} DISCHARGE MEDICATION: Allergies as of 10/12/2023       Reactions   Ketamine Nausea Only        Medication List     TAKE these medications    acetaminophen  500 MG tablet Commonly known as: TYLENOL  Take 500-1,000 mg by mouth every 6 (six) hours as needed for mild pain (pain score 1-3) or moderate pain (pain score 4-6).   butalbital -acetaminophen -caffeine  50-325-40 MG tablet Commonly known as: FIORICET Take 1-2 tablets by mouth every 6 (six) hours as needed for headache.   EXCEDRIN PO Take 2 tablets by mouth daily as needed.   folic acid  1 MG tablet Commonly known as: FOLVITE  Take 1 tablet (1 mg total) by mouth daily.    HYDROmorphone  2 MG tablet Commonly known as: Dilaudid  Take 1 tablet (2 mg total) by mouth every 4 (four) hours as needed for severe pain (pain score 7-10).   hydroxyurea  100 mg/mL Susp Commonly known as: HYDREA  Take 1,000 mg by mouth daily.   ibuprofen  200 MG tablet Commonly known as: ADVIL  Take 200-800 mg by mouth every 6 (six) hours as needed for mild pain (pain score 1-3) or headache.   methocarbamol  1000 MG/10ML injection Commonly known as: ROBAXIN  Inject 5 mLs (500 mg total) into the vein every 6 (six) hours as needed.   sterile water   Vitamin D3 25 MCG (1000 UT) Caps Take 1 capsule (1,000 Units total) by mouth daily.        Discharge Exam: Filed Weights   10/07/23 2227  Weight: 50.3 kg   ***  Condition at discharge: {DC Condition:26389}  The results of significant diagnostics from this hospitalization (including imaging, microbiology, ancillary and laboratory) are listed below for reference.   Imaging Studies: CT HEAD WO CONTRAST ( ) Result Date: 10/12/2023 EXAM: CT HEAD WITHOUT CONTRAST 10/12/2023 09:57:45 AM TECHNIQUE: CT of the head was performed without the administration of intravenous contrast. Automated exposure control, iterative reconstruction, and/or weight based adjustment of the mA/kV was utilized to reduce the radiation dose to as low as reasonably achievable. COMPARISON: 06/26/2016 CLINICAL HISTORY: Headache, classic migraine. Scan due to headache. Classic migraine. FINDINGS: BRAIN AND VENTRICLES: No acute  hemorrhage. No evidence of acute infarct. No hydrocephalus. No extra-axial collection. No mass effect or midline shift. ORBITS: No acute abnormality. SINUSES: No acute abnormality. SOFT TISSUES AND SKULL: No acute soft tissue abnormality. No skull fracture. IMPRESSION: 1. No acute intracranial abnormality. Electronically signed by: Evalene Coho MD 10/12/2023 10:17 AM EDT RP Workstation: [id number]   DG Chest 2 View Result Date: 10/08/2023 CLINICAL  DATA:  Sickle cell pain EXAM: CHEST - 2 VIEW COMPARISON:  02/18/2023 FINDINGS: Cardiac shadow is stable. Lungs are well aerated bilaterally. No focal infiltrate or effusion is seen. No bony abnormality is noted. Mild scoliosis in the midthoracic spine is seen. IMPRESSION: No acute abnormality noted. Electronically Signed   By: Oneil Devonshire M.D.   On: 10/08/2023 00:04    Microbiology: Results for orders placed or performed during the hospital encounter of 10/12/22  Culture, blood (Routine X 2) w Reflex to ID Panel     Status: None   Collection Time: 10/12/22  7:15 PM   Specimen: BLOOD LEFT ARM  Result Value Ref Range Status   Specimen Description   Final    BLOOD LEFT ARM Performed at [facility], [address] [place], [place] [address]    Special Requests   Final    BOTTLES DRAWN AEROBIC AND ANAEROBIC Blood Culture adequate volume Performed at [facility], [address] [place], [place] [address]    Culture   Final    NO GROWTH 5 DAYS Performed at [facility], [address] [place], [place] [address]    Report Status 10/17/2022 FINAL  Final  Culture, blood (Routine X 2) w Reflex to ID Panel     Status: None   Collection Time: 10/12/22  7:15 PM   Specimen: BLOOD LEFT ARM  Result Value Ref Range Status   Specimen Description   Final    BLOOD LEFT ARM Performed at [facility], [address], [place], [place] [address]    Special Requests   Final    BOTTLES DRAWN AEROBIC AND ANAEROBIC Blood Culture adequate volume Performed at [facility], [address], [place], [place] [address]    Culture   Final    NO GROWTH 5 DAYS Performed at [facility], [address] [place], [place] [address]    Report Status 10/17/2022 FINAL  Final    Labs: CBC: Recent Labs  Lab 10/07/23 2250 10/08/23 0635 10/09/23 0426 10/09/23 2213 10/10/23 0634 10/11/23 0544  WBC 12.6* 15.8* 8.9  --  9.0 10.0  NEUTROABS 6.6  --    --   --   --   --   HGB 7.2* 6.7* 6.6* 9.5* 8.7* 9.1*  HCT 20.5* 19.4* 17.9* 28.4* 23.9* 25.8*  MCV 91.5 93.3 89.9  --  85.4 86.9  PLT 368 331 319  --  325 358   Basic Metabolic Panel: Recent Labs  Lab 10/07/23 2250 10/08/23 0635  NA 143 138  K 4.1 4.0  CL 110 107  CO2 18* 18*  GLUCOSE 87 91  BUN 10 8  CREATININE 0.44 0.36*  CALCIUM 9.4 9.0   Liver Function Tests: Recent Labs  Lab 10/07/23 2250 10/08/23 0635  AST 42* 42*  ALT 9 9  ALKPHOS 92 87  BILITOT 2.5* 2.0*  PROT 7.4 6.9  ALBUMIN 4.6 4.2   CBG: No results for input(s): GLUCAP in the last 168 hours.  Discharge time spent: {LESS THAN/GREATER UYJW:73611} 30 minutes.  SignedBETHA SIM KNOLL, MD [facility] 10/12/2023

## 2023-10-13 NOTE — Hospital Course (Signed)
 Patient was admitted with sickle cell pain crisis.  Also having headache she suspected to be migraine headaches.  She was treated with IV Dilaudid , Toradol , IV fluids, also oral oxycodone .  Patient has improved on home regimen.  She was given Fioricet for her migraine headaches which assisted her in the hospital.  She also has Imitrex  while in hospital.  Patient was doing better.  Patient transition to home regimen and discharged home to follow-up with PCP.

## 2023-10-14 ENCOUNTER — Telehealth: Payer: Self-pay

## 2023-10-14 NOTE — Transitions of Care (Post Inpatient/ED Visit) (Signed)
   10/14/2023  Name: Janazia Mundie MRN: [id number] DOB: 2000-03-09  Today's TOC FU Call Status: Today's TOC FU Call Status:: Unsuccessful Call (1st Attempt) Unsuccessful Call (1st Attempt) Date: 10/14/23  Attempted to reach the patient regarding the most recent Inpatient/ED visit.  Follow Up Plan: Additional outreach attempts will be made to reach the patient to complete the Transitions of Care (Post Inpatient/ED visit) call.   Arvin Seip RN, BSN, CCM [employer], Population Health Case Manager Phone: [phone]

## 2023-10-15 ENCOUNTER — Telehealth: Payer: Self-pay

## 2023-10-15 NOTE — Transitions of Care (Post Inpatient/ED Visit) (Signed)
   10/15/2023  Name: Alicia Villegas MRN: [id number] DOB: July 15, 2000  Today's TOC FU Call Status: Today's TOC FU Call Status:: Unsuccessful Call (2nd Attempt) Unsuccessful Call (2nd Attempt) Date: 10/15/23  Attempted to reach the patient regarding the most recent Inpatient/ED visit.  Follow Up Plan: Additional outreach attempts will be made to reach the patient to complete the Transitions of Care (Post Inpatient/ED visit) call.   Arvin Seip RN, BSN, CCM [employer], Population Health Case Manager Phone: [phone]

## 2023-10-16 ENCOUNTER — Telehealth: Payer: Self-pay

## 2023-10-16 NOTE — Transitions of Care (Post Inpatient/ED Visit) (Signed)
   10/16/2023  Name: Alicia Villegas MRN: [id number] DOB: Dec 02, 2000  Today's TOC FU Call Status: Today's TOC FU Call Status:: Unsuccessful Call (3rd Attempt) Unsuccessful Call (3rd Attempt) Date: 10/16/23  Attempted to reach the patient regarding the most recent Inpatient/ED visit.  Follow Up Plan: No further outreach attempts will be made at this time. We have been unable to contact the patient.  Arvin Seip RN, BSN, CCM [employer], Population Health Case Manager Phone: [phone]

## 2023-11-14 ENCOUNTER — Telehealth: Payer: Self-pay

## 2023-11-14 NOTE — Progress Notes (Unsigned)
 Complex Care Management Note Care Guide Note  11/14/2023 Name: Alicia Villegas MRN: [id number] DOB: 07-08-2000   Complex Care Management Outreach Attempts: An unsuccessful telephone outreach was attempted today to offer the patient information about available complex care management services.  Follow Up Plan:  Additional outreach attempts will be made to offer the patient complex care management information and services.   Encounter Outcome:  Call Answered/ Requested to call back  Leotis Rase [facility], Outpatient [facility] Guide  Direct Dial: [phone]  Fax [phone]

## 2023-11-20 ENCOUNTER — Encounter: Payer: Self-pay | Admitting: Family

## 2023-11-20 ENCOUNTER — Ambulatory Visit: Payer: Self-pay | Admitting: Family

## 2023-11-20 VITALS — BP 100/71 | HR 84 | Temp 96.1°F | Resp 16

## 2023-11-20 DIAGNOSIS — B001 Herpesviral vesicular dermatitis: Secondary | ICD-10-CM

## 2023-11-20 DIAGNOSIS — R059 Cough, unspecified: Secondary | ICD-10-CM

## 2023-11-20 NOTE — Progress Notes (Signed)
  Subjective:     Patient ID: Alicia Villegas, female   DOB: [birth year], 23 y.o.   MRN: [id number]  Had URI 2-3 weeks ago, symptoms improved but cough with minimal sputum production remains  Cough This is a chronic problem. The current episode started 1 to 4 weeks ago. The problem has been gradually improving. The problem occurs every few hours. The cough is Productive of sputum. Pertinent negatives include no sore throat or wheezing. She has tried OTC cough suppressant for the symptoms. The treatment provided mild relief. PMH includes sickle cell     Review of Systems  Constitutional:  Positive for fatigue.  HENT:  Positive for mouth sores. Negative for sore throat.   Respiratory:  Positive for cough. Negative for wheezing.   All other systems reviewed and are negative.      Objective:   Physical Exam Constitutional:      Appearance: Normal appearance.  HENT:     Mouth/Throat:     Mouth: Mucous membranes are dry.  Cardiovascular:     Rate and Rhythm: Normal rate and regular rhythm.  Pulmonary:     Effort: Pulmonary effort is normal. No respiratory distress.     Breath sounds: Normal breath sounds. No wheezing, rhonchi or rales.  Neurological:     Mental Status: She is alert.        Assessment:     Recent history of viral URI, she continues to have occasional cough with sputum production.  She has noticed small amount of blood a few times. Cold sore in late stages of healing also noted on right side of upper lip.     Plan:     I assured her that ongoing cough and congestion is a normal finding in weeks post URI. As long as her symptoms are improving, her body is healing. If her symptoms return or worsen she should RTC to be seen.    Gave her mucinex and encouraged increased water intake.

## 2024-01-13 DIAGNOSIS — D5700 Hb-SS disease with crisis, unspecified: Secondary | ICD-10-CM | POA: Diagnosis not present

## 2024-01-13 DIAGNOSIS — G43909 Migraine, unspecified, not intractable, without status migrainosus: Secondary | ICD-10-CM | POA: Diagnosis not present

## 2024-01-13 DIAGNOSIS — R531 Weakness: Secondary | ICD-10-CM | POA: Diagnosis not present

## 2024-01-13 DIAGNOSIS — I517 Cardiomegaly: Secondary | ICD-10-CM | POA: Diagnosis not present

## 2024-01-13 DIAGNOSIS — R079 Chest pain, unspecified: Secondary | ICD-10-CM | POA: Diagnosis not present

## 2024-01-14 DIAGNOSIS — D5700 Hb-SS disease with crisis, unspecified: Secondary | ICD-10-CM | POA: Diagnosis not present

## 2024-01-14 DIAGNOSIS — R590 Localized enlarged lymph nodes: Secondary | ICD-10-CM | POA: Diagnosis not present

## 2024-01-14 DIAGNOSIS — Z9049 Acquired absence of other specified parts of digestive tract: Secondary | ICD-10-CM | POA: Diagnosis not present

## 2024-01-14 DIAGNOSIS — Z9081 Acquired absence of spleen: Secondary | ICD-10-CM | POA: Diagnosis not present

## 2024-01-16 DIAGNOSIS — D5700 Hb-SS disease with crisis, unspecified: Secondary | ICD-10-CM | POA: Diagnosis not present

## 2024-01-24 DIAGNOSIS — I959 Hypotension, unspecified: Secondary | ICD-10-CM | POA: Diagnosis not present

## 2024-01-24 DIAGNOSIS — D5700 Hb-SS disease with crisis, unspecified: Secondary | ICD-10-CM | POA: Diagnosis not present

## 2024-01-24 DIAGNOSIS — R42 Dizziness and giddiness: Secondary | ICD-10-CM | POA: Diagnosis not present

## 2024-01-24 DIAGNOSIS — Z8679 Personal history of other diseases of the circulatory system: Secondary | ICD-10-CM | POA: Diagnosis not present

## 2024-01-24 DIAGNOSIS — R110 Nausea: Secondary | ICD-10-CM | POA: Diagnosis not present

## 2024-01-24 DIAGNOSIS — L299 Pruritus, unspecified: Secondary | ICD-10-CM | POA: Diagnosis not present

## 2024-01-24 DIAGNOSIS — R0682 Tachypnea, not elsewhere classified: Secondary | ICD-10-CM | POA: Diagnosis not present

## 2024-01-24 DIAGNOSIS — K219 Gastro-esophageal reflux disease without esophagitis: Secondary | ICD-10-CM | POA: Diagnosis not present

## 2024-01-24 DIAGNOSIS — R000 Tachycardia, unspecified: Secondary | ICD-10-CM | POA: Diagnosis not present

## 2024-01-24 DIAGNOSIS — Z8611 Personal history of tuberculosis: Secondary | ICD-10-CM | POA: Diagnosis not present

## 2024-01-24 DIAGNOSIS — R0789 Other chest pain: Secondary | ICD-10-CM | POA: Diagnosis not present

## 2024-01-24 DIAGNOSIS — Z8619 Personal history of other infectious and parasitic diseases: Secondary | ICD-10-CM | POA: Diagnosis not present

## 2024-01-24 DIAGNOSIS — Z9081 Acquired absence of spleen: Secondary | ICD-10-CM | POA: Diagnosis not present

## 2024-01-24 DIAGNOSIS — K5900 Constipation, unspecified: Secondary | ICD-10-CM | POA: Diagnosis not present

## 2024-01-24 DIAGNOSIS — Z79899 Other long term (current) drug therapy: Secondary | ICD-10-CM | POA: Diagnosis not present
